# Patient Record
Sex: Male | Born: 1950 | Race: White | Hispanic: No | Marital: Married | Smoking: Former smoker
Health system: Southern US, Community
[De-identification: ages and names within clinical notes are randomized; demographics above are authoritative.]

## PROBLEM LIST (undated history)

## (undated) DIAGNOSIS — M5417 Radiculopathy, lumbosacral region: Secondary | ICD-10-CM

## (undated) DIAGNOSIS — L851 Acquired keratosis [keratoderma] palmaris et plantaris: Secondary | ICD-10-CM

## (undated) DIAGNOSIS — K449 Diaphragmatic hernia without obstruction or gangrene: Secondary | ICD-10-CM

## (undated) DIAGNOSIS — L84 Corns and callosities: Secondary | ICD-10-CM

## (undated) DIAGNOSIS — I951 Orthostatic hypotension: Secondary | ICD-10-CM

## (undated) DIAGNOSIS — K219 Gastro-esophageal reflux disease without esophagitis: Secondary | ICD-10-CM

## (undated) DIAGNOSIS — E119 Type 2 diabetes mellitus without complications: Secondary | ICD-10-CM

## (undated) DIAGNOSIS — R600 Localized edema: Secondary | ICD-10-CM

## (undated) DIAGNOSIS — M545 Low back pain: Secondary | ICD-10-CM

## (undated) DIAGNOSIS — G4733 Obstructive sleep apnea (adult) (pediatric): Secondary | ICD-10-CM

## (undated) DIAGNOSIS — H409 Unspecified glaucoma: Secondary | ICD-10-CM

## (undated) DIAGNOSIS — M1990 Unspecified osteoarthritis, unspecified site: Secondary | ICD-10-CM

## (undated) DIAGNOSIS — E782 Mixed hyperlipidemia: Secondary | ICD-10-CM

## (undated) DIAGNOSIS — I10 Essential (primary) hypertension: Secondary | ICD-10-CM

## (undated) DIAGNOSIS — M47816 Spondylosis without myelopathy or radiculopathy, lumbar region: Secondary | ICD-10-CM

## (undated) DIAGNOSIS — M7020 Olecranon bursitis, unspecified elbow: Secondary | ICD-10-CM

## (undated) DIAGNOSIS — I4819 Other persistent atrial fibrillation: Secondary | ICD-10-CM

## (undated) DIAGNOSIS — I701 Atherosclerosis of renal artery: Secondary | ICD-10-CM

## (undated) DIAGNOSIS — K2270 Barrett's esophagus without dysplasia: Secondary | ICD-10-CM

## (undated) DIAGNOSIS — M5136 Other intervertebral disc degeneration, lumbar region: Secondary | ICD-10-CM

## (undated) DIAGNOSIS — Z79899 Other long term (current) drug therapy: Secondary | ICD-10-CM

## (undated) HISTORY — DX: Spondylosis without myelopathy or radiculopathy, lumbar region: M47.816

## (undated) HISTORY — DX: Corns and callosities: L84

## (undated) HISTORY — DX: Olecranon bursitis, unspecified elbow: M70.20

## (undated) HISTORY — DX: Mixed hyperlipidemia: E78.2

## (undated) HISTORY — DX: Atherosclerosis of renal artery: I70.1

## (undated) HISTORY — DX: Unspecified glaucoma: H40.9

## (undated) HISTORY — DX: Radiculopathy, lumbosacral region: M54.17

## (undated) HISTORY — DX: Low back pain: M54.5

## (undated) HISTORY — DX: Localized edema: R60.0

## (undated) HISTORY — DX: Acquired keratosis (keratoderma) palmaris et plantaris: L85.1

## (undated) HISTORY — DX: Other intervertebral disc degeneration, lumbar region: M51.36

## (undated) HISTORY — DX: Obstructive sleep apnea (adult) (pediatric): G47.33

## (undated) HISTORY — DX: Orthostatic hypotension: I95.1

## (undated) HISTORY — DX: Barrett's esophagus without dysplasia: K22.70

## (undated) HISTORY — DX: Other long term (current) drug therapy: Z79.899

## (undated) HISTORY — DX: Other persistent atrial fibrillation: I48.19

## (undated) HISTORY — DX: Diaphragmatic hernia without obstruction or gangrene: K44.9

---

## 2004-11-17 ENCOUNTER — Ambulatory Visit: Payer: Self-pay | Admitting: Family Medicine

## 2004-11-27 ENCOUNTER — Ambulatory Visit: Payer: Self-pay | Admitting: Family Medicine

## 2004-12-09 ENCOUNTER — Ambulatory Visit: Payer: Self-pay | Admitting: Family Medicine

## 2004-12-31 ENCOUNTER — Ambulatory Visit: Payer: Self-pay | Admitting: Family Medicine

## 2005-01-14 ENCOUNTER — Ambulatory Visit: Payer: Self-pay | Admitting: Family Medicine

## 2005-02-18 ENCOUNTER — Ambulatory Visit: Payer: Self-pay | Admitting: Family Medicine

## 2005-05-28 ENCOUNTER — Ambulatory Visit: Payer: Self-pay | Admitting: Family Medicine

## 2005-07-23 ENCOUNTER — Ambulatory Visit: Payer: Self-pay | Admitting: Family Medicine

## 2005-08-11 ENCOUNTER — Ambulatory Visit: Payer: Self-pay | Admitting: Family Medicine

## 2012-07-22 DIAGNOSIS — M47816 Spondylosis without myelopathy or radiculopathy, lumbar region: Secondary | ICD-10-CM

## 2012-07-22 DIAGNOSIS — M5417 Radiculopathy, lumbosacral region: Secondary | ICD-10-CM | POA: Insufficient documentation

## 2012-07-22 DIAGNOSIS — M545 Low back pain, unspecified: Secondary | ICD-10-CM

## 2012-07-22 DIAGNOSIS — M5136 Other intervertebral disc degeneration, lumbar region: Secondary | ICD-10-CM

## 2012-07-22 DIAGNOSIS — M51369 Other intervertebral disc degeneration, lumbar region without mention of lumbar back pain or lower extremity pain: Secondary | ICD-10-CM

## 2012-07-22 HISTORY — DX: Low back pain, unspecified: M54.50

## 2012-07-22 HISTORY — DX: Radiculopathy, lumbosacral region: M54.17

## 2012-07-22 HISTORY — DX: Other intervertebral disc degeneration, lumbar region: M51.36

## 2012-07-22 HISTORY — DX: Other intervertebral disc degeneration, lumbar region without mention of lumbar back pain or lower extremity pain: M51.369

## 2012-07-22 HISTORY — DX: Spondylosis without myelopathy or radiculopathy, lumbar region: M47.816

## 2012-09-13 ENCOUNTER — Other Ambulatory Visit: Payer: Self-pay | Admitting: Neurosurgery

## 2012-09-13 ENCOUNTER — Encounter (HOSPITAL_COMMUNITY): Payer: Self-pay | Admitting: Pharmacy Technician

## 2012-09-19 ENCOUNTER — Encounter (HOSPITAL_COMMUNITY): Payer: Self-pay

## 2012-09-19 ENCOUNTER — Ambulatory Visit (HOSPITAL_COMMUNITY)
Admission: RE | Admit: 2012-09-19 | Discharge: 2012-09-19 | Disposition: A | Discharge status: Home / Self Care | Payer: PRIVATE HEALTH INSURANCE | Source: Ambulatory Visit | Attending: Neurosurgery | Admitting: Neurosurgery

## 2012-09-19 ENCOUNTER — Encounter (HOSPITAL_COMMUNITY)
Admission: RE | Admit: 2012-09-19 | Discharge: 2012-09-19 | Disposition: A | Discharge status: Home / Self Care | Payer: PRIVATE HEALTH INSURANCE | Source: Ambulatory Visit | Attending: Neurosurgery | Admitting: Neurosurgery

## 2012-09-19 DIAGNOSIS — Z01810 Encounter for preprocedural cardiovascular examination: Secondary | ICD-10-CM | POA: Insufficient documentation

## 2012-09-19 DIAGNOSIS — Z01818 Encounter for other preprocedural examination: Secondary | ICD-10-CM | POA: Insufficient documentation

## 2012-09-19 DIAGNOSIS — I4510 Unspecified right bundle-branch block: Secondary | ICD-10-CM | POA: Insufficient documentation

## 2012-09-19 DIAGNOSIS — Z01812 Encounter for preprocedural laboratory examination: Secondary | ICD-10-CM | POA: Insufficient documentation

## 2012-09-19 DIAGNOSIS — I491 Atrial premature depolarization: Secondary | ICD-10-CM | POA: Insufficient documentation

## 2012-09-19 HISTORY — DX: Essential (primary) hypertension: I10

## 2012-09-19 HISTORY — DX: Type 2 diabetes mellitus without complications: E11.9

## 2012-09-19 HISTORY — DX: Gastro-esophageal reflux disease without esophagitis: K21.9

## 2012-09-19 HISTORY — DX: Unspecified osteoarthritis, unspecified site: M19.90

## 2012-09-19 LAB — CBC
HCT: 43.3 % (ref 39.0–52.0)
MCH: 30.3 pg (ref 26.0–34.0)
MCHC: 33.5 g/dL (ref 30.0–36.0)
MCV: 90.6 fL (ref 78.0–100.0)
Platelets: 228 10*3/uL (ref 150–400)
RDW: 12.5 % (ref 11.5–15.5)
WBC: 8 10*3/uL (ref 4.0–10.5)

## 2012-09-19 LAB — BASIC METABOLIC PANEL
BUN: 15 mg/dL (ref 6–23)
Calcium: 9.6 mg/dL (ref 8.4–10.5)
Chloride: 98 mEq/L (ref 96–112)
Creatinine, Ser: 0.89 mg/dL (ref 0.50–1.35)
GFR calc Af Amer: >90 mL/min (ref >90)

## 2012-09-19 NOTE — Progress Notes (Signed)
Dr Bettina Gavia in [place] called for any past cardiac records

## 2012-09-19 NOTE — Pre-Procedure Instructions (Signed)
KERVIN SWINGLER  09/19/2012   Your procedure is scheduled on:  09/22/12  Report to [facility] at 1030am  Call this number if you have problems the morning of surgery: [phone]   Remember:   Do not eat food or drink liquids after midnight.   Take these medicines the morning of surgery with A SIP OF WATER: norvasc,carvedilol,cardura,hydralazine,prilosec,percocet   Do not wear jewelry, make-up or nail polish.  Do not wear lotions, powders, or perfumes. You may wear deodorant.  Do not shave 48 hours prior to surgery. Men may shave face and neck.  Do not bring valuables to the hospital.  Contacts, dentures or bridgework may not be worn into surgery.  Leave suitcase in the car. After surgery it may be brought to your room.  For patients admitted to the hospital, checkout time is 11:00 AM the day of  discharge.   Patients discharged the day of surgery will not be allowed to drive  home.  Name and phone number of your driver: family  Special Instructions: Shower using CHG 2 nights before surgery and the night before surgery.  If you shower the day of surgery use CHG.  Use special wash - you have one bottle of CHG for all showers.  You should use approximately 1/3 of the bottle for each shower.   Please read over the following fact sheets that you were given: Pain Booklet, Coughing and Deep Breathing, MRSA Information and Surgical Site Infection Prevention

## 2012-09-20 NOTE — Consult Note (Signed)
Anesthesia chart review: Patient is a 62 year old male scheduled for L2-3 laminectomy by Dr. Arnoldo Morale on 09/22/2012. History includes former smoker, obesity, diabetes mellitus type 2, hypertension, GERD, arthritis.  PCP is Dr. Garlon Hatchet.  Cardiologist is Dr. Bettina Gavia ([place]), last visit was on 08/22/12.  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  BP was 164/76 at PAT. Patient denies known history of stress or echo.  Preoperative labs noted.  Glucose 124.  CXR on 09/19/12 showed no acute abnormality.  EKG on 09/19/12 showed SR with PACs, incomplete right BBB. Overall, I think it is stable since 01/28/11.  Patient denies chest pain, SOB.  His activity has been very limited since 06/2012.  Prior to that he could walk up stairs, do yard work, put up [employer] without difficulty.  He got LE edema when started on Lyrica, but this resolved once it was discontinued.  Renal ultrasound on 08/26/11 showed no evidence of significant renal artery stenoses bilaterally, < 60% and no evidence of infrarenal AAA.    I reviewed above with Anesthesiologist Dr. Linna Caprice.  Patient has known HTN and DM, but no known CAD.  He has been asymptomatic and with reasonable activity tolerance prior to back pain in 06/2012.  Patient will be evaluated by his assigned anesthesiologist on the day of surgery.  If no new CV symptoms then would anticipate he could proceed as planned.   Myra Gianotti, PA-C 09/20/12 1640

## 2012-09-21 MED ORDER — CEFAZOLIN SODIUM-DEXTROSE 2-3 GM-% IV SOLR
2.0000 g | INTRAVENOUS | Status: AC
  Administered 2012-09-22: 2 g via INTRAVENOUS
  Filled 2012-09-21: qty 50

## 2012-09-22 ENCOUNTER — Ambulatory Visit (HOSPITAL_COMMUNITY): Payer: PRIVATE HEALTH INSURANCE | Admitting: *Deleted

## 2012-09-22 ENCOUNTER — Ambulatory Visit (HOSPITAL_COMMUNITY): Payer: PRIVATE HEALTH INSURANCE

## 2012-09-22 ENCOUNTER — Encounter (HOSPITAL_COMMUNITY): Payer: Self-pay | Admitting: Vascular Surgery

## 2012-09-22 ENCOUNTER — Encounter (HOSPITAL_COMMUNITY)
Admission: RE | Disposition: A | Discharge status: Home / Self Care | Payer: Self-pay | Source: Ambulatory Visit | Attending: Neurosurgery

## 2012-09-22 ENCOUNTER — Ambulatory Visit (HOSPITAL_COMMUNITY)
Admission: RE | Admit: 2012-09-22 | Discharge: 2012-09-23 | Disposition: A | Discharge status: Home / Self Care | Payer: PRIVATE HEALTH INSURANCE | Source: Ambulatory Visit | Attending: Neurosurgery | Admitting: Neurosurgery

## 2012-09-22 ENCOUNTER — Encounter (HOSPITAL_COMMUNITY): Payer: Self-pay | Admitting: *Deleted

## 2012-09-22 DIAGNOSIS — M5126 Other intervertebral disc displacement, lumbar region: Secondary | ICD-10-CM | POA: Insufficient documentation

## 2012-09-22 DIAGNOSIS — I10 Essential (primary) hypertension: Secondary | ICD-10-CM | POA: Insufficient documentation

## 2012-09-22 DIAGNOSIS — M51379 Other intervertebral disc degeneration, lumbosacral region without mention of lumbar back pain or lower extremity pain: Secondary | ICD-10-CM | POA: Insufficient documentation

## 2012-09-22 DIAGNOSIS — M5137 Other intervertebral disc degeneration, lumbosacral region: Secondary | ICD-10-CM | POA: Insufficient documentation

## 2012-09-22 DIAGNOSIS — M48062 Spinal stenosis, lumbar region with neurogenic claudication: Secondary | ICD-10-CM | POA: Insufficient documentation

## 2012-09-22 DIAGNOSIS — Z23 Encounter for immunization: Secondary | ICD-10-CM | POA: Insufficient documentation

## 2012-09-22 DIAGNOSIS — E119 Type 2 diabetes mellitus without complications: Secondary | ICD-10-CM | POA: Insufficient documentation

## 2012-09-22 HISTORY — PX: LUMBAR LAMINECTOMY/DECOMPRESSION MICRODISCECTOMY: SHX5026

## 2012-09-22 SURGERY — LUMBAR LAMINECTOMY/DECOMPRESSION MICRODISCECTOMY 1 LEVEL
Anesthesia: General | Site: Back | Laterality: Bilateral | Wound class: Clean

## 2012-09-22 MED ORDER — BACITRACIN 50000 UNITS IM SOLR
INTRAMUSCULAR | Status: AC
  Filled 2012-09-22: qty 1

## 2012-09-22 MED ORDER — AMLODIPINE BESYLATE 10 MG PO TABS
10.0000 mg | ORAL_TABLET | Freq: Every day | ORAL | Status: DC
Start: 2012-09-22 — End: 2012-09-23
  Administered 2012-09-22 – 2012-09-23 (×2): 10 mg via ORAL
  Filled 2012-09-22 (×2): qty 1

## 2012-09-22 MED ORDER — METFORMIN HCL 500 MG PO TABS
1000.0000 mg | ORAL_TABLET | Freq: Two times a day (BID) | ORAL | Status: DC
  Administered 2012-09-23: 1000 mg via ORAL
  Filled 2012-09-22 (×3): qty 2

## 2012-09-22 MED ORDER — SPIRONOLACTONE 25 MG PO TABS
25.0000 mg | ORAL_TABLET | Freq: Every day | ORAL | Status: DC
  Administered 2012-09-23: 25 mg via ORAL
  Filled 2012-09-22: qty 1

## 2012-09-22 MED ORDER — ACETAMINOPHEN 325 MG PO TABS: 650.0000 mg | ORAL_TABLET | ORAL | Status: DC | PRN

## 2012-09-22 MED ORDER — ONDANSETRON HCL 4 MG/2ML IJ SOLN
INTRAMUSCULAR | Status: DC | PRN
  Administered 2012-09-22: 4 mg via INTRAVENOUS

## 2012-09-22 MED ORDER — HYDRALAZINE HCL 50 MG PO TABS
50.0000 mg | ORAL_TABLET | Freq: Three times a day (TID) | ORAL | Status: DC
  Administered 2012-09-22 – 2012-09-23 (×2): 50 mg via ORAL
  Filled 2012-09-22 (×4): qty 1

## 2012-09-22 MED ORDER — OXYCODONE-ACETAMINOPHEN 5-325 MG PO TABS: 1.0000 | ORAL_TABLET | ORAL | Status: DC | PRN

## 2012-09-22 MED ORDER — PHENOL 1.4 % MT LIQD: 1.0000 | OROMUCOSAL | Status: DC | PRN

## 2012-09-22 MED ORDER — ONDANSETRON HCL 4 MG/2ML IJ SOLN: 4.0000 mg | INTRAMUSCULAR | Status: DC | PRN

## 2012-09-22 MED ORDER — HYDROCODONE-ACETAMINOPHEN 5-325 MG PO TABS
1.0000 | ORAL_TABLET | ORAL | Status: DC | PRN
  Administered 2012-09-23 (×2): 2 via ORAL
  Filled 2012-09-22 (×2): qty 2

## 2012-09-22 MED ORDER — NEOSTIGMINE METHYLSULFATE 1 MG/ML IJ SOLN
INTRAMUSCULAR | Status: DC | PRN
  Administered 2012-09-22: 5 mg via INTRAVENOUS

## 2012-09-22 MED ORDER — PANTOPRAZOLE SODIUM 40 MG PO TBEC
80.0000 mg | DELAYED_RELEASE_TABLET | Freq: Every day | ORAL | Status: DC
  Administered 2012-09-22 – 2012-09-23 (×2): 80 mg via ORAL
  Filled 2012-09-22 (×2): qty 2

## 2012-09-22 MED ORDER — INSULIN ASPART 100 UNIT/ML ~~LOC~~ SOLN: 0.0000 [IU] | Freq: Every day | SUBCUTANEOUS | Status: DC

## 2012-09-22 MED ORDER — CARVEDILOL 25 MG PO TABS
25.0000 mg | ORAL_TABLET | Freq: Two times a day (BID) | ORAL | Status: DC
  Administered 2012-09-23: 25 mg via ORAL
  Filled 2012-09-22 (×3): qty 1

## 2012-09-22 MED ORDER — CEFAZOLIN SODIUM-DEXTROSE 2-3 GM-% IV SOLR
2.0000 g | Freq: Three times a day (TID) | INTRAVENOUS | Status: AC
  Administered 2012-09-22 – 2012-09-23 (×2): 2 g via INTRAVENOUS
  Filled 2012-09-22 (×2): qty 50

## 2012-09-22 MED ORDER — ZOLPIDEM TARTRATE 5 MG PO TABS: 5.0000 mg | ORAL_TABLET | Freq: Every evening | ORAL | Status: DC | PRN

## 2012-09-22 MED ORDER — HEMOSTATIC AGENTS (NO CHARGE) OPTIME
TOPICAL | Status: DC | PRN
  Administered 2012-09-22: 1 via TOPICAL

## 2012-09-22 MED ORDER — MIDAZOLAM HCL 2 MG/2ML IJ SOLN: 1.0000 mg | INTRAMUSCULAR | Status: DC | PRN

## 2012-09-22 MED ORDER — LACTATED RINGERS IV SOLN: INTRAVENOUS | Status: DC

## 2012-09-22 MED ORDER — BUPIVACAINE-EPINEPHRINE PF 0.5-1:200000 % IJ SOLN
INTRAMUSCULAR | Status: DC | PRN
  Administered 2012-09-22: 20 mL
  Administered 2012-09-22: 10 mL

## 2012-09-22 MED ORDER — PROPOFOL 10 MG/ML IV BOLUS
INTRAVENOUS | Status: DC | PRN
  Administered 2012-09-22: 200 mg via INTRAVENOUS

## 2012-09-22 MED ORDER — MORPHINE SULFATE 2 MG/ML IJ SOLN: 1.0000 mg | INTRAMUSCULAR | Status: DC | PRN

## 2012-09-22 MED ORDER — FENTANYL CITRATE 0.05 MG/ML IJ SOLN
INTRAMUSCULAR | Status: DC | PRN
  Administered 2012-09-22 (×2): 50 ug via INTRAVENOUS
  Administered 2012-09-22: 150 ug via INTRAVENOUS

## 2012-09-22 MED ORDER — COLESEVELAM HCL 3.75 G PO PACK
1.0000 | PACK | Freq: Every day | ORAL | Status: DC
  Administered 2012-09-22: 1 via ORAL
  Filled 2012-09-22 (×2): qty 1

## 2012-09-22 MED ORDER — DIAZEPAM 5 MG PO TABS: 5.0000 mg | ORAL_TABLET | Freq: Four times a day (QID) | ORAL | Status: DC | PRN

## 2012-09-22 MED ORDER — 0.9 % SODIUM CHLORIDE (POUR BTL) OPTIME
TOPICAL | Status: DC | PRN
  Administered 2012-09-22: 1000 mL

## 2012-09-22 MED ORDER — SPIRONOLACTONE-HCTZ 25-25 MG PO TABS: 1.0000 | ORAL_TABLET | Freq: Every day | ORAL | Status: DC

## 2012-09-22 MED ORDER — INSULIN ASPART 100 UNIT/ML ~~LOC~~ SOLN
0.0000 [IU] | Freq: Three times a day (TID) | SUBCUTANEOUS | Status: DC
  Administered 2012-09-23: 4 [IU] via SUBCUTANEOUS

## 2012-09-22 MED ORDER — THROMBIN 5000 UNITS EX SOLR
CUTANEOUS | Status: DC | PRN
  Administered 2012-09-22 (×2): 5000 [IU] via TOPICAL

## 2012-09-22 MED ORDER — PROMETHAZINE HCL 25 MG/ML IJ SOLN: 6.2500 mg | INTRAMUSCULAR | Status: DC | PRN

## 2012-09-22 MED ORDER — ARTIFICIAL TEARS OP OINT
TOPICAL_OINTMENT | OPHTHALMIC | Status: DC | PRN
  Administered 2012-09-22: 1 via OPHTHALMIC

## 2012-09-22 MED ORDER — ACETAMINOPHEN 650 MG RE SUPP: 650.0000 mg | RECTAL | Status: DC | PRN

## 2012-09-22 MED ORDER — OXYCODONE HCL 5 MG PO TABS
5.0000 mg | ORAL_TABLET | Freq: Once | ORAL | Status: AC | PRN
  Administered 2012-09-22: 5 mg via ORAL

## 2012-09-22 MED ORDER — INSULIN GLULISINE 100 UNIT/ML ~~LOC~~ SOLN: 15.0000 [IU] | Freq: Two times a day (BID) | SUBCUTANEOUS | Status: DC

## 2012-09-22 MED ORDER — MUPIROCIN 2 % EX OINT
TOPICAL_OINTMENT | Freq: Two times a day (BID) | CUTANEOUS | Status: DC
  Administered 2012-09-23: via NASAL

## 2012-09-22 MED ORDER — GLYCOPYRROLATE 0.2 MG/ML IJ SOLN
INTRAMUSCULAR | Status: DC | PRN
  Administered 2012-09-22: .8 mg via INTRAVENOUS

## 2012-09-22 MED ORDER — HYDROCHLOROTHIAZIDE 25 MG PO TABS
25.0000 mg | ORAL_TABLET | Freq: Every day | ORAL | Status: DC
  Administered 2012-09-23: 25 mg via ORAL
  Filled 2012-09-22: qty 1

## 2012-09-22 MED ORDER — DOXAZOSIN MESYLATE 2 MG PO TABS
2.0000 mg | ORAL_TABLET | Freq: Every day | ORAL | Status: DC
  Administered 2012-09-22: 2 mg via ORAL
  Filled 2012-09-22 (×2): qty 1

## 2012-09-22 MED ORDER — SODIUM CHLORIDE 0.9 % IR SOLN
Status: DC | PRN
  Administered 2012-09-22: 17:00:00

## 2012-09-22 MED ORDER — FENTANYL CITRATE 0.05 MG/ML IJ SOLN: 50.0000 ug | Freq: Once | INTRAMUSCULAR | Status: DC

## 2012-09-22 MED ORDER — ROCURONIUM BROMIDE 100 MG/10ML IV SOLN
INTRAVENOUS | Status: DC | PRN
  Administered 2012-09-22: 50 mg via INTRAVENOUS

## 2012-09-22 MED ORDER — INSULIN DETEMIR 100 UNIT/ML ~~LOC~~ SOLN
60.0000 [IU] | Freq: Every day | SUBCUTANEOUS | Status: DC
  Administered 2012-09-22 – 2012-09-23 (×2): 60 [IU] via SUBCUTANEOUS
  Filled 2012-09-22: qty 10

## 2012-09-22 MED ORDER — INSULIN ASPART 100 UNIT/ML ~~LOC~~ SOLN: 0.0000 [IU] | Freq: Three times a day (TID) | SUBCUTANEOUS | Status: DC

## 2012-09-22 MED ORDER — HYDROMORPHONE HCL PF 1 MG/ML IJ SOLN
0.2500 mg | INTRAMUSCULAR | Status: DC | PRN
  Administered 2012-09-22 (×2): 0.5 mg via INTRAVENOUS

## 2012-09-22 MED ORDER — LACTATED RINGERS IV SOLN
INTRAVENOUS | Status: DC | PRN
  Administered 2012-09-22 (×2): via INTRAVENOUS

## 2012-09-22 MED ORDER — HYDROMORPHONE HCL PF 1 MG/ML IJ SOLN
INTRAMUSCULAR | Status: AC
  Filled 2012-09-22: qty 1

## 2012-09-22 MED ORDER — SODIUM CHLORIDE 0.9 % IV SOLN
INTRAVENOUS | Status: AC
  Filled 2012-09-22: qty 500

## 2012-09-22 MED ORDER — OXYCODONE-ACETAMINOPHEN 10-325 MG PO TABS: 1.0000 | ORAL_TABLET | ORAL | Status: DC | PRN

## 2012-09-22 MED ORDER — VECURONIUM BROMIDE 10 MG IV SOLR
INTRAVENOUS | Status: DC | PRN
  Administered 2012-09-22: 1 mg via INTRAVENOUS
  Administered 2012-09-22: 2 mg via INTRAVENOUS

## 2012-09-22 MED ORDER — INSULIN ASPART 100 UNIT/ML ~~LOC~~ SOLN: 0.0000 [IU] | SUBCUTANEOUS | Status: DC

## 2012-09-22 MED ORDER — MIDAZOLAM HCL 5 MG/5ML IJ SOLN
INTRAMUSCULAR | Status: DC | PRN
  Administered 2012-09-22: 2 mg via INTRAVENOUS

## 2012-09-22 MED ORDER — LIDOCAINE HCL (CARDIAC) 20 MG/ML IV SOLN
INTRAVENOUS | Status: DC | PRN
  Administered 2012-09-22: 100 mg via INTRAVENOUS

## 2012-09-22 MED ORDER — PNEUMOCOCCAL VAC POLYVALENT 25 MCG/0.5ML IJ INJ
0.5000 mL | INJECTION | INTRAMUSCULAR | Status: AC
  Administered 2012-09-23: 0.5 mL via INTRAMUSCULAR
  Filled 2012-09-22: qty 0.5

## 2012-09-22 MED ORDER — OXYCODONE HCL 5 MG PO TABS
ORAL_TABLET | ORAL | Status: AC
  Filled 2012-09-22: qty 1

## 2012-09-22 MED ORDER — INSULIN ASPART 100 UNIT/ML ~~LOC~~ SOLN
15.0000 [IU] | Freq: Two times a day (BID) | SUBCUTANEOUS | Status: DC
  Administered 2012-09-23: 15 [IU] via SUBCUTANEOUS

## 2012-09-22 MED ORDER — BACITRACIN ZINC 500 UNIT/GM EX OINT
TOPICAL_OINTMENT | CUTANEOUS | Status: DC | PRN
  Administered 2012-09-22: 1 via TOPICAL

## 2012-09-22 MED ORDER — OXYCODONE HCL 5 MG/5ML PO SOLN: 5.0000 mg | Freq: Once | ORAL | Status: AC | PRN

## 2012-09-22 MED ORDER — DOCUSATE SODIUM 100 MG PO CAPS
100.0000 mg | ORAL_CAPSULE | Freq: Two times a day (BID) | ORAL | Status: DC
  Administered 2012-09-22 – 2012-09-23 (×2): 100 mg via ORAL
  Filled 2012-09-22 (×2): qty 1

## 2012-09-22 MED ORDER — MENTHOL 3 MG MT LOZG: 1.0000 | LOZENGE | OROMUCOSAL | Status: DC | PRN

## 2012-09-22 MED ORDER — SUCCINYLCHOLINE CHLORIDE 20 MG/ML IJ SOLN
INTRAMUSCULAR | Status: DC | PRN
  Administered 2012-09-22: 100 mg via INTRAVENOUS

## 2012-09-22 MED ORDER — OXYCODONE HCL 5 MG PO TABS: 5.0000 mg | ORAL_TABLET | ORAL | Status: DC | PRN

## 2012-09-22 SURGICAL SUPPLY — 58 items
BAG DECANTER FOR FLEXI CONT (MISCELLANEOUS) ×2 IMPLANT
BENZOIN TINCTURE PRP APPL 2/3 (GAUZE/BANDAGES/DRESSINGS) ×2 IMPLANT
BLADE SURG ROTATE 9660 (MISCELLANEOUS) ×2 IMPLANT
BRUSH SCRUB EZ PLAIN DRY (MISCELLANEOUS) ×2 IMPLANT
BUR ACORN 6.0 (BURR) ×2 IMPLANT
BUR MATCHSTICK NEURO 3.0 LAGG (BURR) ×2 IMPLANT
CANISTER SUCTION 2500CC (MISCELLANEOUS) ×2 IMPLANT
CLOTH BEACON ORANGE TIMEOUT ST (SAFETY) ×2 IMPLANT
CONT SPEC 4OZ CLIKSEAL STRL BL (MISCELLANEOUS) ×2 IMPLANT
DRAPE LAPAROTOMY 100X72X124 (DRAPES) ×2 IMPLANT
DRAPE MICROSCOPE LEICA (MISCELLANEOUS) ×2 IMPLANT
DRAPE POUCH INSTRU U-SHP 10X18 (DRAPES) ×2 IMPLANT
DRAPE SURG 17X23 STRL (DRAPES) ×8 IMPLANT
ELECT BLADE 4.0 EZ CLEAN MEGAD (MISCELLANEOUS) ×4
ELECT REM PT RETURN 9FT ADLT (ELECTROSURGICAL) ×2
ELECTRODE BLDE 4.0 EZ CLN MEGD (MISCELLANEOUS) ×2 IMPLANT
ELECTRODE REM PT RTRN 9FT ADLT (ELECTROSURGICAL) ×1 IMPLANT
GAUZE SPONGE 4X4 16PLY XRAY LF (GAUZE/BANDAGES/DRESSINGS) IMPLANT
GLOVE BIO SURGEON STRL SZ8 (GLOVE) IMPLANT
GLOVE BIO SURGEON STRL SZ8.5 (GLOVE) ×2 IMPLANT
GLOVE BIOGEL PI IND STRL 7.0 (GLOVE) ×1 IMPLANT
GLOVE BIOGEL PI IND STRL 8.5 (GLOVE) ×1 IMPLANT
GLOVE BIOGEL PI INDICATOR 7.0 (GLOVE) ×1
GLOVE BIOGEL PI INDICATOR 8.5 (GLOVE) ×1
GLOVE ECLIPSE 8.5 STRL (GLOVE) ×2 IMPLANT
GLOVE EXAM NITRILE LRG STRL (GLOVE) IMPLANT
GLOVE EXAM NITRILE MD LF STRL (GLOVE) ×2 IMPLANT
GLOVE EXAM NITRILE XL STR (GLOVE) IMPLANT
GLOVE EXAM NITRILE XS STR PU (GLOVE) IMPLANT
GLOVE SS BIOGEL STRL SZ 8 (GLOVE) ×1 IMPLANT
GLOVE SUPERSENSE BIOGEL SZ 8 (GLOVE) ×1
GLOVE SURG SS PI 7.0 STRL IVOR (GLOVE) ×4 IMPLANT
GOWN BRE IMP SLV AUR LG STRL (GOWN DISPOSABLE) IMPLANT
GOWN BRE IMP SLV AUR XL STRL (GOWN DISPOSABLE) ×4 IMPLANT
GOWN STRL REIN 2XL LVL4 (GOWN DISPOSABLE) ×2 IMPLANT
KIT BASIN OR (CUSTOM PROCEDURE TRAY) ×2 IMPLANT
KIT ROOM TURNOVER OR (KITS) ×2 IMPLANT
NEEDLE HYPO 21X1.5 SAFETY (NEEDLE) IMPLANT
NEEDLE HYPO 22GX1.5 SAFETY (NEEDLE) ×2 IMPLANT
NEEDLE SPNL 20GX3.5 QUINCKE YW (NEEDLE) ×2 IMPLANT
NS IRRIG 1000ML POUR BTL (IV SOLUTION) ×2 IMPLANT
PACK LAMINECTOMY NEURO (CUSTOM PROCEDURE TRAY) ×2 IMPLANT
PAD ARMBOARD 7.5X6 YLW CONV (MISCELLANEOUS) ×8 IMPLANT
PATTIES SURGICAL .5 X1 (DISPOSABLE) IMPLANT
PENCIL BUTTON HOLSTER BLD 10FT (ELECTRODE) ×2 IMPLANT
RUBBERBAND STERILE (MISCELLANEOUS) ×4 IMPLANT
SPONGE GAUZE 4X4 12PLY (GAUZE/BANDAGES/DRESSINGS) ×2 IMPLANT
SPONGE SURGIFOAM ABS GEL SZ50 (HEMOSTASIS) ×2 IMPLANT
STRIP CLOSURE SKIN 1/2X4 (GAUZE/BANDAGES/DRESSINGS) ×2 IMPLANT
SUT VIC AB 1 CT1 18XBRD ANBCTR (SUTURE) ×1 IMPLANT
SUT VIC AB 1 CT1 8-18 (SUTURE) ×1
SUT VIC AB 2-0 CP2 18 (SUTURE) ×2 IMPLANT
SYR 20CC LL (SYRINGE) IMPLANT
SYR 20ML ECCENTRIC (SYRINGE) ×2 IMPLANT
TAPE CLOTH SURG 4X10 WHT LF (GAUZE/BANDAGES/DRESSINGS) ×2 IMPLANT
TOWEL OR 17X24 6PK STRL BLUE (TOWEL DISPOSABLE) ×2 IMPLANT
TOWEL OR 17X26 10 PK STRL BLUE (TOWEL DISPOSABLE) ×2 IMPLANT
WATER STERILE IRR 1000ML POUR (IV SOLUTION) ×2 IMPLANT

## 2012-09-22 NOTE — Progress Notes (Signed)
Patient ID: Louis Neal, male   DOB: 1951-03-11, 62 y.o.   MRN: [id number] Subjective:  The patient is alert and pleasant. He is in no apparent distress. He looks well.  Objective: Vital signs in last 24 hours: Temp:  [98.4 F (36.9 C)] 98.4 F (36.9 C) (02/20 1745) Pulse Rate:  [81-88] 81 (02/20 1800) Resp:  [21-23] 21 (02/20 1800) BP: (113-154)/(45-55) 113/45 mmHg (02/20 1800) SpO2:  [94 %-96 %] 96 % (02/20 1800)  Intake/Output from previous day:   Intake/Output this shift: Total I/O In: 1000 [I.V.:1000] Out: 50 [Blood:50]  Physical exam patient is alert and pleasant. He is moving his lower extremities well.  Lab Results: No results found for this basename: WBC, HGB, HCT, PLT,  in the last 72 hours BMET No results found for this basename: NA, K, CL, CO2, GLUCOSE, BUN, CREATININE, CALCIUM,  in the last 72 hours  Studies/Results: Dg Lumbar Spine 2-3 Views  09/22/2012  *RADIOLOGY REPORT*  Clinical Data: L2-3 laminectomy and discectomy.  LUMBAR SPINE - 2-3 VIEW  Comparison: MRI lumbar spine 07/08/2012.  Findings: Two intraoperative views lumbar spine in the lateral projection are provided.  Of the normal plain film labeled #1, metallic device projects beyond the L1 vertebral body. This may be external to the patient. On film labeled #2, a probe is at the level of the L2-3 facets.  IMPRESSION: L2-3 localization.   Original Report Authenticated By: Orlean Patten, M.D.     Assessment/Plan: Patient is doing well.  LOS: 0 days     Hammad Finkler D 09/22/2012, 6:00 PM

## 2012-09-22 NOTE — Preoperative (Signed)
Beta Blockers   Reason not to administer Beta Blockers:Not Applicable 

## 2012-09-22 NOTE — Anesthesia Postprocedure Evaluation (Signed)
Anesthesia Post Note  Patient: Louis Neal  Procedure(s) Performed: Procedure(s) (LRB): LUMBAR LAMINECTOMY/DECOMPRESSION MICRODISCECTOMY 1 LEVEL (Bilateral)  Anesthesia type: general  Patient location: PACU  Post pain: Pain level controlled  Post assessment: Patient's Cardiovascular Status Stable  Last Vitals:  Filed Vitals:   09/22/12 1900  BP: 129/46  Pulse: 79  Temp: 36.4 C  Resp: 20    Post vital signs: Reviewed and stable  Level of consciousness: sedated  Complications: No apparent anesthesia complications

## 2012-09-22 NOTE — Op Note (Signed)
Brief history: The patient is a 62 year old white male who has complained of back and bilateral leg pain, left greater than right. He has failed medical management and was worked up with a lumbar MRI. This demonstrated a large herniated discs and severe stenosis at L2-3. I discussed the various treatment options with the patient including surgery. The patient has weighed the risks, benefits, and alternatives surgery and decided to proceed with a bilateral L2-3 discectomy/laminotomy/foraminotomy.  Preoperative diagnosis: L2-3 herniated disc, L2-3 spinal stenosis lumbago lumbar radiculopathy degenerative disease  Postoperative diagnosis: The same  Procedure: Bilateral L2-3 Intervertebral discectomy using micro-dissection  Surgeon: Dr. Earle Gell  Asst.: Dr. Kristeen Miss  Anesthesia: Gen. endotracheal  Estimated blood loss: 25 cc  Drains: None  Complications: None  Description of procedure: The patient was brought to the operating room by the anesthesia team. General endotracheal anesthesia was induced. The patient was turned to the prone position on the Wilson frame. The patient's lumbosacral region was then prepared with Betadine scrub and Betadine solution. Sterile drapes were applied.  I then injected the area to be incised with Marcaine with epinephrine solution. I then used a scalpel to make a linear midline incision over the L2-3 intervertebral disc space. I then used electrocautery to perform a bilateral subperiosteal dissection exposing the spinous process and lamina of L2 and L3. We obtained intraoperative radiograph to confirm our location. I then inserted the [facility] retractor for exposure.  We then brought the operative microscope into the field. Under its magnification and illumination we completed the microdissection. I used a high-speed drill to perform a laminotomy at L2 bilaterally. I then used a Kerrison punches to widen the laminotomy and removed the ligamentum flavum at  L2-3 and removed the cephalad aspect of the L3 lamina, perform a foraminotomy about the bilateral L3 nerve roots.. We then used microdissection to free up the thecal sac and the bilateral L3 nerve root from the epidural tissue. I then used a Kerrison punch to perform a foraminotomy at about the bilateral L3 nerve root. We then using the nerve root retractor to gently retract the thecal sac and the L3 nerve root medially. This exposed the central intervertebral disc. We identified the ruptured disc and remove it with the pituitary forceps.  I then palpated along the ventral surface of the thecal sac and along exit route of the bilateral L3 nerve root and noted that the neural structures were well decompressed. This completed the decompression.  We then obtained hemostasis using bipolar electrocautery. We irrigated the wound out with bacitracin solution. We then removed the retractor. We then reapproximated the patient's thoracolumbar fascia with interrupted #1 Vicryl suture. We then reapproximated the patient's subcutaneous tissue with interrupted 3-0 Vicryl suture. We then reapproximated patient's skin with Steri-Strips and benzoin. The was then coated with bacitracin ointment. The drapes were removed. The patient was subsequently returned to the supine position where they were extubated by the anesthesia team. The patient was then transported to the postanesthesia care unit in stable condition. All sponge instrument and needle counts were reportedly correct at the end of this case.

## 2012-09-22 NOTE — H&P (Signed)
Subjective: The patient is a 62 year old white male who has complained of back and bilateral leg pain, left greater than right. He has failed medical management and was worked up with a lumbar MRI. This demonstrated a herniated disc and spinal stenosis at L2-3. I discussed the various treatment options with the patient including surgery. The patient has weighed the risks, benefits, and alternatives surgery and decided proceed with a L2-3 laminectomy and discectomy.   Past Medical History  Diagnosis Date  . Diabetes mellitus without complication   . GERD (gastroesophageal reflux disease)   . Arthritis   . Hypertension     dr Bettina Gavia  in [place]    Past Surgical History  Procedure Laterality Date  . No past surgeries      No Known Allergies  History  Substance Use Topics  . Smoking status: Former Research scientist (life sciences)  . Smokeless tobacco: Not on file  . Alcohol Use: No    No family history on file. Prior to Admission medications   Medication Sig Start Date End Date Taking? Authorizing Provider  amLODipine (NORVASC) 10 MG tablet Take 10 mg by mouth daily.   Yes Historical Provider, MD  aspirin EC 81 MG tablet Take 81 mg by mouth daily.   Yes Historical Provider, MD  carvedilol (COREG) 25 MG tablet Take 25 mg by mouth 2 (two) times daily with a meal.   Yes Historical Provider, MD  Colesevelam HCl 3.75 G PACK Take 1 packet by mouth daily.   Yes Historical Provider, MD  doxazosin (CARDURA) 2 MG tablet Take 2 mg by mouth at bedtime.   Yes Historical Provider, MD  hydrALAZINE (APRESOLINE) 50 MG tablet Take 50 mg by mouth 3 (three) times daily.   Yes Historical Provider, MD  insulin detemir (LEVEMIR) 100 UNIT/ML injection Inject 60 Units into the skin daily.   Yes Historical Provider, MD  insulin glulisine (APIDRA SOLOSTAR) 100 UNIT/ML injection Inject 15 Units into the skin 2 (two) times daily. With breakfast and supper   Yes Historical Provider, MD  metFORMIN (GLUCOPHAGE) 1000 MG tablet Take 1,000 mg  by mouth 2 (two) times daily with a meal.   Yes Historical Provider, MD  omeprazole (PRILOSEC) 40 MG capsule Take 40 mg by mouth daily.   Yes Historical Provider, MD  oxyCODONE-acetaminophen (PERCOCET) 10-325 MG per tablet Take 1 tablet by mouth every 8 (eight) hours as needed for pain. Pain   Yes Historical Provider, MD  spironolactone-hydrochlorothiazide (ALDACTAZIDE) 25-25 MG per tablet Take 1 tablet by mouth daily.   Yes Historical Provider, MD  urea (CARMOL) 40 % CREA Apply 1 application topically daily as needed. To feet for dry skin    Historical Provider, MD     Review of Systems  Positive ROS: As above  All other systems have been reviewed and were otherwise negative with the exception of those mentioned in the HPI and as above.  Objective: Vital signs in last 24 hours:    General Appearance: Alert, cooperative, no distress, appears stated age Head: Normocephalic, without obvious abnormality, atraumatic Eyes: PERRL, conjunctiva/corneas clear, EOM's intact, fundi benign, both eyes      Ears: Normal TM's and external ear canals, both ears Throat: Lips, mucosa, and tongue normal; teeth and gums normal Neck: Supple, symmetrical, trachea midline, no adenopathy; thyroid: No enlargement/tenderness/nodules; no carotid bruit or JVD Back: Symmetric, no curvature, ROM normal, no CVA tenderness Lungs: Clear to auscultation bilaterally, respirations unlabored Heart: Regular rate and rhythm, S1 and S2 normal, no murmur, rub or  gallop Abdomen: Soft, non-tender, bowel sounds active all four quadrants, no masses, no organomegaly Extremities: Extremities normal, atraumatic, no cyanosis or edema Pulses: 2+ and symmetric all extremities Skin: Skin color, texture, turgor normal, no rashes or lesions  NEUROLOGIC:   Mental status: alert and oriented, no aphasia, good attention span, Fund of knowledge/ memory ok Motor Exam - grossly normal Sensory Exam - grossly normal Reflexes:  Coordination -  grossly normal Gait - grossly normal Balance - grossly normal Cranial Nerves: I: smell Not tested  II: visual acuity  OS: Normal    OD: Normal   II: visual fields Full to confrontation  II: pupils Equal, round, reactive to light  III,VII: ptosis None  III,IV,VI: extraocular muscles  Full ROM  V: mastication Normal  V: facial light touch sensation  Normal  V,VII: corneal reflex  Present  VII: facial muscle function - upper  Normal  VII: facial muscle function - lower Normal  VIII: hearing Not tested  IX: soft palate elevation  Normal  IX,X: gag reflex Present  XI: trapezius strength  5/5  XI: sternocleidomastoid strength 5/5  XI: neck flexion strength  5/5  XII: tongue strength  Normal    Data Review Lab Results  Component Value Date   WBC 8.0 09/19/2012   HGB 14.5 09/19/2012   HCT 43.3 09/19/2012   MCV 90.6 09/19/2012   PLT 228 09/19/2012   Lab Results  Component Value Date   NA 139 09/19/2012   K 3.7 09/19/2012   CL 98 09/19/2012   CO2 28 09/19/2012   BUN 15 09/19/2012   CREATININE 0.89 09/19/2012   GLUCOSE 124* 09/19/2012   No results found for this basename: INR, PROTIME    Assessment/Plan: L2-3 spinal stenosis, herniated disc, lumbago, lumbar radiculopathy: I discussed situation with the patient and his wife. I reviewed his MRI and pointed out and pointed out the abnormalities. We have discussed the various treatment options including surgery. I have described the surgical option of an L2-3 laminectomy with discectomy. I described the surgery to him. I've shown surgical models. I discussed the risks, benefits, alternatives, and likelihood of achieving our goals with surgery. I've answered all the patient and his wife's questions. The patient has decided proceed with surgery.   Ophelia Charter 09/22/2012 2:47 PM

## 2012-09-22 NOTE — Anesthesia Preprocedure Evaluation (Addendum)
Anesthesia Evaluation  Patient identified by MRN, date of birth, ID band Patient awake    Reviewed: Allergy & Precautions, H&P , NPO status , Patient's Chart, lab work & pertinent test results  Airway Mallampati: II TM Distance: >3 FB Neck ROM: Full    Dental  (+) Caps and Dental Advisory Given   Pulmonary former smoker,  breath sounds clear to auscultation        Cardiovascular hypertension, Rhythm:Regular Rate:Normal     Neuro/Psych    GI/Hepatic GERD-  ,  Endo/Other  diabetes  Renal/GU      Musculoskeletal   Abdominal (+) + obese,   Peds  Hematology   Anesthesia Other Findings   Reproductive/Obstetrics                          Anesthesia Physical Anesthesia Plan  ASA: III  Anesthesia Plan: General   Post-op Pain Management:    Induction: Intravenous  Airway Management Planned: Oral ETT  Additional Equipment:   Intra-op Plan:   Post-operative Plan: Extubation in OR  Informed Consent: I have reviewed the patients History and Physical, chart, labs and discussed the procedure including the risks, benefits and alternatives for the proposed anesthesia with the patient or authorized representative who has indicated his/her understanding and acceptance.     Plan Discussed with: CRNA and Surgeon  Anesthesia Plan Comments:         Anesthesia Quick Evaluation

## 2012-09-22 NOTE — Transfer of Care (Signed)
Immediate Anesthesia Transfer of Care Note  Patient: Louis Neal  Procedure(s) Performed: Procedure(s) with comments: LUMBAR LAMINECTOMY/DECOMPRESSION MICRODISCECTOMY 1 LEVEL (Bilateral) - Lumbar two-three laminectomy  Patient Location: PACU  Anesthesia Type:General  Level of Consciousness: patient cooperative and responds to stimulation  Airway & Oxygen Therapy: Patient Spontanous Breathing and Patient connected to nasal cannula oxygen  Post-op Assessment: Report given to PACU RN, Post -op Vital signs reviewed and stable and Patient moving all extremities X 4  Post vital signs: Reviewed and stable  Complications: No apparent anesthesia complications

## 2012-09-23 ENCOUNTER — Encounter (HOSPITAL_COMMUNITY): Payer: Self-pay | Admitting: Neurosurgery

## 2012-09-23 LAB — GLUCOSE, CAPILLARY
Glucose-Capillary: 183 mg/dL — ABNORMAL HIGH (ref 70–99)
Glucose-Capillary: 153 mg/dL — ABNORMAL HIGH (ref 70–99)

## 2012-09-23 MED ORDER — OXYCODONE-ACETAMINOPHEN 10-325 MG PO TABS: 1.0000 | ORAL_TABLET | ORAL | Status: DC | PRN

## 2012-09-23 MED ORDER — DIAZEPAM 5 MG PO TABS: 5.0000 mg | ORAL_TABLET | Freq: Four times a day (QID) | ORAL | Status: DC | PRN

## 2012-09-23 MED ORDER — MUPIROCIN 2 % EX OINT
1.0000 "application " | TOPICAL_OINTMENT | Freq: Two times a day (BID) | CUTANEOUS | Status: DC
  Administered 2012-09-23: 1 via NASAL

## 2012-09-23 MED ORDER — CHLORHEXIDINE GLUCONATE CLOTH 2 % EX PADS
6.0000 | MEDICATED_PAD | Freq: Every day | CUTANEOUS | Status: DC
  Administered 2012-09-23: 6 via TOPICAL

## 2012-09-23 NOTE — Discharge Summary (Signed)
Physician Discharge Summary  Patient ID: Louis Neal MRN: [id number] DOB/AGE: 64-Sep-1952 64 y.o.  Admit date: 09/22/2012 Discharge date: 09/23/2012  Admission Diagnoses: L2-3 spinal stenosis,  Disc degeneration,herniated disc, lumbago, lumbar radiculopathy, neurogenic claudication  Discharge Diagnoses: The same Active Problems:   * No active hospital problems. *   Discharged Condition: good  Hospital Course: On 09/22/2012 I performed an bilateral L2-3 laminectomy and discectomy. The surgery went well.  The patient's postoperative course was unremarkable. On postop day #1 the patient felt much better and wanted to be discharged to home. The patient was given oral and written discharge instructions. All his questions were answered.  Consults: None Significant Diagnostic Studies: None Treatments: Bilateral L2-3 discectomy using microdissection. Discharge Exam: Blood pressure 176/84, pulse 96, temperature 99.6 F (37.6 C), resp. rate 18, SpO2 97.00%. The patient is alert and oriented. His lower strumming strength is grossly normal. His dressing is clean and dry. He looks well.  Disposition: Home  Discharge Orders   Future Orders Complete By Expires     Call MD for:  difficulty breathing, headache or visual disturbances  As directed     Call MD for:  extreme fatigue  As directed     Call MD for:  hives  As directed     Call MD for:  persistant dizziness or light-headedness  As directed     Call MD for:  persistant nausea and vomiting  As directed     Call MD for:  redness, tenderness, or signs of infection (pain, swelling, redness, odor or green/yellow discharge around incision site)  As directed     Call MD for:  severe uncontrolled pain  As directed     Call MD for:  temperature >100.4  As directed     Diet - low sodium heart healthy  As directed     Discharge instructions  As directed     Comments:      Call [phone] for a followup appointment.    Increase activity  slowly  As directed     Remove dressing in 48 hours  As directed         Medication List    TAKE these medications       amLODipine 10 MG tablet  Commonly known as:  NORVASC  Take 10 mg by mouth daily.     APIDRA SOLOSTAR 100 UNIT/ML injection  Generic drug:  insulin glulisine  Inject 15 Units into the skin 2 (two) times daily. With breakfast and supper     aspirin EC 81 MG tablet  Take 81 mg by mouth daily.     carvedilol 25 MG tablet  Commonly known as:  COREG  Take 25 mg by mouth 2 (two) times daily with a meal.     Colesevelam HCl 3.75 G Pack  Take 1 packet by mouth daily.     diazepam 5 MG tablet  Commonly known as:  VALIUM  Take 1 tablet (5 mg total) by mouth every 6 (six) hours as needed.     doxazosin 2 MG tablet  Commonly known as:  CARDURA  Take 2 mg by mouth at bedtime.     hydrALAZINE 50 MG tablet  Commonly known as:  APRESOLINE  Take 50 mg by mouth 3 (three) times daily.     insulin detemir 100 UNIT/ML injection  Commonly known as:  LEVEMIR  Inject 60 Units into the skin daily.     metFORMIN 1000 MG tablet  Commonly known as:  GLUCOPHAGE  Take 1,000 mg by mouth 2 (two) times daily with a meal.     omeprazole 40 MG capsule  Commonly known as:  PRILOSEC  Take 40 mg by mouth daily.     oxyCODONE-acetaminophen 10-325 MG per tablet  Commonly known as:  PERCOCET  Take 1 tablet by mouth every 4 (four) hours as needed for pain.     oxyCODONE-acetaminophen 10-325 MG per tablet  Commonly known as:  PERCOCET  Take 1 tablet by mouth every 8 (eight) hours as needed for pain. Pain     spironolactone-hydrochlorothiazide 25-25 MG per tablet  Commonly known as:  ALDACTAZIDE  Take 1 tablet by mouth daily.     urea 40 % Crea  Commonly known as:  CARMOL  Apply 1 application topically daily as needed. To feet for dry skin         Signed: Ophelia Charter 09/23/2012, 12:12 PM

## 2012-09-23 NOTE — Progress Notes (Signed)
Pt given D/C instructions with Rx's. Pt verbalized understanding of teaching, all questions were answered. Pt D/C'd home via wheelchair @ 1350 per MD order. Holli Humbles, RN

## 2013-12-20 IMAGING — CR DG CHEST 2V
2 series · 2 of 2 positions shown · non-contrast
Comparison: None.

CLINICAL DATA: Preop lumbar laminectomy

CHEST - 2 VIEW

[view not recorded (1 of 2)]
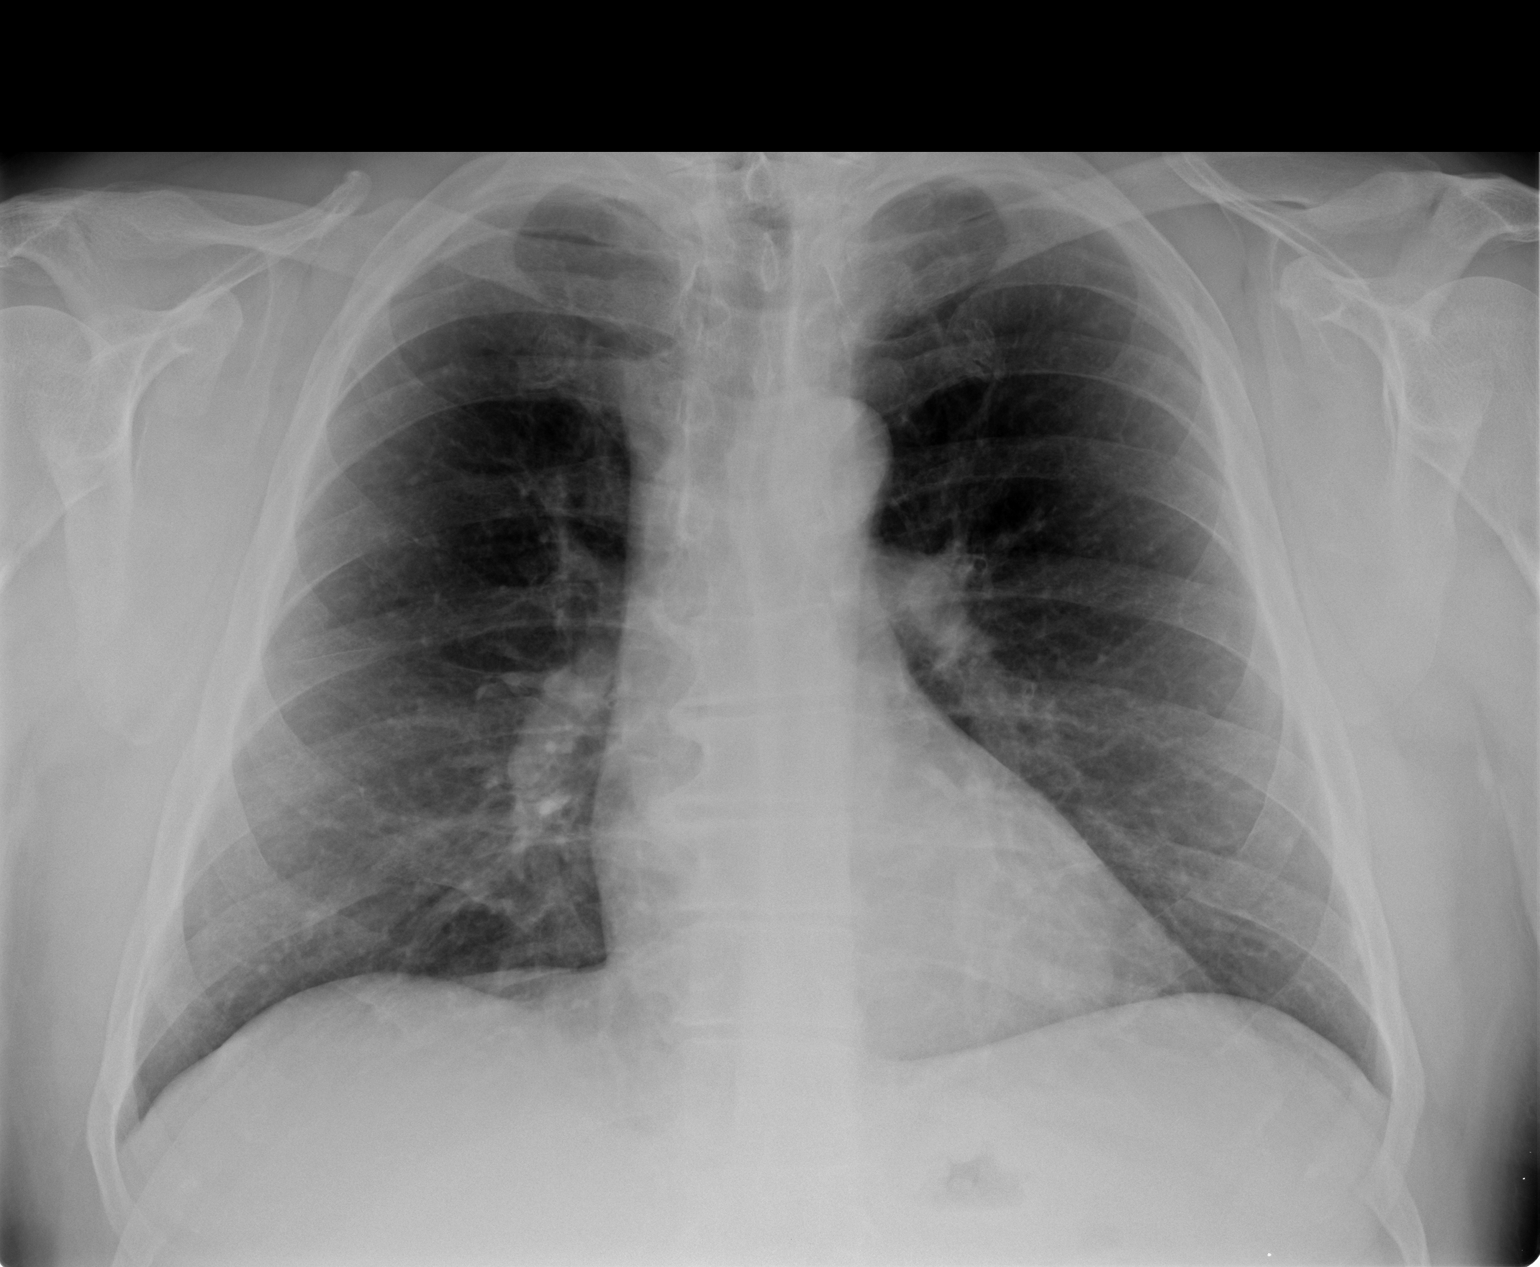

[view not recorded (2 of 2)]
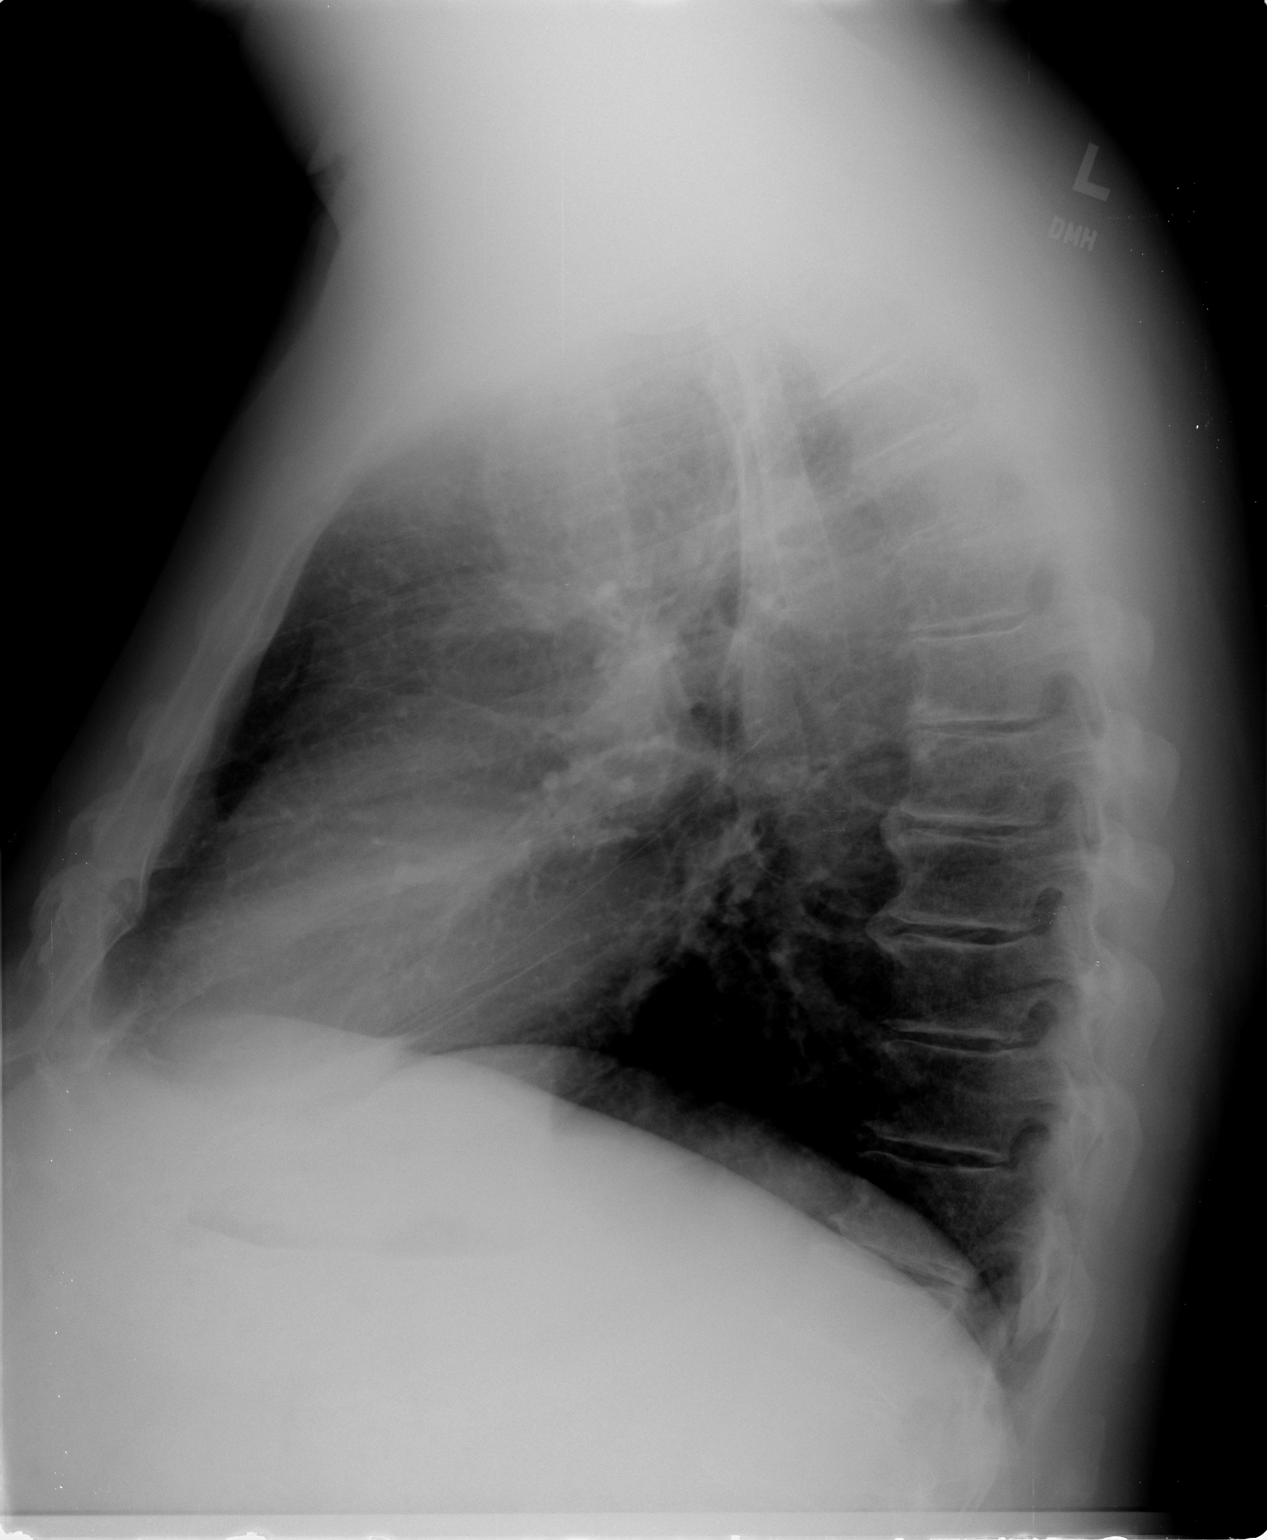

[2 of 2 positions shown; findings below may reference images not displayed]

FINDINGS: Heart size is normal and the vascularity is normal.  Lung
volume is normal.  Lungs are clear without infiltrate or mass
lesion.
IMPRESSION: No acute abnormality.

## 2015-10-17 DIAGNOSIS — L84 Corns and callosities: Secondary | ICD-10-CM

## 2015-10-17 HISTORY — DX: Corns and callosities: L84

## 2015-11-28 DIAGNOSIS — K219 Gastro-esophageal reflux disease without esophagitis: Secondary | ICD-10-CM

## 2015-11-28 HISTORY — DX: Gastro-esophageal reflux disease without esophagitis: K21.9

## 2016-01-14 DIAGNOSIS — I951 Orthostatic hypotension: Secondary | ICD-10-CM

## 2016-01-14 DIAGNOSIS — Z79899 Other long term (current) drug therapy: Secondary | ICD-10-CM

## 2016-01-14 DIAGNOSIS — I10 Essential (primary) hypertension: Secondary | ICD-10-CM

## 2016-01-14 DIAGNOSIS — I110 Hypertensive heart disease with heart failure: Secondary | ICD-10-CM | POA: Insufficient documentation

## 2016-01-14 DIAGNOSIS — I959 Hypotension, unspecified: Secondary | ICD-10-CM

## 2016-01-14 HISTORY — DX: Hypotension, unspecified: I95.9

## 2016-01-14 HISTORY — DX: Essential (primary) hypertension: I10

## 2016-01-14 HISTORY — DX: Hypertensive heart disease with heart failure: I11.0

## 2016-01-14 HISTORY — DX: Orthostatic hypotension: I95.1

## 2016-01-14 HISTORY — DX: Other long term (current) drug therapy: Z79.899

## 2016-02-28 DIAGNOSIS — H409 Unspecified glaucoma: Secondary | ICD-10-CM

## 2016-02-28 DIAGNOSIS — R600 Localized edema: Secondary | ICD-10-CM

## 2016-02-28 DIAGNOSIS — G4733 Obstructive sleep apnea (adult) (pediatric): Secondary | ICD-10-CM | POA: Insufficient documentation

## 2016-02-28 DIAGNOSIS — Z794 Long term (current) use of insulin: Secondary | ICD-10-CM

## 2016-02-28 DIAGNOSIS — K2270 Barrett's esophagus without dysplasia: Secondary | ICD-10-CM | POA: Insufficient documentation

## 2016-02-28 DIAGNOSIS — Z9989 Dependence on other enabling machines and devices: Secondary | ICD-10-CM

## 2016-02-28 DIAGNOSIS — E119 Type 2 diabetes mellitus without complications: Secondary | ICD-10-CM | POA: Insufficient documentation

## 2016-02-28 DIAGNOSIS — E782 Mixed hyperlipidemia: Secondary | ICD-10-CM

## 2016-02-28 DIAGNOSIS — I701 Atherosclerosis of renal artery: Secondary | ICD-10-CM | POA: Insufficient documentation

## 2016-02-28 DIAGNOSIS — E118 Type 2 diabetes mellitus with unspecified complications: Secondary | ICD-10-CM | POA: Insufficient documentation

## 2016-02-28 HISTORY — DX: Type 2 diabetes mellitus without complications: E11.9

## 2016-02-28 HISTORY — DX: Barrett's esophagus without dysplasia: K22.70

## 2016-02-28 HISTORY — DX: Atherosclerosis of renal artery: I70.1

## 2016-02-28 HISTORY — DX: Localized edema: R60.0

## 2016-02-28 HISTORY — DX: Type 2 diabetes mellitus without complications: Z79.4

## 2016-02-28 HISTORY — DX: Dependence on other enabling machines and devices: Z99.89

## 2016-02-28 HISTORY — DX: Mixed hyperlipidemia: E78.2

## 2016-02-28 HISTORY — DX: Obstructive sleep apnea (adult) (pediatric): G47.33

## 2016-02-28 HISTORY — DX: Unspecified glaucoma: H40.9

## 2016-06-23 DIAGNOSIS — L851 Acquired keratosis [keratoderma] palmaris et plantaris: Secondary | ICD-10-CM

## 2016-06-23 DIAGNOSIS — L821 Other seborrheic keratosis: Secondary | ICD-10-CM

## 2016-06-23 HISTORY — DX: Other seborrheic keratosis: L82.1

## 2016-06-23 HISTORY — DX: Acquired keratosis (keratoderma) palmaris et plantaris: L85.1

## 2016-09-04 DIAGNOSIS — K449 Diaphragmatic hernia without obstruction or gangrene: Secondary | ICD-10-CM

## 2016-09-04 HISTORY — DX: Diaphragmatic hernia without obstruction or gangrene: K44.9

## 2017-01-13 DIAGNOSIS — M7020 Olecranon bursitis, unspecified elbow: Secondary | ICD-10-CM

## 2017-01-13 HISTORY — DX: Olecranon bursitis, unspecified elbow: M70.20

## 2017-01-28 ENCOUNTER — Encounter: Payer: Self-pay | Admitting: Cardiology

## 2017-02-16 ENCOUNTER — Encounter: Payer: Self-pay | Admitting: Cardiology

## 2017-02-16 ENCOUNTER — Ambulatory Visit (INDEPENDENT_AMBULATORY_CARE_PROVIDER_SITE_OTHER): Payer: Medicare Other | Admitting: Cardiology

## 2017-02-16 VITALS — BP 100/50 | HR 53 | Ht 70.0 in | Wt 259.8 lb

## 2017-02-16 DIAGNOSIS — I951 Orthostatic hypotension: Secondary | ICD-10-CM | POA: Diagnosis not present

## 2017-02-16 DIAGNOSIS — I441 Atrioventricular block, second degree: Secondary | ICD-10-CM

## 2017-02-16 DIAGNOSIS — I10 Essential (primary) hypertension: Secondary | ICD-10-CM | POA: Diagnosis not present

## 2017-02-16 NOTE — Patient Instructions (Addendum)
Medication Instructions:  Your physician has recommended you make the following change in your medication:  NO blood pressure medications or diuretics today. CHANGE torsemide (Demadex) to once a day START on Thursday CHANGE hydralazine (Apresoline) to 0.5 tabs (25 mg) three times a day CHANGE carvedilol (Coreg) to 0.5 tablet (12.5 mg) twice daily   Labwork: Your physician recommends that you return for lab work in: today. CBC, BNP, BMP   Testing/Procedures: You had an EKG today.  Your physician has recommended that you wear an event monitor. Event monitors are medical devices that record the heart's electrical activity. Doctors most often [place] these monitors to diagnose arrhythmias. Arrhythmias are problems with the speed or rhythm of the heartbeat. The monitor is a small, portable device. You can wear one while you do your normal daily activities. This is usually used to diagnose what is causing palpitations/syncope (passing out).    Follow-Up: Your physician recommends that you schedule a follow-up appointment in: 3 weeks.   BP check on Thursday with Dr. Garlon Hatchet  Any Other Special Instructions Will Be Listed Below (If Applicable).     If you need a refill on your cardiac medications before your next appointment, please call your pharmacy.

## 2017-02-16 NOTE — Progress Notes (Signed)
Cardiology Office Note:    Date:  02/16/2017   ID:  Louis Neal, DOB 06-23-1951, MRN [id number]  PCP:  Algis Greenhouse, MD  Cardiologist:  Shirlee More, MD    Referring MD: Algis Greenhouse, MD    ASSESSMENT:    1. Essential hypertension   2. Orthostatic hypotension   3. Mobitz type 1 second degree atrioventricular block    PLAN:    In order of problems listed above:  1. His hypertension is poorly controlled he has marked hypotension in the office today although he is asymptomatic. For completeness I'll check renal function and his CBC before leaving the office he will hold all diuretics antihypertensives until tomorrow and then we'll reduce his diuretic by 50% carvedilol from 37 after 12 mg twice daily and reduce the dose of his hydralazine by 50%. I asked her to start checking his home blood pressure 1-2 days per week and have a blood pressure checked on Thursday in [place] with Dr. Garlon Hatchet.  2. See above he has baseline hypotension today 3. New finding, asymptomatic. Reduce the dose of these beta blocker and follow-up Holter monitor and office follow-up in 3 weeks.   Next appointment: 3 weeks   Medication Adjustments/Labs and Tests Ordered: Current medicines are reviewed at length with the patient today.  Concerns regarding medicines are outlined above.  Orders Placed This Encounter  Procedures  . Basic metabolic panel  . CBC  . Pro b natriuretic peptide (BNP)  . Cardiac event monitor  . EKG 12-Lead   No orders of the defined types were placed in this encounter.   Chief Complaint  Patient presents with  . Follow-up    1 year flup for HTN    History of Present Illness:    Louis Neal is a 66 y.o. male with a hx of HTN on minoxidil, and T2 DM  last seen 1 year ago. Compliance with diet, lifestyle and medications: Yes. He has not checked his home blood pressure for several months Overall he says he feels well he is unaware that he is at baseline blood pressure  in the 91M systolic and is having no chest pain shortness of breath and weakness and lightheadedness or syncope. He is not medically ill he has no fever chills nausea vomiting or clinical bleeding. Past Medical History:  Diagnosis Date  . Arthritis   . Arthropathy of lumbar facet joint ([place]) 07/22/2012  . 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 Bilateral lower extremity edema 02/28/2016  . Degeneration of intervertebral disc of lumbar region 07/22/2012  . Diabetes mellitus without complication ([place])   . Diaphragmatic hernia 09/04/2016  . GERD (gastroesophageal reflux disease)   . Glaucoma 02/28/2016  . High risk medication use 01/14/2016  . Hyperlipidemia, mixed 02/28/2016  . Hypertension    dr Bettina Gavia  in [place]  . Low back pain 07/22/2012  . Lumbosacral radiculopathy 07/22/2012  . Obstructive sleep apnea 02/28/2016  . Olecranon bursitis 01/13/2017   Overview:  2018: left  . Orthostatic hypotension 01/14/2016  . Pre-ulcerative calluses 10/17/2015  . Renal artery stenosis ([place]) 02/28/2016  . Stucco keratoses 06/23/2016    Past Surgical History:  Procedure Laterality Date  . LUMBAR LAMINECTOMY/DECOMPRESSION MICRODISCECTOMY Bilateral 09/22/2012   Procedure: LUMBAR LAMINECTOMY/DECOMPRESSION MICRODISCECTOMY 1 LEVEL;  Surgeon: Ophelia Charter, MD;  Location: [facility];  Service: Neurosurgery;  Laterality: Bilateral;  Lumbar two-three laminectomy  . NO PAST SURGERIES      Current Medications: Current Meds  Medication Sig  .  amLODipine (NORVASC) 10 MG tablet Take 10 mg by mouth daily.  Marland Kitchen aspirin EC 81 MG tablet Take 81 mg by mouth daily.  . carvedilol (COREG) 25 MG tablet Take 1.5 tablets by mouth 2 (two) times daily.  . Colesevelam HCl 3.75 G PACK Take 1 packet by mouth daily.  . cyclobenzaprine (FLEXERIL) 10 MG tablet Take 1 tablet by mouth 3 (three) times daily as needed for muscle spasms.  . Dulaglutide (TRULICITY) 1.5 MV/6.7MC SOPN INJECT ONCE A WEEK FOR DIABETES  . gabapentin (NEURONTIN)  300 MG capsule Take 300 mg by mouth 3 (three) times daily.  Marland Kitchen glucose blood (FORACARE PREMIUM V10 TEST) test strip CHECK BLOOD SUGAR FOUR TIMES DAILY  . HUMALOG KWIKPEN 100 UNIT/ML KiwkPen Inject 15 Units into the skin 3 (three) times daily before meals.  . hydrALAZINE (APRESOLINE) 50 MG tablet Take 50 mg by mouth 3 (three) times daily.  . Insulin Glargine 300 UNIT/ML SOPN Inject 80 Units into the skin daily.  Marland Kitchen latanoprost (XALATAN) 0.005 % ophthalmic solution USE ONE DROP IN AFFECTED EYE(S) EVERY DAY IN THE EVENING  . metFORMIN (GLUCOPHAGE) 1000 MG tablet Take 1,000 mg by mouth 2 (two) times daily with a meal.  . minoxidil (LONITEN) 2.5 MG tablet Take 2.5 mg by mouth every 12 (twelve) hours.  Marland Kitchen omega-3 acid ethyl esters (LOVAZA) 1 g capsule Take 2 capsules by mouth 2 (two) times daily.  Marland Kitchen omeprazole (PRILOSEC) 40 MG capsule Take 40 mg by mouth daily.  Marland Kitchen torsemide (DEMADEX) 20 MG tablet Take 1 tablet by mouth 2 (two) times daily as needed.  . urea (CARMOL) 40 % CREA Apply 1 application topically daily as needed. To feet for dry skin  . valsartan (DIOVAN) 160 MG tablet Take 160 mg by mouth every 12 (twelve) hours.     Allergies:   Atorvastatin and Ezetimibe   Social History   Social History  . Marital status: Married    Spouse name: N/A  . Number of children: N/A  . Years of education: N/A   Social History Main Topics  . Smoking status: Former Research scientist (life sciences)  . Smokeless tobacco: Never Used     Comment: quit 10 years ago  . Alcohol use No  . Drug use: No  . Sexual activity: Not Asked   Other Topics Concern  . None   Social History Narrative  . None     Family History: The patient's family history includes Cancer in his mother; Liver cancer in his father. ROS:   Please see the history of present illness.    All other systems reviewed and are negative.  EKGs/Labs/Other Studies Reviewed:    The following studies were reviewed today:  EKG:  EKG ordered today.  The ekg ordered  today demonstrates Wade Mobitz 1 second degree AVB  Recent Labs: February CMP Normal LDL 75    Physical Exam:    VS:  BP (!) 100/50 (Patient Position: Supine)   Pulse (!) 53   Ht 5\' 10"  (1.778 m)   Wt 259 lb 12.8 oz (117.8 kg)   SpO2 94%   BMI 37.28 kg/m     Wt Readings from Last 3 Encounters:  02/16/17 259 lb 12.8 oz (117.8 kg)  09/19/12 252 lb 9.6 oz (114.6 kg)    BP 90/50 both arms supine, last BP 100/50 GEN:  Well nourished, well developed in no acute distress HEENT: Normal NECK: No JVD; No carotid bruits LYMPHATICS: No lymphadenopathy CARDIAC: RRR, no murmurs, rubs, gallops RESPIRATORY:  Clear to auscultation without rales, wheezing or rhonchi  ABDOMEN: Soft, non-tender, non-distended MUSCULOSKELETAL:  No edema; No deformity  SKIN: Warm and dry NEUROLOGIC:  Alert and oriented x 3 PSYCHIATRIC:  Normal affect    Signed, Shirlee More, MD  02/16/2017 4:33 PM    [facility]

## 2017-02-17 LAB — BASIC METABOLIC PANEL
BUN / CREAT RATIO: 15 (ref 10–24)
BUN: 29 mg/dL — AB (ref 8–27)
CO2: 20 mmol/L (ref 20–29)
CREATININE: 1.97 mg/dL — AB (ref 0.76–1.27)
Calcium: 9.2 mg/dL (ref 8.6–10.2)
Chloride: 96 mmol/L (ref 96–106)
GFR calc non Af Amer: 34 mL/min/{1.73_m2} — ABNORMAL LOW (ref >59)
GFR, EST AFRICAN AMERICAN: 39 mL/min/{1.73_m2} — AB (ref >59)
Glucose: 122 mg/dL — ABNORMAL HIGH (ref 65–99)
Potassium: 4.5 mmol/L (ref 3.5–5.2)
Sodium: 136 mmol/L (ref 134–144)

## 2017-02-17 LAB — CBC
HEMATOCRIT: 38.4 % (ref 37.5–51.0)
Hemoglobin: 12.9 g/dL — ABNORMAL LOW (ref 13.0–17.7)
MCH: 30.5 pg (ref 26.6–33.0)
MCHC: 33.6 g/dL (ref 31.5–35.7)
MCV: 91 fL (ref 79–97)
Platelets: 234 10*3/uL (ref 150–379)
RBC: 4.23 x10E6/uL (ref 4.14–5.80)
RDW: 14 % (ref 12.3–15.4)
WBC: 10.7 10*3/uL (ref 3.4–10.8)

## 2017-02-23 ENCOUNTER — Telehealth: Payer: Self-pay

## 2017-02-23 DIAGNOSIS — Z79899 Other long term (current) drug therapy: Secondary | ICD-10-CM

## 2017-02-23 NOTE — Telephone Encounter (Signed)
Orders placed for Chem 7 and Pro-BNP per results note and uncollected BNP specimen.

## 2017-02-24 LAB — BASIC METABOLIC PANEL
BUN/Creatinine Ratio: 18 (ref 10–24)
BUN: 23 mg/dL (ref 8–27)
CALCIUM: 9.2 mg/dL (ref 8.6–10.2)
CHLORIDE: 95 mmol/L — AB (ref 96–106)
CO2: 23 mmol/L (ref 20–29)
Creatinine, Ser: 1.26 mg/dL (ref 0.76–1.27)
GFR, EST AFRICAN AMERICAN: 67 mL/min/{1.73_m2} (ref >59)
GFR, EST NON AFRICAN AMERICAN: 58 mL/min/{1.73_m2} — AB (ref >59)
Glucose: 204 mg/dL — ABNORMAL HIGH (ref 65–99)
POTASSIUM: 4.9 mmol/L (ref 3.5–5.2)
Sodium: 138 mmol/L (ref 134–144)

## 2017-02-24 LAB — PRO B NATRIURETIC PEPTIDE: NT-Pro BNP: 123 pg/mL (ref 0–376)

## 2017-02-25 ENCOUNTER — Other Ambulatory Visit: Payer: Self-pay | Admitting: Cardiology

## 2017-02-25 ENCOUNTER — Ambulatory Visit: Payer: Medicare Other

## 2017-02-25 DIAGNOSIS — I951 Orthostatic hypotension: Secondary | ICD-10-CM

## 2017-02-25 DIAGNOSIS — R001 Bradycardia, unspecified: Secondary | ICD-10-CM

## 2017-02-25 DIAGNOSIS — I10 Essential (primary) hypertension: Secondary | ICD-10-CM

## 2017-02-25 DIAGNOSIS — I441 Atrioventricular block, second degree: Secondary | ICD-10-CM

## 2017-02-28 ENCOUNTER — Telehealth: Payer: Self-pay | Admitting: Internal Medicine

## 2017-02-28 NOTE — Telephone Encounter (Signed)
Received notification from Holter that patient went into atrial fibrillation overnight; will forward to ordering provider

## 2017-03-01 ENCOUNTER — Other Ambulatory Visit: Payer: Self-pay

## 2017-03-01 ENCOUNTER — Telehealth: Payer: Self-pay

## 2017-03-01 MED ORDER — OLMESARTAN MEDOXOMIL 20 MG PO TABS: 20.0000 mg | ORAL_TABLET | Freq: Every day | ORAL | 11 refills | Status: DC

## 2017-03-01 NOTE — Telephone Encounter (Signed)
Mamie Laurel!  I left you a voicemail, but wasn't sure if you would get it. How does Dr. Agustin Cree pull up the holter report?  Thanks, Herschel Senegal, RN

## 2017-03-01 NOTE — Telephone Encounter (Signed)
Spoke with patient. Informed that event monitor showed a period of irregular heart beat last night. Advised to come in for an appointment to discuss anticoagulation options. Appointment made for tomorrow 03/02/17 at 1:40 pm with Dr. Agustin Cree. Patient verbalized understanding.

## 2017-03-01 NOTE — Telephone Encounter (Signed)
Please ask RJK to review holder and decide re anticoagulation

## 2017-03-01 NOTE — Telephone Encounter (Signed)
Received phone call from [facility] about patient being on valsartan and medication being recalled. Returned call to [facility] and advised of sending prescription for olmesartan 20 mg daily.

## 2017-03-02 ENCOUNTER — Ambulatory Visit (INDEPENDENT_AMBULATORY_CARE_PROVIDER_SITE_OTHER): Payer: Medicare Other | Admitting: Cardiology

## 2017-03-02 ENCOUNTER — Encounter: Payer: Self-pay | Admitting: Cardiology

## 2017-03-02 VITALS — BP 188/80 | HR 116 | Resp 12 | Ht 70.0 in | Wt 264.1 lb

## 2017-03-02 DIAGNOSIS — I480 Paroxysmal atrial fibrillation: Secondary | ICD-10-CM

## 2017-03-02 DIAGNOSIS — G4733 Obstructive sleep apnea (adult) (pediatric): Secondary | ICD-10-CM

## 2017-03-02 DIAGNOSIS — I10 Essential (primary) hypertension: Secondary | ICD-10-CM | POA: Diagnosis not present

## 2017-03-02 HISTORY — DX: Paroxysmal atrial fibrillation: I48.0

## 2017-03-02 MED ORDER — HYDRALAZINE HCL 25 MG PO TABS: ORAL_TABLET | ORAL | 3 refills | Status: DC

## 2017-03-02 NOTE — Progress Notes (Signed)
Cardiology Office Note:    Date:  03/02/2017   ID:  Louis Neal, DOB 1951/07/08, MRN [id number]  PCP:  Algis Greenhouse, MD  Cardiologist:  Jenne Campus, MD    Referring MD: Algis Greenhouse, MD   Chief Complaint  Patient presents with  . Follow up Holter  To review event recorder  History of Present Illness:    Louis Neal is a 66 y.o. male  with past medical history significant for hypertension for many years. Recently he seen my partner because of second-degree AV block as well as hypotension. His medications were very appropriately adjusted and he is wearing event recorder. Few days ago I was asked to review some of his rhythm strip which clearly show irregularly irregular rhythm showing indicating presence of atrial fibrillation. This is new discovery. He was completely unaware of this rhythm. We spent cradle of time talking about atrial fibrillation I told one of the risk of this rhythm including risk of stroke. We talked about preventing stroke by anti-coagulation. He is willing to proceed with anticoagulation. He's CHADS2Vas score is at least 3. I will ask him to have Chem-7 as well as CBC and PT/INR done before initiation of anticoagulation. I will also ask him to have an echocardiogram to check left ventricular ejection fraction and more importantly left atrial size.  Past Medical History:  Diagnosis Date  . Arthritis   . Arthropathy of lumbar facet joint ([place]) 07/22/2012  . 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 Bilateral lower extremity edema 02/28/2016  . Degeneration of intervertebral disc of lumbar region 07/22/2012  . Diabetes mellitus without complication ([place])   . Diaphragmatic hernia 09/04/2016  . GERD (gastroesophageal reflux disease)   . Glaucoma 02/28/2016  . High risk medication use 01/14/2016  . Hyperlipidemia, mixed 02/28/2016  . Hypertension    dr Bettina Gavia  in [place]  . Low back pain 07/22/2012  . Lumbosacral radiculopathy 07/22/2012  . Obstructive sleep apnea  02/28/2016  . Olecranon bursitis 01/13/2017   Overview:  2018: left  . Orthostatic hypotension 01/14/2016  . Pre-ulcerative calluses 10/17/2015  . Renal artery stenosis ([place]) 02/28/2016  . Stucco keratoses 06/23/2016    Past Surgical History:  Procedure Laterality Date  . LUMBAR LAMINECTOMY/DECOMPRESSION MICRODISCECTOMY Bilateral 09/22/2012   Procedure: LUMBAR LAMINECTOMY/DECOMPRESSION MICRODISCECTOMY 1 LEVEL;  Surgeon: Ophelia Charter, MD;  Location: [place] [facility];  Service: Neurosurgery;  Laterality: Bilateral;  Lumbar two-three laminectomy  . NO PAST SURGERIES      Current Medications: Current Meds  Medication Sig  . amLODipine (NORVASC) 10 MG tablet Take 10 mg by mouth daily.  Marland Kitchen aspirin EC 81 MG tablet Take 81 mg by mouth daily.  . carvedilol (COREG) 25 MG tablet Take 0.5 tablets by mouth 2 (two) times daily.  . Colesevelam HCl 3.75 G PACK Take 1 packet by mouth daily.  . cyclobenzaprine (FLEXERIL) 10 MG tablet Take 1 tablet by mouth 3 (three) times daily as needed for muscle spasms.  . Dulaglutide (TRULICITY) 1.5 MP/5.3IR SOPN INJECT ONCE A WEEK FOR DIABETES  . gabapentin (NEURONTIN) 300 MG capsule Take 300 mg by mouth 3 (three) times daily.  Marland Kitchen glucose blood (FORACARE PREMIUM V10 TEST) test strip CHECK BLOOD SUGAR FOUR TIMES DAILY  . HUMALOG KWIKPEN 100 UNIT/ML KiwkPen Inject 15 Units into the skin 3 (three) times daily before meals.  . hydrALAZINE (APRESOLINE) 50 MG tablet Take 25 mg by mouth 3 (three) times daily.  . Insulin Glargine 300 UNIT/ML SOPN Inject 80 Units  into the skin daily.  Marland Kitchen latanoprost (XALATAN) 0.005 % ophthalmic solution USE ONE DROP IN AFFECTED EYE(S) EVERY DAY IN THE EVENING  . metFORMIN (GLUCOPHAGE) 1000 MG tablet Take 1,000 mg by mouth 2 (two) times daily with a meal.  . minoxidil (LONITEN) 2.5 MG tablet Take 2.5 mg by mouth every 12 (twelve) hours.  Marland Kitchen olmesartan (BENICAR) 20 MG tablet Take 1 tablet (20 mg total) by mouth daily.  Marland Kitchen omega-3 acid ethyl esters  (LOVAZA) 1 g capsule Take 2 capsules by mouth 2 (two) times daily.  Marland Kitchen omeprazole (PRILOSEC) 40 MG capsule Take 40 mg by mouth daily.  Marland Kitchen torsemide (DEMADEX) 20 MG tablet Take 1 tablet by mouth daily.  . urea (CARMOL) 40 % CREA Apply 1 application topically daily as needed. To feet for dry skin     Allergies:   Atorvastatin and Ezetimibe   Social History   Social History  . Marital status: Married    Spouse name: N/A  . Number of children: N/A  . Years of education: N/A   Social History Main Topics  . Smoking status: Former Research scientist (life sciences)  . Smokeless tobacco: Never Used     Comment: quit 10 years ago  . Alcohol use No  . Drug use: No  . Sexual activity: Not Asked   Other Topics Concern  . None   Social History Narrative  . None     Family History: The patient's family history includes Cancer in his mother; Liver cancer in his father. ROS:   Please see the history of present illness.    All 14 point review of systems negative except as described per history of present illness  EKGs/Labs/Other Studies Reviewed:      Recent Labs: 02/16/2017: Hemoglobin 12.9; Platelets 234 02/23/2017: BUN 23; Creatinine, Ser 1.26; NT-Pro BNP 123; Potassium 4.9; Sodium 138  Recent Lipid Panel No results found for: CHOL, TRIG, HDL, CHOLHDL, VLDL, LDLCALC, LDLDIRECT  Physical Exam:    VS:  BP (!) 188/80   Pulse (!) 116   Resp 12   Ht 5\' 10"  (1.778 m)   Wt 264 lb 1.9 oz (119.8 kg)   BMI 37.90 kg/m     Wt Readings from Last 3 Encounters:  03/02/17 264 lb 1.9 oz (119.8 kg)  02/16/17 259 lb 12.8 oz (117.8 kg)  09/19/12 252 lb 9.6 oz (114.6 kg)     GEN:  Well nourished, well developed in no acute distress HEENT: Normal NECK: No JVD; No carotid bruits LYMPHATICS: No lymphadenopathy CARDIAC: RRR, no murmurs, no rubs, no gallops RESPIRATORY:  Clear to auscultation without rales, wheezing or rhonchi  ABDOMEN: Soft, non-tender, non-distended MUSCULOSKELETAL:  No edema; No deformity  SKIN:  Warm and dry LOWER EXTREMITIES: no swelling NEUROLOGIC:  Alert and oriented x 3 PSYCHIATRIC:  Normal affect   ASSESSMENT:    1. Obstructive sleep apnea   2. Paroxysmal atrial fibrillation (HCC)   3. Essential hypertension    PLAN:    In order of problems listed above:  1. Paroxysmal atrial fibrillation: CHADS2Vas score equals 2, recent discovery eventrecorder. Plan is outlined above. In terms of etiology of this phenomenon undoubtedly long-term hypertension placed some significant role here. I suspect sleep apnea is also very important here. I spoke to him again about having sleep study and being treated for it he said he would like to think it over. He does have some intermittent swelling of lower extremities which could be related to sleep apnea and pulmonary hypertension, I will  ask him to have echocardiogram to check pulmonary artery pressure. 2. Essential hypertension. Recently dosages of medication were reduced because of hypotension. Now he tells me his blood pressure is normal at home however when asked what normally seatbelt in 771-165 systolic. I will increase 18 dose of hydralazine hoping that his blood pressure will be better controlled. Obstructive sleep apnea I offered him sleep study he refuses for now. This issue needs to be revisited in the future.  Medication Adjustments/Labs and Tests Ordered: Current medicines are reviewed at length with the patient today.  Concerns regarding medicines are outlined above.  No orders of the defined types were placed in this encounter.  Medication changes: No orders of the defined types were placed in this encounter.   Signed, Park Liter, MD, [facility] 03/02/2017 2:28 PM    [place]

## 2017-03-02 NOTE — Patient Instructions (Addendum)
Medication Instructions:  Increase the hydralazine to 1.5 tablets 3 times daily. Please continue to monitor you BP.   Labwork: Your physician recommends that you have labs today: CBC, Pt/INR so that we can start you on anticoagulation.   Testing/Procedures: Your physician has requested that you have an echocardiogram. Echocardiography is a painless test that uses sound waves to create images of your heart. It provides your doctor with information about the size and shape of your heart and how well your heart's chambers and valves are working. This procedure takes approximately one hour. There are no restrictions for this procedure.   Follow-Up: Your physician recommends that you schedule a follow-up appointment in: 1 month with Dr. Bettina Gavia   Any Other Special Instructions Will Be Listed Below (If Applicable).     If you need a refill on your cardiac medications before your next appointment, please call your pharmacy.

## 2017-03-03 ENCOUNTER — Telehealth: Payer: Self-pay | Admitting: Cardiology

## 2017-03-03 LAB — CBC
Hematocrit: 37.8 % (ref 37.5–51.0)
Hemoglobin: 13.1 g/dL (ref 13.0–17.7)
MCH: 30.3 pg (ref 26.6–33.0)
MCHC: 34.7 g/dL (ref 31.5–35.7)
MCV: 88 fL (ref 79–97)
PLATELETS: 245 10*3/uL (ref 150–379)
RBC: 4.32 x10E6/uL (ref 4.14–5.80)
RDW: 13.5 % (ref 12.3–15.4)
WBC: 8.6 10*3/uL (ref 3.4–10.8)

## 2017-03-03 LAB — PROTIME-INR
INR: 1 (ref 0.8–1.2)
PROTHROMBIN TIME: 10.6 s (ref 9.1–12.0)

## 2017-03-03 NOTE — Telephone Encounter (Signed)
Called patient and advised that the imaging number that is in under CV Proc in chart states 30 days. Patient states that when [employer] called him, they told him 30 days. Patient states that when monitor was put on he was told it was for 2 weeks. Left message with Holter/Monitor department at the [facility] location to call me back to confirm.

## 2017-03-03 NOTE — Telephone Encounter (Signed)
New message  Pt call requesting to speak with RN about the correct date to turn monitor back in.please call back to discuss

## 2017-03-11 ENCOUNTER — Telehealth: Payer: Self-pay

## 2017-03-11 ENCOUNTER — Other Ambulatory Visit: Payer: Self-pay

## 2017-03-11 ENCOUNTER — Ambulatory Visit (HOSPITAL_BASED_OUTPATIENT_CLINIC_OR_DEPARTMENT_OTHER)
Admission: RE | Admit: 2017-03-11 | Discharge: 2017-03-11 | Disposition: A | Discharge status: Home / Self Care | Payer: Medicare Other | Source: Ambulatory Visit | Attending: Cardiology | Admitting: Cardiology

## 2017-03-11 DIAGNOSIS — I10 Essential (primary) hypertension: Secondary | ICD-10-CM | POA: Insufficient documentation

## 2017-03-11 DIAGNOSIS — I480 Paroxysmal atrial fibrillation: Secondary | ICD-10-CM | POA: Diagnosis present

## 2017-03-11 DIAGNOSIS — G4733 Obstructive sleep apnea (adult) (pediatric): Secondary | ICD-10-CM | POA: Diagnosis not present

## 2017-03-11 DIAGNOSIS — I340 Nonrheumatic mitral (valve) insufficiency: Secondary | ICD-10-CM | POA: Diagnosis not present

## 2017-03-11 LAB — ECHOCARDIOGRAM COMPLETE
CHL CUP DOP CALC LVOT VTI: 26.5 cm
E decel time: 222 msec
EERAT: 10.55
FS: 27 % — AB (ref 28–44)
IVS/LV PW RATIO, ED: 1.05
LA ID, A-P, ES: 45 mm
LA diam end sys: 45 mm
LA diam index: 1.91 cm/m2
LA vol A4C: 73.6 ml
LA vol index: 29.5 mL/m2
LAVOL: 69.3 mL
LDCA: 5.31 cm2
LV PW d: 11.8 mm — AB (ref 0.6–1.1)
LV TDI E'LATERAL: 9.57
LV TDI E'MEDIAL: 7.51
LV e' LATERAL: 9.57 cm/s
LVEEAVG: 10.55
LVEEMED: 10.55
LVOT diameter: 26 mm
LVOT peak grad rest: 7 mmHg
LVOTPV: 135 cm/s
LVOTSV: 141 mL
Lateral S' vel: 15.2 cm/s
MV Dec: 222
MV pk A vel: 108 m/s
MV pk E vel: 101 m/s
MVPG: 4 mmHg
RV TAPSE: 26.4 mm

## 2017-03-11 MED ORDER — CARVEDILOL 25 MG PO TABS
12.5000 mg | ORAL_TABLET | Freq: Two times a day (BID) | ORAL | 11 refills | Status: DC
Start: 2017-03-11 — End: 2017-06-08

## 2017-03-11 NOTE — Telephone Encounter (Signed)
Refill faxed from pharmacy.

## 2017-03-11 NOTE — Progress Notes (Signed)
  Echocardiogram 2D Echocardiogram has been performed.  Louis Neal 03/11/2017, 3:48 PM

## 2017-03-11 NOTE — Addendum Note (Signed)
Addended by: Warner Mccreedy E on: 03/11/2017 03:49 PM   Modules accepted: Orders

## 2017-03-12 LAB — BASIC METABOLIC PANEL
BUN / CREAT RATIO: 14 (ref 10–24)
BUN: 21 mg/dL (ref 8–27)
CHLORIDE: 99 mmol/L (ref 96–106)
CO2: 23 mmol/L (ref 20–29)
Calcium: 9.8 mg/dL (ref 8.6–10.2)
Creatinine, Ser: 1.47 mg/dL — ABNORMAL HIGH (ref 0.76–1.27)
GFR calc Af Amer: 56 mL/min/{1.73_m2} — ABNORMAL LOW (ref >59)
GFR calc non Af Amer: 48 mL/min/{1.73_m2} — ABNORMAL LOW (ref >59)
Glucose: 117 mg/dL — ABNORMAL HIGH (ref 65–99)
Potassium: 4.6 mmol/L (ref 3.5–5.2)
SODIUM: 141 mmol/L (ref 134–144)

## 2017-03-12 MED ORDER — APIXABAN 5 MG PO TABS
5.0000 mg | ORAL_TABLET | Freq: Two times a day (BID) | ORAL | 3 refills | Status: DC
Start: 2017-03-12 — End: 2018-03-08

## 2017-03-12 NOTE — Telephone Encounter (Signed)
Pt has been educated regarding the start of Eliquis 5 mg BID. Pt denies any further questions at this time and agrees to call with any concerns.

## 2017-03-12 NOTE — Telephone Encounter (Signed)
-----   Message from Park Liter, MD sent at 03/12/2017  8:42 AM EDT ----- Echo looks good, LA mildly enlarged, creat mildly elevated dc ASA, start eliquis 5 mg po BID, no Xarelto

## 2017-03-16 ENCOUNTER — Ambulatory Visit: Payer: Medicare Other | Admitting: Cardiology

## 2017-04-02 ENCOUNTER — Ambulatory Visit (INDEPENDENT_AMBULATORY_CARE_PROVIDER_SITE_OTHER): Payer: Medicare Other | Admitting: Cardiology

## 2017-04-02 ENCOUNTER — Encounter: Payer: Self-pay | Admitting: Cardiology

## 2017-04-02 VITALS — BP 130/68 | HR 68 | Resp 12 | Ht 70.0 in | Wt 264.0 lb

## 2017-04-02 DIAGNOSIS — I480 Paroxysmal atrial fibrillation: Secondary | ICD-10-CM

## 2017-04-02 DIAGNOSIS — I495 Sick sinus syndrome: Secondary | ICD-10-CM | POA: Diagnosis not present

## 2017-04-02 DIAGNOSIS — E782 Mixed hyperlipidemia: Secondary | ICD-10-CM

## 2017-04-02 DIAGNOSIS — R0683 Snoring: Secondary | ICD-10-CM | POA: Diagnosis not present

## 2017-04-02 DIAGNOSIS — I10 Essential (primary) hypertension: Secondary | ICD-10-CM | POA: Diagnosis not present

## 2017-04-02 HISTORY — DX: Sick sinus syndrome: I49.5

## 2017-04-02 NOTE — Progress Notes (Signed)
Cardiology Office Note:    Date:  04/02/2017   ID:  Louis Neal, DOB Oct 05, 1950, MRN [id number]  PCP:  Algis Greenhouse, MD  Cardiologist:  Jenne Campus, MD    Referring MD: Algis Greenhouse, MD   Chief Complaint  Patient presents with  . 1 month follow up  he is doing well  History of Present Illness:    Louis Neal is a 66 y.o. male  With paroxysmal atrial fibrillation. Anticoagulation has being initiated. He is doing well denies having any chest pain tightnezing pressure burning chest. We had a long discussion about what to do with the situations and options are putting him on antiarrhythmic medications, continue present management or find out if he does have significant sleep apne and treated. He finally agreed to have sleep study to check for sleep apnea  Past Medical History:  Diagnosis Date  . Arthritis   . Arthropathy of lumbar facet joint ([place]) 07/22/2012  . 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 Bilateral lower extremity edema 02/28/2016  . Degeneration of intervertebral disc of lumbar region 07/22/2012  . Diabetes mellitus without complication ([place])   . Diaphragmatic hernia 09/04/2016  . GERD (gastroesophageal reflux disease)   . Glaucoma 02/28/2016  . High risk medication use 01/14/2016  . Hyperlipidemia, mixed 02/28/2016  . Hypertension    dr Bettina Gavia  in [place]  . Low back pain 07/22/2012  . Lumbosacral radiculopathy 07/22/2012  . Obstructive sleep apnea 02/28/2016  . Olecranon bursitis 01/13/2017   Overview:  2018: left  . Orthostatic hypotension 01/14/2016  . Pre-ulcerative calluses 10/17/2015  . Renal artery stenosis ([place]) 02/28/2016  . Stucco keratoses 06/23/2016    Past Surgical History:  Procedure Laterality Date  . LUMBAR LAMINECTOMY/DECOMPRESSION MICRODISCECTOMY Bilateral 09/22/2012   Procedure: LUMBAR LAMINECTOMY/DECOMPRESSION MICRODISCECTOMY 1 LEVEL;  Surgeon: Ophelia Charter, MD;  Location: [facility];  Service: Neurosurgery;  Laterality: Bilateral;   Lumbar two-three laminectomy  . NO PAST SURGERIES      Current Medications: Current Meds  Medication Sig  . amLODipine (NORVASC) 10 MG tablet Take 10 mg by mouth daily.  Marland Kitchen apixaban (ELIQUIS) 5 MG TABS tablet Take 1 tablet (5 mg total) by mouth 2 (two) times daily.  . carvedilol (COREG) 25 MG tablet Take 0.5 tablets (12.5 mg total) by mouth 2 (two) times daily.  . Colesevelam HCl 3.75 G PACK Take 1 packet by mouth daily.  . cyclobenzaprine (FLEXERIL) 10 MG tablet Take 1 tablet by mouth 3 (three) times daily as needed for muscle spasms.  . Dulaglutide (TRULICITY) 1.5 YH/0.6CB SOPN INJECT ONCE A WEEK FOR DIABETES  . gabapentin (NEURONTIN) 300 MG capsule Take 300 mg by mouth 3 (three) times daily.  Marland Kitchen glucose blood (FORACARE PREMIUM V10 TEST) test strip CHECK BLOOD SUGAR FOUR TIMES DAILY  . HUMALOG KWIKPEN 100 UNIT/ML KiwkPen Inject 15 Units into the skin 3 (three) times daily before meals.  . hydrALAZINE (APRESOLINE) 25 MG tablet Take 1.5 tablets by mouth 3 times daily.  . Insulin Glargine 300 UNIT/ML SOPN Inject 80 Units into the skin daily.  Marland Kitchen latanoprost (XALATAN) 0.005 % ophthalmic solution USE ONE DROP IN AFFECTED EYE(S) EVERY DAY IN THE EVENING  . metFORMIN (GLUCOPHAGE) 1000 MG tablet Take 1,000 mg by mouth 2 (two) times daily with a meal.  . minoxidil (LONITEN) 2.5 MG tablet Take 2.5 mg by mouth every 12 (twelve) hours.  Marland Kitchen olmesartan (BENICAR) 20 MG tablet Take 1 tablet (20 mg total) by mouth daily.  Marland Kitchen  omega-3 acid ethyl esters (LOVAZA) 1 g capsule Take 2 capsules by mouth 2 (two) times daily.  Marland Kitchen omeprazole (PRILOSEC) 40 MG capsule Take 40 mg by mouth daily.  Marland Kitchen torsemide (DEMADEX) 20 MG tablet Take 1 tablet by mouth daily.  . urea (CARMOL) 40 % CREA Apply 1 application topically daily as needed. To feet for dry skin     Allergies:   Atorvastatin and Ezetimibe   Social History   Social History  . Marital status: Married    Spouse name: N/A  . Number of children: N/A  . Years of  education: N/A   Social History Main Topics  . Smoking status: Former Research scientist (life sciences)  . Smokeless tobacco: Never Used     Comment: quit 10 years ago  . Alcohol use No  . Drug use: No  . Sexual activity: Not Asked   Other Topics Concern  . None   Social History Narrative  . None     Family History: The patient's family history includes Cancer in his mother; Liver cancer in his father. ROS:   Please see the history of present illness.    All 14 point review of systems negative except as described per history of present illness  EKGs/Labs/Other Studies Reviewed:      Recent Labs: 02/23/2017: NT-Pro BNP 123 03/02/2017: Hemoglobin 13.1; Platelets 245 03/11/2017: BUN 21; Creatinine, Ser 1.47; Potassium 4.6; Sodium 141  Recent Lipid Panel No results found for: CHOL, TRIG, HDL, CHOLHDL, VLDL, LDLCALC, LDLDIRECT  Physical Exam:    VS:  BP 130/68   Pulse 68   Resp 12   Ht 5\' 10"  (1.778 m)   Wt 264 lb (119.7 kg)   BMI 37.88 kg/m     Wt Readings from Last 3 Encounters:  04/02/17 264 lb (119.7 kg)  03/02/17 264 lb 1.9 oz (119.8 kg)  02/16/17 259 lb 12.8 oz (117.8 kg)     GEN:  Well nourished, well developed in no acute distress HEENT: Normal NECK: No JVD; No carotid bruits LYMPHATICS: No lymphadenopathy CARDIAC: RRR, no murmurs, no rubs, no gallops RESPIRATORY:  Clear to auscultation without rales, wheezing or rhonchi  ABDOMEN: Soft, non-tender, non-distended MUSCULOSKELETAL:  No edema; No deformity  SKIN: Warm and dry LOWER EXTREMITIES: no swelling NEUROLOGIC:  Alert and oriented x 3 PSYCHIATRIC:  Normal affect   ASSESSMENT:    1. Essential hypertension   2. Paroxysmal atrial fibrillation (HCC)   3. Hyperlipidemia, mixed   4. Tachycardia-bradycardia syndrome ([place])    PLAN:    In order of problems listed above:  1. Essential hypertension: Blood pressure seems to be well-controlled will continue present management. 2. Paroxysmal atrial fibrillation:Agreed to have a  sleep study. 3. Dyslipidemia follow-upsician. 4. Tachycardia bradycardia syndrome:Likely does not be criteria for pacemaker yet but will watch this carefully.   Medication Adjustments/Labs and Tests Ordered: Current medicines are reviewed at length with the patient today.  Concerns regarding medicines are outlined above.  No orders of the defined types were placed in this encounter.  Medication changes: No orders of the defined types were placed in this encounter.   Signed, Park Liter, MD, [facility] 04/02/2017 11:35 AM    [place]

## 2017-04-02 NOTE — Patient Instructions (Signed)
Medication Instructions:  Your physician recommends that you continue on your current medications as directed. Please refer to the Current Medication list given to you today.  Labwork: None   Testing/Procedures: Your physician has recommended that you have a sleep study. This test records several body functions during sleep, including: brain activity, eye movement, oxygen and carbon dioxide blood levels, heart rate and rhythm, breathing rate and rhythm, the flow of air through your mouth and nose, snoring, body muscle movements, and chest and belly movement.  [facility]  [place] [address], [place], [place] [address] [phone]  Follow-Up: Your physician recommends that you schedule a follow-up appointment in: 1-2  Months (will need to have sleep study prior to follow up)  Any Other Special Instructions Will Be Listed Below (If Applicable).  Please note that any paperwork needing to be filled out by the provider will need to be addressed at the front desk prior to seeing the provider. Please note that any paperwork FMLA, Disability or other documents regarding health condition is subject to a $25.00 charge that must be received prior to completion of paperwork in the form of a money order or check.     If you need a refill on your cardiac medications before your next appointment, please call your pharmacy.

## 2017-04-13 ENCOUNTER — Telehealth: Payer: Self-pay

## 2017-04-13 MED ORDER — CYCLOBENZAPRINE HCL 10 MG PO TABS: 10.0000 mg | ORAL_TABLET | Freq: Three times a day (TID) | ORAL | 3 refills | Status: DC | PRN

## 2017-04-13 NOTE — Telephone Encounter (Signed)
Refill request for cyclobenzaprine from pharmacy. Refills provided

## 2017-04-14 ENCOUNTER — Telehealth: Payer: Self-pay | Admitting: Cardiology

## 2017-04-14 NOTE — Telephone Encounter (Signed)
Referral has been resent.

## 2017-04-14 NOTE — Telephone Encounter (Signed)
Only received 2 pages of the referral [phone])-needs what test needs to be ordered and pt demographics

## 2017-04-14 NOTE — Telephone Encounter (Signed)
Louis Neal patient. 

## 2017-06-02 ENCOUNTER — Ambulatory Visit (INDEPENDENT_AMBULATORY_CARE_PROVIDER_SITE_OTHER): Payer: Medicare Other | Admitting: Cardiology

## 2017-06-02 ENCOUNTER — Encounter: Payer: Self-pay | Admitting: Cardiology

## 2017-06-02 VITALS — BP 140/68 | HR 81 | Resp 17 | Ht 71.0 in | Wt 264.0 lb

## 2017-06-02 DIAGNOSIS — G4733 Obstructive sleep apnea (adult) (pediatric): Secondary | ICD-10-CM | POA: Diagnosis not present

## 2017-06-02 DIAGNOSIS — I495 Sick sinus syndrome: Secondary | ICD-10-CM

## 2017-06-02 DIAGNOSIS — E118 Type 2 diabetes mellitus with unspecified complications: Secondary | ICD-10-CM

## 2017-06-02 DIAGNOSIS — I480 Paroxysmal atrial fibrillation: Secondary | ICD-10-CM | POA: Diagnosis not present

## 2017-06-02 DIAGNOSIS — I10 Essential (primary) hypertension: Secondary | ICD-10-CM

## 2017-06-02 DIAGNOSIS — Z794 Long term (current) use of insulin: Secondary | ICD-10-CM

## 2017-06-02 NOTE — Patient Instructions (Addendum)
Medication Instructions:  Your physician recommends that you continue on your current medications as directed. Please refer to the Current Medication list given to you today.  1. Avoid all over-the-counter antihistamines except Claritin/Loratadine and Zyrtec/Cetrizine. 2. Avoid all combination including cold sinus allergies flu decongestant and sleep medications 3. You can use Robitussin DM Mucinex and Mucinex DM for cough. 4. can use Tylenol aspirin ibuprofen and naproxen but no combinations such as sleep or sinus.  Labwork: None    Testing/Procedures: None   Follow-Up: Your physician recommends that you schedule a follow-up appointment in: 3 months  Any Other Special Instructions Will Be Listed Below (If Applicable).  Please note that any paperwork needing to be filled out by the provider will need to be addressed at the front desk prior to seeing the provider. Please note that any paperwork FMLA, Disability or other documents regarding health condition is subject to a $25.00 charge that must be received prior to completion of paperwork in the form of a money order or check.    If you need a refill on your cardiac medications before your next appointment, please call your pharmacy.

## 2017-06-02 NOTE — Progress Notes (Signed)
Cardiology Office Note:    Date:  06/02/2017   ID:  Louis Neal, DOB 07-11-1951, MRN [id number]  PCP:  Algis Greenhouse, MD  Cardiologist:  Jenne Campus, MD    Referring MD: Algis Greenhouse, MD   No chief complaint on file. Doing well  History of Present Illness:    Louis Neal is a 66 y.o. male with paroxysmal atrial fibrillation.  Recently had sleep study which showed significant sleep apnea that is being managed by neurology.  Denies have any chest pain tightness squeezing pressure burning chest no palpitations.  Overall doing better.  Past Medical History:  Diagnosis Date  . Arthritis   . Arthropathy of lumbar facet joint 07/22/2012  . 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 Bilateral lower extremity edema 02/28/2016  . Degeneration of intervertebral disc of lumbar region 07/22/2012  . Diabetes mellitus without complication ([place])   . Diaphragmatic hernia 09/04/2016  . GERD (gastroesophageal reflux disease)   . Glaucoma 02/28/2016  . High risk medication use 01/14/2016  . Hyperlipidemia, mixed 02/28/2016  . Hypertension    dr Bettina Gavia  in [place]  . Low back pain 07/22/2012  . Lumbosacral radiculopathy 07/22/2012  . Obstructive sleep apnea 02/28/2016  . Olecranon bursitis 01/13/2017   Overview:  2018: left  . Orthostatic hypotension 01/14/2016  . Pre-ulcerative calluses 10/17/2015  . Renal artery stenosis ([place]) 02/28/2016  . Stucco keratoses 06/23/2016    Past Surgical History:  Procedure Laterality Date  . LUMBAR LAMINECTOMY/DECOMPRESSION MICRODISCECTOMY Bilateral 09/22/2012   Procedure: LUMBAR LAMINECTOMY/DECOMPRESSION MICRODISCECTOMY 1 LEVEL;  Surgeon: Ophelia Charter, MD;  Location: [facility];  Service: Neurosurgery;  Laterality: Bilateral;  Lumbar two-three laminectomy  . NO PAST SURGERIES      Current Medications: Current Meds  Medication Sig  . amLODipine (NORVASC) 10 MG tablet Take 10 mg by mouth daily.  Marland Kitchen apixaban (ELIQUIS) 5 MG TABS tablet Take 1 tablet (5  mg total) by mouth 2 (two) times daily.  . carvedilol (COREG) 25 MG tablet Take 0.5 tablets (12.5 mg total) by mouth 2 (two) times daily.  . Colesevelam HCl 3.75 G PACK Take 1 packet by mouth daily.  . cyclobenzaprine (FLEXERIL) 10 MG tablet Take 1 tablet (10 mg total) by mouth 3 (three) times daily as needed for muscle spasms.  . Dulaglutide (TRULICITY) 1.5 XO/3.2NV SOPN INJECT ONCE A WEEK FOR DIABETES  . esomeprazole (NEXIUM) 40 MG capsule Take 40 mg by mouth daily.  Marland Kitchen gabapentin (NEURONTIN) 600 MG tablet Take 600 mg by mouth 2 (two) times daily.  Marland Kitchen glucose blood (FORACARE PREMIUM V10 TEST) test strip CHECK BLOOD SUGAR FOUR TIMES DAILY  . HUMALOG KWIKPEN 100 UNIT/ML KiwkPen Inject 15 Units into the skin 3 (three) times daily before meals.  . hydrALAZINE (APRESOLINE) 25 MG tablet Take 1.5 tablets by mouth 3 times daily.  . Insulin Glargine 300 UNIT/ML SOPN Inject 80 Units into the skin daily.  Marland Kitchen latanoprost (XALATAN) 0.005 % ophthalmic solution USE ONE DROP IN AFFECTED EYE(S) EVERY DAY IN THE EVENING  . metFORMIN (GLUCOPHAGE) 1000 MG tablet Take 1,000 mg by mouth 2 (two) times daily with a meal.  . minoxidil (LONITEN) 2.5 MG tablet Take 2.5 mg by mouth every 12 (twelve) hours.  Marland Kitchen olmesartan (BENICAR) 20 MG tablet Take 1 tablet (20 mg total) by mouth daily.  Marland Kitchen omega-3 acid ethyl esters (LOVAZA) 1 g capsule Take 2 capsules by mouth 2 (two) times daily.  Marland Kitchen omeprazole (PRILOSEC) 40 MG capsule Take 40 mg  by mouth daily.  Marland Kitchen torsemide (DEMADEX) 20 MG tablet Take 1 tablet by mouth daily.  . urea (CARMOL) 40 % CREA Apply 1 application topically daily as needed. To feet for dry skin     Allergies:   Atorvastatin and Ezetimibe   Social History   Social History  . Marital status: Married    Spouse name: N/A  . Number of children: N/A  . Years of education: N/A   Social History Main Topics  . Smoking status: Former Research scientist (life sciences)  . Smokeless tobacco: Never Used     Comment: quit 10 years ago  .  Alcohol use No  . Drug use: No  . Sexual activity: Not Asked   Other Topics Concern  . None   Social History Narrative  . None     Family History: The patient's family history includes Cancer in his mother; Liver cancer in his father. ROS:   Please see the history of present illness.    All 14 point review of systems negative except as described per history of present illness  EKGs/Labs/Other Studies Reviewed:      Recent Labs: 02/23/2017: NT-Pro BNP 123 03/02/2017: Hemoglobin 13.1; Platelets 245 03/11/2017: BUN 21; Creatinine, Ser 1.47; Potassium 4.6; Sodium 141  Recent Lipid Panel No results found for: CHOL, TRIG, HDL, CHOLHDL, VLDL, LDLCALC, LDLDIRECT  Physical Exam:    VS:  BP 140/68 (BP Location: Right Arm, Patient Position: Sitting, Cuff Size: Normal)   Pulse 81   Resp 17   Ht 5\' 11"  (1.803 m)   Wt 264 lb (119.7 kg)   SpO2 94%   BMI 36.82 kg/m     Wt Readings from Last 3 Encounters:  06/02/17 264 lb (119.7 kg)  04/02/17 264 lb (119.7 kg)  03/02/17 264 lb 1.9 oz (119.8 kg)     GEN:  Well nourished, well developed in no acute distress HEENT: Normal NECK: No JVD; No carotid bruits LYMPHATICS: No lymphadenopathy CARDIAC: RRR, no murmurs, no rubs, no gallops RESPIRATORY:  Clear to auscultation without rales, wheezing or rhonchi  ABDOMEN: Soft, non-tender, non-distended MUSCULOSKELETAL:  No edema; No deformity  SKIN: Warm and dry LOWER EXTREMITIES: no swelling NEUROLOGIC:  Alert and oriented x 3 PSYCHIATRIC:  Normal affect   ASSESSMENT:    1. Essential hypertension   2. Paroxysmal atrial fibrillation (HCC)   3. Tachycardia-bradycardia syndrome ([place])   4. Obstructive sleep apnea   5. Type 2 diabetes mellitus with complication, with long-term current use of insulin (HCC)    PLAN:    In order of problems listed above:  1. Essential hypertension: Blood pressure seems to be well controlled we will continue present management. 2. Paroxysmal atrial  fibrillation: Anticoagulated which I will continue. 3. Tachycardia-bradycardia syndrome but no recent dizziness or passing out. 4. Obstructive sleep apnea recently diagnosed and appropriate therapy has been initiated 5. Diabetes: Followed by primary care physician getting better.  Overall he is improving we will continue present management see him back in 3 months   Medication Adjustments/Labs and Tests Ordered: Current medicines are reviewed at length with the patient today.  Concerns regarding medicines are outlined above.  No orders of the defined types were placed in this encounter.  Medication changes: No orders of the defined types were placed in this encounter.   Signed, Park Liter, MD, [facility] 06/02/2017 11:48 AM    [place]

## 2017-06-08 ENCOUNTER — Other Ambulatory Visit: Payer: Self-pay

## 2017-06-08 MED ORDER — CARVEDILOL 25 MG PO TABS: 12.5000 mg | ORAL_TABLET | Freq: Two times a day (BID) | ORAL | 3 refills | Status: DC

## 2017-06-29 DIAGNOSIS — Z8631 Personal history of diabetic foot ulcer: Secondary | ICD-10-CM

## 2017-06-29 HISTORY — DX: Personal history of diabetic foot ulcer: Z86.31

## 2017-08-31 ENCOUNTER — Encounter: Payer: Self-pay | Admitting: Cardiology

## 2017-08-31 ENCOUNTER — Ambulatory Visit: Payer: Medicare Other | Admitting: Cardiology

## 2017-08-31 VITALS — BP 150/66 | HR 78 | Ht 71.0 in | Wt 266.0 lb

## 2017-08-31 DIAGNOSIS — I480 Paroxysmal atrial fibrillation: Secondary | ICD-10-CM | POA: Diagnosis not present

## 2017-08-31 DIAGNOSIS — E782 Mixed hyperlipidemia: Secondary | ICD-10-CM | POA: Diagnosis not present

## 2017-08-31 DIAGNOSIS — Z7901 Long term (current) use of anticoagulants: Secondary | ICD-10-CM

## 2017-08-31 DIAGNOSIS — I10 Essential (primary) hypertension: Secondary | ICD-10-CM

## 2017-08-31 HISTORY — DX: Long term (current) use of anticoagulants: Z79.01

## 2017-08-31 NOTE — Progress Notes (Signed)
Cardiology Office Note:    Date:  08/31/2017   ID:  Louis Neal, DOB 1950/09/02, MRN [id number]  PCP:  Algis Greenhouse, MD  Cardiologist:  Shirlee More, MD    Referring MD: Algis Greenhouse, MD    ASSESSMENT:    1. Paroxysmal atrial fibrillation (HCC)   2. Essential hypertension   3. Hyperlipidemia, mixed   4. Chronic anticoagulation    PLAN:    In order of problems listed above:  1. Stable asymptomatic no clinical recurrence.  I have asked him to continue his beta-blocker despite first-degree heart block as well as his anticoagulant.  Of asked him to screen heart rate and blood pressure daily with previous orthostatic hypotension risk of bradycardia as well as looking for evidence of recurrent atrial fibrillation with rapid or irregular heart rhythm with alarm on his device.  He is asymptomatic and at this time I would not place him on antiarrhythmic drug. 2. Stable blood pressure at target repeat 140/70 with a large cuff.  Continue current antihypertensive multidrug regimen including minoxidil and hydralazine as well as a loop diuretic 3. Stable continue current lipid-lowering therapy 4. Stable continue his anticoagulant   Next appointment: 6 months   Medication Adjustments/Labs and Tests Ordered: Current medicines are reviewed at length with the patient today.  Concerns regarding medicines are outlined above.  No orders of the defined types were placed in this encounter.  No orders of the defined types were placed in this encounter.   Chief Complaint  Patient presents with  . Follow-up  . Atrial Fibrillation    History of Present Illness:    Louis Neal is a 67 y.o. male with a hx of PAF, obstructive sleep apnea  HTN on minoxidil, and T2 DM   last seen by Dr Raliegh Ip 3 months ago and found to have sleep apnea and now usiny CPAP.Marland Kitchen Compliance with diet, lifestyle and medications: yes Past Medical History:  Diagnosis Date  . Arthritis   . Arthropathy of lumbar facet  joint 07/22/2012  . 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 Bilateral lower extremity edema 02/28/2016  . Degeneration of intervertebral disc of lumbar region 07/22/2012  . Diabetes mellitus without complication ([place])   . Diaphragmatic hernia 09/04/2016  . GERD (gastroesophageal reflux disease)   . Glaucoma 02/28/2016  . High risk medication use 01/14/2016  . Hyperlipidemia, mixed 02/28/2016  . Hypertension    dr Bettina Gavia  in [place]  . Low back pain 07/22/2012  . Lumbosacral radiculopathy 07/22/2012  . Obstructive sleep apnea 02/28/2016  . Olecranon bursitis 01/13/2017   Overview:  2018: left  . Orthostatic hypotension 01/14/2016  . Pre-ulcerative calluses 10/17/2015  . Renal artery stenosis ([place]) 02/28/2016  . Stucco keratoses 06/23/2016    Past Surgical History:  Procedure Laterality Date  . LUMBAR LAMINECTOMY/DECOMPRESSION MICRODISCECTOMY Bilateral 09/22/2012   Procedure: LUMBAR LAMINECTOMY/DECOMPRESSION MICRODISCECTOMY 1 LEVEL;  Surgeon: Ophelia Charter, MD;  Location: [facility];  Service: Neurosurgery;  Laterality: Bilateral;  Lumbar two-three laminectomy  . NO PAST SURGERIES      Current Medications: Current Meds  Medication Sig  . amLODipine (NORVASC) 10 MG tablet Take 10 mg by mouth daily.  Marland Kitchen ammonium lactate (LAC-HYDRIN) 12 % lotion Apply topically 2 (two) times daily. to affected area  . apixaban (ELIQUIS) 5 MG TABS tablet Take 1 tablet (5 mg total) by mouth 2 (two) times daily.  . carvedilol (COREG) 25 MG tablet Take 0.5 tablets (12.5 mg total) 2 (two) times daily by  mouth.  . Colesevelam HCl 3.75 G PACK Take 1 packet by mouth daily.  . cyclobenzaprine (FLEXERIL) 10 MG tablet Take 1 tablet (10 mg total) by mouth 3 (three) times daily as needed for muscle spasms.  . Dulaglutide (TRULICITY) 1.5 DG/6.4QI SOPN INJECT ONCE A WEEK FOR DIABETES  . esomeprazole (NEXIUM) 40 MG capsule Take 40 mg by mouth daily.  Marland Kitchen gabapentin (NEURONTIN) 600 MG tablet Take 600 mg by mouth 2 (two) times  daily.  Marland Kitchen glucose blood (FORACARE PREMIUM V10 TEST) test strip CHECK BLOOD SUGAR FOUR TIMES DAILY  . HUMALOG KWIKPEN 100 UNIT/ML KiwkPen Inject 15 Units into the skin 3 (three) times daily before meals.  . hydrALAZINE (APRESOLINE) 25 MG tablet Take 1.5 tablets by mouth 3 times daily.  . Insulin Glargine 300 UNIT/ML SOPN Inject 80 Units into the skin daily.  Marland Kitchen latanoprost (XALATAN) 0.005 % ophthalmic solution USE ONE DROP IN AFFECTED EYE(S) EVERY DAY IN THE EVENING  . metFORMIN (GLUCOPHAGE) 1000 MG tablet Take 1,000 mg by mouth 2 (two) times daily with a meal.  . minoxidil (LONITEN) 2.5 MG tablet Take 2.5 mg by mouth every 12 (twelve) hours.  Marland Kitchen olmesartan (BENICAR) 20 MG tablet Take 1 tablet (20 mg total) by mouth daily.  Marland Kitchen omega-3 acid ethyl esters (LOVAZA) 1 g capsule Take 2 capsules by mouth 2 (two) times daily.  Marland Kitchen torsemide (DEMADEX) 20 MG tablet Take 1 tablet by mouth daily.  . urea (CARMOL) 40 % CREA Apply 1 application topically daily as needed. To feet for dry skin     Allergies:   Atorvastatin and Ezetimibe   Social History   Socioeconomic History  . Marital status: Married    Spouse name: None  . Number of children: None  . Years of education: None  . Highest education level: None  Social Needs  . Financial resource strain: None  . Food insecurity - worry: None  . Food insecurity - inability: None  . Transportation needs - medical: None  . Transportation needs - non-medical: None  Occupational History  . None  Tobacco Use  . Smoking status: Former Research scientist (life sciences)  . Smokeless tobacco: Never Used  . Tobacco comment: quit 10 years ago  Substance and Sexual Activity  . Alcohol use: No  . Drug use: No  . Sexual activity: None  Other Topics Concern  . None  Social History Narrative  . None     Family History: The patient's family history includes Cancer in his mother; Liver cancer in his father. ROS:   Please see the history of present illness.    All other systems  reviewed and are negative.  EKGs/Labs/Other Studies Reviewed:    The following studies were reviewed today:  EKG:  EKG ordered today.  The ekg ordered today demonstrates [place] first degree AVB   Recent Labs: 02/23/2017: NT-Pro BNP 123 03/02/2017: Hemoglobin 13.1; Platelets 245 03/11/2017: BUN 21; Creatinine, Ser 1.47; Potassium 4.6; Sodium 141  Recent Lipid Panel No results found for: CHOL, TRIG, HDL, CHOLHDL, VLDL, LDLCALC, LDLDIRECT  Physical Exam:    VS:  BP (!) 150/66 (BP Location: Right Arm, Patient Position: Sitting, Cuff Size: Normal)   Pulse 78   Ht 5\' 11"  (1.803 m)   Wt 266 lb (120.7 kg)   SpO2 92%   BMI 37.10 kg/m     Wt Readings from Last 3 Encounters:  08/31/17 266 lb (120.7 kg)  06/02/17 264 lb (119.7 kg)  04/02/17 264 lb (119.7 kg)  GEN:  Well nourished, well developed in no acute distress HEENT: Normal NECK: No JVD; No carotid bruits LYMPHATICS: No lymphadenopathy CARDIAC: RRR, no murmurs, rubs, gallops RESPIRATORY:  Clear to auscultation without rales, wheezing or rhonchi  ABDOMEN: Soft, non-tender, non-distended MUSCULOSKELETAL:  No edema; No deformity  SKIN: Warm and dry NEUROLOGIC:  Alert and oriented x 3 PSYCHIATRIC:  Normal affect    Signed, Shirlee More, MD  08/31/2017 3:18 PM    [facility]

## 2017-08-31 NOTE — Patient Instructions (Addendum)
Medication Instructions:  Your physician recommends that you continue on your current medications as directed. Please refer to the Current Medication list given to you today.  Check and record your HR and BP daily  Labwork: None  Testing/Procedures: You had an EKG today.  Follow-Up: Your physician wants you to follow-up in: 6 months. You will receive a reminder letter in the mail two months in advance. If you don't receive a letter, please call our office to schedule the follow-up appointment.  Any Other Special Instructions Will Be Listed Below (If Applicable).     If you need a refill on your cardiac medications before your next appointment, please call your pharmacy.     Calorie Counting for Weight Loss Calories are units of energy. Your body needs a certain amount of calories from food to keep you going throughout the day. When you eat more calories than your body needs, your body stores the extra calories as fat. When you eat fewer calories than your body needs, your body burns fat to get the energy it needs. Calorie counting means keeping track of how many calories you eat and drink each day. Calorie counting can be helpful if you need to lose weight. If you make sure to eat fewer calories than your body needs, you should lose weight. Ask your health care provider what a healthy weight is for you. For calorie counting to work, you will need to eat the right number of calories in a day in order to lose a healthy amount of weight per week. A dietitian can help you determine how many calories you need in a day and will give you suggestions on how to reach your calorie goal.  A healthy amount of weight to lose per week is usually 1-2 lb (0.5-0.9 kg). This usually means that your daily calorie intake should be reduced by 500-750 calories.  Eating 1,200 - 1,500 calories per day can help most women lose weight.  Eating 1,500 - 1,800 calories per day can help most men lose  weight.  What is my plan? My goal is to have __________ calories per day. If I have this many calories per day, I should lose around __________ pounds per week. What do I need to know about calorie counting? In order to meet your daily calorie goal, you will need to:  Find out how many calories are in each food you would like to eat. Try to do this before you eat.  Decide how much of the food you plan to eat.  Write down what you ate and how many calories it had. Doing this is called keeping a food log.  To successfully lose weight, it is important to balance calorie counting with a healthy lifestyle that includes regular activity. Aim for 150 minutes of moderate exercise (such as walking) or 75 minutes of vigorous exercise (such as running) each week. Where do I find calorie information?  The number of calories in a food can be found on a Nutrition Facts label. If a food does not have a Nutrition Facts label, try to look up the calories online or ask your dietitian for help. Remember that calories are listed per serving. If you choose to have more than one serving of a food, you will have to multiply the calories per serving by the amount of servings you plan to eat. For example, the label on a package of bread might say that a serving size is 1 slice and that there are  90 calories in a serving. If you eat 1 slice, you will have eaten 90 calories. If you eat 2 slices, you will have eaten 180 calories. How do I keep a food log? Immediately after each meal, record the following information in your food log:  What you ate. Don't forget to include toppings, sauces, and other extras on the food.  How much you ate. This can be measured in cups, ounces, or number of items.  How many calories each food and drink had.  The total number of calories in the meal.  Keep your food log near you, such as in a small notebook in your pocket, or use a mobile app or website. Some programs will calculate  calories for you and show you how many calories you have left for the day to meet your goal. What are some calorie counting tips?  Use your calories on foods and drinks that will fill you up and not leave you hungry: ? Some examples of foods that fill you up are nuts and nut butters, vegetables, lean proteins, and high-fiber foods like whole grains. High-fiber foods are foods with more than 5 g fiber per serving. ? Drinks such as sodas, specialty coffee drinks, alcohol, and juices have a lot of calories, yet do not fill you up.  Eat nutritious foods and avoid empty calories. Empty calories are calories you get from foods or beverages that do not have many vitamins or protein, such as candy, sweets, and soda. It is better to have a nutritious high-calorie food (such as an avocado) than a food with few nutrients (such as a bag of chips).  Know how many calories are in the foods you eat most often. This will help you calculate calorie counts faster.  Pay attention to calories in drinks. Low-calorie drinks include water and unsweetened drinks.  Pay attention to nutrition labels for "low fat" or "fat free" foods. These foods sometimes have the same amount of calories or more calories than the full fat versions. They also often have added sugar, starch, or salt, to make up for flavor that was removed with the fat.  Find a way of tracking calories that works for you. Get creative. Try different apps or programs if writing down calories does not work for you. What are some portion control tips?  Know how many calories are in a serving. This will help you know how many servings of a certain food you can have.  Use a measuring cup to measure serving sizes. You could also try weighing out portions on a kitchen scale. With time, you will be able to estimate serving sizes for some foods.  Take some time to put servings of different foods on your favorite plates, bowls, and cups so you know what a serving  looks like.  Try not to eat straight from a bag or box. Doing this can lead to overeating. Put the amount you would like to eat in a cup or on a plate to make sure you are eating the right portion.  Use smaller plates, glasses, and bowls to prevent overeating.  Try not to multitask (for example, watch TV or use your computer) while eating. If it is time to eat, sit down at a table and enjoy your food. This will help you to know when you are full. It will also help you to be aware of what you are eating and how much you are eating. What are tips for following this plan? Reading  food labels  Check the calorie count compared to the serving size. The serving size may be smaller than what you are used to eating.  Check the source of the calories. Make sure the food you are eating is high in vitamins and protein and low in saturated and trans fats. Shopping  Read nutrition labels while you shop. This will help you make healthy decisions before you decide to purchase your food.  Make a grocery list and stick to it. Cooking  Try to cook your favorite foods in a healthier way. For example, try baking instead of frying.  Use low-fat dairy products. Meal planning  Use more fruits and vegetables. Half of your plate should be fruits and vegetables.  Include lean proteins like poultry and fish. How do I count calories when eating out?  Ask for smaller portion sizes.  Consider sharing an entree and sides instead of getting your own entree.  If you get your own entree, eat only half. Ask for a box at the beginning of your meal and put the rest of your entree in it so you are not tempted to eat it.  If calories are listed on the menu, choose the lower calorie options.  Choose dishes that include vegetables, fruits, whole grains, low-fat dairy products, and lean protein.  Choose items that are boiled, broiled, grilled, or steamed. Stay away from items that are buttered, battered, fried, or  served with cream sauce. Items labeled "crispy" are usually fried, unless stated otherwise.  Choose water, low-fat milk, unsweetened iced tea, or other drinks without added sugar. If you want an alcoholic beverage, choose a lower calorie option such as a glass of wine or light beer.  Ask for dressings, sauces, and syrups on the side. These are usually high in calories, so you should limit the amount you eat.  If you want a salad, choose a garden salad and ask for grilled meats. Avoid extra toppings like bacon, cheese, or fried items. Ask for the dressing on the side, or ask for olive oil and vinegar or lemon to use as dressing.  Estimate how many servings of a food you are given. For example, a serving of cooked rice is  cup or about the size of half a baseball. Knowing serving sizes will help you be aware of how much food you are eating at restaurants. The list below tells you how big or small some common portion sizes are based on everyday objects: ? 1 oz-4 stacked dice. ? 3 oz-1 deck of cards. ? 1 tsp-1 die. ? 1 Tbsp- a ping-pong ball. ? 2 Tbsp-1 ping-pong ball. ?  cup- baseball. ? 1 cup-1 baseball. Summary  Calorie counting means keeping track of how many calories you eat and drink each day. If you eat fewer calories than your body needs, you should lose weight.  A healthy amount of weight to lose per week is usually 1-2 lb (0.5-0.9 kg). This usually means reducing your daily calorie intake by 500-750 calories.  The number of calories in a food can be found on a Nutrition Facts label. If a food does not have a Nutrition Facts label, try to look up the calories online or ask your dietitian for help.  Use your calories on foods and drinks that will fill you up, and not on foods and drinks that will leave you hungry.  Use smaller plates, glasses, and bowls to prevent overeating. This information is not intended to replace advice given to you by  your health care provider. Make sure you  discuss any questions you have with your health care provider. Document Released: 07/20/2005 Document Revised: 06/19/2016 Document Reviewed: 06/19/2016 Elsevier Interactive Patient Education  [employer].

## 2017-09-06 ENCOUNTER — Other Ambulatory Visit: Payer: Self-pay | Admitting: Cardiology

## 2017-09-07 ENCOUNTER — Other Ambulatory Visit: Payer: Self-pay

## 2017-09-07 DIAGNOSIS — I10 Essential (primary) hypertension: Secondary | ICD-10-CM

## 2017-09-07 MED ORDER — HYDRALAZINE HCL 25 MG PO TABS: ORAL_TABLET | ORAL | 3 refills | Status: DC

## 2017-10-06 ENCOUNTER — Other Ambulatory Visit: Payer: Self-pay | Admitting: Cardiology

## 2017-10-07 ENCOUNTER — Other Ambulatory Visit: Payer: Self-pay

## 2017-10-07 MED ORDER — TORSEMIDE 20 MG PO TABS: 20.0000 mg | ORAL_TABLET | Freq: Every day | ORAL | 3 refills | Status: DC

## 2017-10-26 DIAGNOSIS — R7989 Other specified abnormal findings of blood chemistry: Secondary | ICD-10-CM | POA: Insufficient documentation

## 2017-10-26 HISTORY — DX: Other specified abnormal findings of blood chemistry: R79.89

## 2017-10-26 NOTE — Progress Notes (Signed)
Cardiology Office Note:    Date:  10/27/2017   ID:  CEDRICK PARTAIN, DOB Nov 20, 1950, MRN [id number]  PCP:  Algis Greenhouse, MD  Cardiologist:  Shirlee More, MD    Referring MD: Algis Greenhouse, MD    ASSESSMENT:    1. Hypertensive chronic kidney disease, unspecified CKD stage   2. Elevated brain natriuretic peptide (BNP) level   3. Paroxysmal atrial fibrillation (HCC)   4. Obstructive sleep apnea   5. Essential hypertension   6. Shortness of breath    PLAN:    In order of problems listed above:  1. His BP is controlled but he has had worsening kidney function is markedly volume overloaded.  I will have him increase his hydralazine discontinue his calcium channel blocker and gabapentin.  If possible and follow-up will discontinue his minoxidil.  I expect he will have a marked diuresis and weight loss.  I am unsure whether he has heart failure as he is not short of breath his BNP level is minimally elevated with renal dysfunction and echocardiogram will be ordered along with a renal vascular duplex for severe renal artery stenosis with recent decline in GFR.  He will follow-up with me in my office in 2 weeks. 2. Modest elevation further evaluation for heart failure with echocardiogram 3. Stable no clinical recurrence 4. Stable he is quite tolerant of CPAP and will continue   Next appointment: 2 weeks, I asked him to have a BNP level and pro BNP in 1 week at his PCP office   Medication Adjustments/Labs and Tests Ordered: Current medicines are reviewed at length with the patient today.  Concerns regarding medicines are outlined above.  Orders Placed This Encounter  Procedures  . Basic metabolic panel  . Pro b natriuretic peptide (BNP)  . ECHOCARDIOGRAM COMPLETE   Meds ordered this encounter  Medications  . torsemide (DEMADEX) 20 MG tablet    Sig: Take 1 tablet (20 mg total) by mouth 2 (two) times daily.    Dispense:  180 tablet    Refill:  3    Dose was changed to once daily  vs twice daily  . hydrALAZINE (APRESOLINE) 50 MG tablet    Sig: Take 1 tablet (50 mg total) by mouth 3 (three) times daily.    Dispense:  270 tablet    Refill:  3    Chief Complaint  Patient presents with  . Follow-up    per Dr Garlon Hatchet to discuss abnormal lab work    History of Present Illness:    THEON SOBOTKA is a 67 y.o. male with a hx of  PAF, obstructive sleep apnea  HTNon minoxidil, and T2 DM last seen 3 months ago. He was seen by Dr Garlon Hatchet 10/14/17 with increased edema.  CXR IMPRESSION: COPD without acute abnormality.  Compliance with diet, lifestyle and medications: Yes He is seen in the office with increased edema but not shortness of breath.  He has multiple mechanisms including obstructive sleep apnea CKD which was recently worsened and multiple drugs contributing to sodium retention including calcium channel blocker minoxidil and gabapentin.  His weights are actually down 5 pounds despite marked edema has no orthopnea PND.  He has a background history of renal artery stenosis. Past Medical History:  Diagnosis Date  . Arthritis   . Arthropathy of lumbar facet joint 07/22/2012  . 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 Bilateral lower extremity edema 02/28/2016  . Degeneration of intervertebral disc of lumbar region 07/22/2012  . Diabetes  mellitus without complication ([place])   . Diaphragmatic hernia 09/04/2016  . GERD (gastroesophageal reflux disease)   . Glaucoma 02/28/2016  . High risk medication use 01/14/2016  . Hyperlipidemia, mixed 02/28/2016  . Hypertension    dr Bettina Gavia  in [place]  . Low back pain 07/22/2012  . Lumbosacral radiculopathy 07/22/2012  . Obstructive sleep apnea 02/28/2016  . Olecranon bursitis 01/13/2017   Overview:  2018: left  . Orthostatic hypotension 01/14/2016  . Pre-ulcerative calluses 10/17/2015  . Renal artery stenosis ([place]) 02/28/2016  . Stucco keratoses 06/23/2016    Past Surgical History:  Procedure Laterality Date  . LUMBAR  LAMINECTOMY/DECOMPRESSION MICRODISCECTOMY Bilateral 09/22/2012   Procedure: LUMBAR LAMINECTOMY/DECOMPRESSION MICRODISCECTOMY 1 LEVEL;  Surgeon: Ophelia Charter, MD;  Location: [facility];  Service: Neurosurgery;  Laterality: Bilateral;  Lumbar two-three laminectomy  . NO PAST SURGERIES      Current Medications: Current Meds  Medication Sig  . ammonium lactate (LAC-HYDRIN) 12 % lotion Apply topically 2 (two) times daily. to affected area  . apixaban (ELIQUIS) 5 MG TABS tablet Take 1 tablet (5 mg total) by mouth 2 (two) times daily.  . carvedilol (COREG) 25 MG tablet Take 0.5 tablets (12.5 mg total) 2 (two) times daily by mouth.  . Colesevelam HCl 3.75 G PACK Take 1 packet by mouth daily.  . cyclobenzaprine (FLEXERIL) 10 MG tablet Take 1 tablet (10 mg total) by mouth 3 (three) times daily as needed for muscle spasms.  . Dulaglutide (TRULICITY) 1.5 DG/3.8VF SOPN INJECT ONCE A WEEK FOR DIABETES  . esomeprazole (NEXIUM) 40 MG capsule Take 40 mg by mouth daily.  Marland Kitchen glucose blood (FORACARE PREMIUM V10 TEST) test strip CHECK BLOOD SUGAR FOUR TIMES DAILY  . HUMALOG KWIKPEN 100 UNIT/ML KiwkPen Inject 15 Units into the skin 3 (three) times daily before meals.  . Insulin Glargine 300 UNIT/ML SOPN Inject 70 Units into the skin daily.  Marland Kitchen latanoprost (XALATAN) 0.005 % ophthalmic solution USE ONE DROP IN AFFECTED EYE(S) EVERY DAY IN THE EVENING  . minoxidil (LONITEN) 2.5 MG tablet Take 2.5 mg by mouth every 12 (twelve) hours.  Marland Kitchen omega-3 acid ethyl esters (LOVAZA) 1 g capsule Take 2 capsules by mouth 2 (two) times daily.  Marland Kitchen PROAIR HFA 108 (90 Base) MCG/ACT inhaler Inhale 2 puffs into the lungs as needed for wheezing.  . torsemide (DEMADEX) 20 MG tablet Take 1 tablet (20 mg total) by mouth 2 (two) times daily.  . urea (CARMOL) 40 % CREA Apply 1 application topically daily as needed. To feet for dry skin  . [DISCONTINUED] amLODipine (NORVASC) 10 MG tablet Take 10 mg by mouth daily.  . [DISCONTINUED]  gabapentin (NEURONTIN) 600 MG tablet Take 600 mg by mouth 2 (two) times daily.  . [DISCONTINUED] hydrALAZINE (APRESOLINE) 25 MG tablet Take 1.5 tablets by mouth 3 times daily.  . [DISCONTINUED] torsemide (DEMADEX) 20 MG tablet Take 1 tablet (20 mg total) by mouth daily.     Allergies:   Atorvastatin and Ezetimibe   Social History   Socioeconomic History  . Marital status: Married    Spouse name: Not on file  . Number of children: Not on file  . Years of education: Not on file  . Highest education level: Not on file  Occupational History  . Not on file  Social Needs  . Financial resource strain: Not on file  . Food insecurity:    Worry: Not on file    Inability: Not on file  . Transportation needs:  Medical: Not on file    Non-medical: Not on file  Tobacco Use  . Smoking status: Former Research scientist (life sciences)  . Smokeless tobacco: Never Used  . Tobacco comment: quit 10 years ago  Substance and Sexual Activity  . Alcohol use: No  . Drug use: No  . Sexual activity: Not on file  Lifestyle  . Physical activity:    Days per week: Not on file    Minutes per session: Not on file  . Stress: Not on file  Relationships  . Social connections:    Talks on phone: Not on file    Gets together: Not on file    Attends religious service: Not on file    Active member of club or organization: Not on file    Attends meetings of clubs or organizations: Not on file    Relationship status: Not on file  Other Topics Concern  . Not on file  Social History Narrative  . Not on file     Family History: The patient's family history includes Cancer in his mother; Liver cancer in his father. ROS:   Please see the history of present illness.    All other systems reviewed and are negative.  EKGs/Labs/Other Studies Reviewed:    The following studies were reviewed today:   Recent Labs:   10/21/17 BNP 123 Cr 1.93 K 4.8 02/23/2017: NT-Pro BNP 123 03/02/2017: Hemoglobin 13.1; Platelets 245 03/11/2017: BUN  21; Creatinine, Ser 1.47; Potassium 4.6; Sodium 141  Recent Lipid Panel   09/21/17 Chol 154 HDL 36 LDL 87 No results found for: CHOL, TRIG, HDL, CHOLHDL, VLDL, LDLCALC, LDLDIRECT  Physical Exam:    VS:  BP (!) 148/70 (BP Location: Right Arm, Patient Position: Sitting, Cuff Size: Large)   Pulse 70   Ht 5\' 11"  (1.803 m)   Wt 267 lb 6.4 oz (121.3 kg)   SpO2 98%   BMI 37.29 kg/m     Wt Readings from Last 3 Encounters:  10/27/17 267 lb 6.4 oz (121.3 kg)  08/31/17 266 lb (120.7 kg)  06/02/17 264 lb (119.7 kg)     GEN: Anasarca  Well nourished, well developed in no acute distress HEENT: Normal NECK: No JVD; No carotid bruits LYMPHATICS: No lymphadenopathy CARDIAC: RRR, no murmurs, rubs, gallops RESPIRATORY:  Clear to auscultation without rales, wheezing or rhonchi  ABDOMEN: Soft, non-tender, non-distended MUSCULOSKELETAL:  4+ tense edema legs to the hip edema; No deformity  SKIN: Warm and dry NEUROLOGIC:  Alert and oriented x 3 PSYCHIATRIC:  Normal affect    Signed, Shirlee More, MD  10/27/2017 11:06 AM    [place]

## 2017-10-27 ENCOUNTER — Ambulatory Visit: Payer: Medicare Other | Admitting: Cardiology

## 2017-10-27 ENCOUNTER — Encounter: Payer: Self-pay | Admitting: Cardiology

## 2017-10-27 VITALS — BP 148/70 | HR 70 | Ht 71.0 in | Wt 267.4 lb

## 2017-10-27 DIAGNOSIS — I129 Hypertensive chronic kidney disease with stage 1 through stage 4 chronic kidney disease, or unspecified chronic kidney disease: Secondary | ICD-10-CM

## 2017-10-27 DIAGNOSIS — I10 Essential (primary) hypertension: Secondary | ICD-10-CM

## 2017-10-27 DIAGNOSIS — R7989 Other specified abnormal findings of blood chemistry: Secondary | ICD-10-CM | POA: Diagnosis not present

## 2017-10-27 DIAGNOSIS — I480 Paroxysmal atrial fibrillation: Secondary | ICD-10-CM

## 2017-10-27 DIAGNOSIS — R0602 Shortness of breath: Secondary | ICD-10-CM | POA: Diagnosis not present

## 2017-10-27 DIAGNOSIS — G4733 Obstructive sleep apnea (adult) (pediatric): Secondary | ICD-10-CM | POA: Diagnosis not present

## 2017-10-27 MED ORDER — TORSEMIDE 20 MG PO TABS: 20.0000 mg | ORAL_TABLET | Freq: Two times a day (BID) | ORAL | 3 refills | Status: DC

## 2017-10-27 MED ORDER — HYDRALAZINE HCL 50 MG PO TABS: 50.0000 mg | ORAL_TABLET | Freq: Three times a day (TID) | ORAL | 3 refills | Status: DC

## 2017-10-27 NOTE — Patient Instructions (Addendum)
Medication Instructions:  Your physician has recommended you make the following change in your medication:  STOP gabapentin STOP amlodipine INCREASE torsemide (Demadex) to 20 mg twice daily INCREASE hydraalazine to 50 mg three times daily  Labwork: Your physician recommends that you return for lab work in: 1 week at Dr. Arna Medici office. BMP, BNP  Testing/Procedures: Your physician has requested that you have an echocardiogram. Echocardiography is a painless test that uses sound waves to create images of your heart. It provides your doctor with information about the size and shape of your heart and how well your heart's chambers and valves are working. This procedure takes approximately one hour. There are no restrictions for this procedure.  Your physician has requested that you have a renal artery duplex. During this test, an ultrasound is used to evaluate blood flow to the kidneys. Allow one hour for this exam. Do not eat after midnight the day before and avoid carbonated beverages. Take your medications as you usually do.  Follow-Up: Your physician recommends that you schedule a follow-up appointment in: 2 weeks.  Any Other Special Instructions Will Be Listed Below (If Applicable).     If you need a refill on your cardiac medications before your next appointment, please call your pharmacy.

## 2017-11-09 ENCOUNTER — Telehealth: Payer: Self-pay

## 2017-11-09 NOTE — Telephone Encounter (Signed)
-----   Message from Richardo Priest, MD sent at 11/09/2017  3:57 PM EDT ----- Regarding: FW: labs BMP and BNP are good ----- Message ----- From: Jossie Ng, RN Sent: 11/09/2017   3:29 PM To: Richardo Priest, MD Subject: labs                                           Repeat BMP, BNP in Care Everywhere under [facility] for review.

## 2017-11-09 NOTE — Telephone Encounter (Signed)
Patient advised of stable results per Dr. Bettina Gavia. Patient verbalized understanding, no further questions.

## 2017-11-23 ENCOUNTER — Other Ambulatory Visit (HOSPITAL_BASED_OUTPATIENT_CLINIC_OR_DEPARTMENT_OTHER): Payer: Medicare Other

## 2017-11-23 ENCOUNTER — Telehealth: Payer: Self-pay | Admitting: Cardiology

## 2017-11-23 NOTE — Telephone Encounter (Signed)
Please call about other orders for imaging

## 2017-11-23 NOTE — Telephone Encounter (Signed)
Patient states that he seen Dr. Olivia Mackie for the first time last week. Patient states that Dr. Olivia Mackie told him that he has stage 3 chronic kidney disease and is borderline being able to reverse anything. Patient states that Dr. Olivia Mackie mentioned something about adding further tests. Patient is scheduled for an echocardiogram and renal artery duplex. Dr. Judithann Sauger note is in [place]. Please advise.

## 2017-11-23 NOTE — Telephone Encounter (Signed)
Advised patient that per Dr. Judithann Sauger note, the renal artery duplex was already ordered and he is just waiting on results. Patient verbalized understanding, no further questions.

## 2017-11-23 NOTE — Telephone Encounter (Signed)
These are the tests he recommends allready scheduled

## 2017-11-25 ENCOUNTER — Ambulatory Visit: Payer: Medicare Other | Admitting: Cardiology

## 2017-11-26 ENCOUNTER — Ambulatory Visit (HOSPITAL_BASED_OUTPATIENT_CLINIC_OR_DEPARTMENT_OTHER)
Admission: RE | Admit: 2017-11-26 | Discharge: 2017-11-26 | Disposition: A | Discharge status: Home / Self Care | Payer: Medicare Other | Source: Ambulatory Visit | Attending: Cardiology | Admitting: Cardiology

## 2017-11-26 DIAGNOSIS — I129 Hypertensive chronic kidney disease with stage 1 through stage 4 chronic kidney disease, or unspecified chronic kidney disease: Secondary | ICD-10-CM

## 2017-11-26 DIAGNOSIS — E1122 Type 2 diabetes mellitus with diabetic chronic kidney disease: Secondary | ICD-10-CM | POA: Diagnosis not present

## 2017-11-26 DIAGNOSIS — I4891 Unspecified atrial fibrillation: Secondary | ICD-10-CM | POA: Insufficient documentation

## 2017-11-26 DIAGNOSIS — R0602 Shortness of breath: Secondary | ICD-10-CM | POA: Diagnosis not present

## 2017-11-26 DIAGNOSIS — I701 Atherosclerosis of renal artery: Secondary | ICD-10-CM | POA: Insufficient documentation

## 2017-11-26 DIAGNOSIS — I1310 Hypertensive heart and chronic kidney disease without heart failure, with stage 1 through stage 4 chronic kidney disease, or unspecified chronic kidney disease: Secondary | ICD-10-CM | POA: Insufficient documentation

## 2017-11-26 DIAGNOSIS — N189 Chronic kidney disease, unspecified: Secondary | ICD-10-CM | POA: Insufficient documentation

## 2017-11-26 DIAGNOSIS — I340 Nonrheumatic mitral (valve) insufficiency: Secondary | ICD-10-CM | POA: Diagnosis not present

## 2017-11-26 NOTE — Progress Notes (Signed)
Echocardiogram 2D Echocardiogram has been performed.  Joelene Millin 11/26/2017, 9:37 AM

## 2017-11-26 NOTE — Progress Notes (Signed)
  Renal artery duplex performed. Louis Neal 11/26/2017, 9:32 AM

## 2017-11-29 NOTE — Progress Notes (Signed)
Cardiology Office Note:    Date:  11/30/2017   ID:  Louis Neal, DOB Apr 22, 1951, MRN [id number]  PCP:  Algis Greenhouse, MD  Cardiologist:  Shirlee More, MD    Referring MD: Algis Greenhouse, MD    ASSESSMENT:    1. Hypertensive heart disease with heart failure ([place])   2. Renal artery stenosis ([place])   3. CKD (chronic kidney disease) stage 3, GFR 30-59 ml/min (HCC)    PLAN:    In order of problems listed above:  1. Improved home blood pressures at target 1 3888 systolic he has no evidence of volume overload continue his current antihypertensives and clearly in the setting of minoxidil he developed fluid overload and heart failure which is nicely compensated.  Echocardiogram shows preserved ejection fraction mild LVH.  He will continue his current loop diuretic and antihypertensives 2. Worsened his velocities are consistent with bilateral greater than 60% stenosis however his aortic velocities were high the test is equivocal I called and reviewed with his nephrologist as opposed to doing CT angiogram for renal angiogram will follow renal function and reassess with a follow-up duplex in 3 months as he has no evidence of fluid overload his creatinine is been stable over the last 6 months and hypertension is at target. 3. Stable managed by nephrology   Next appointment: 4 weeks   Medication Adjustments/Labs and Tests Ordered: Current medicines are reviewed at length with the patient today.  Concerns regarding medicines are outlined above.  No orders of the defined types were placed in this encounter.  No orders of the defined types were placed in this encounter.   Chief Complaint  Patient presents with  . Follow-up    History of Present Illness:    Louis Neal is a 67 y.o. male with a hx of PAF, obstructive sleep apneaHTNon minoxidil, stage 3 CKD and T2 DM last seen 10/27/17.  ASSESSMENT:    10/27/17   1. Hypertensive chronic kidney disease, unspecified CKD stage    2. Elevated brain natriuretic peptide (BNP) level   3. Paroxysmal atrial fibrillation (HCC)   4. Obstructive sleep apnea   5. Essential hypertension   6. Shortness of breath    PLAN:    In order of problems listed above:  1. His BP is controlled but he has had worsening kidney function is markedly volume overloaded.  I will have him increase his hydralazine discontinue his calcium channel blocker and gabapentin.  If possible and follow-up will discontinue his minoxidil.  I expect he will have a marked diuresis and weight loss.  I am unsure whether he has heart failure as he is not short of breath his BNP level is minimally elevated with renal dysfunction and echocardiogram will be ordered along with a renal vascular duplex for severe renal artery stenosis with recent decline in GFR.  He will follow-up with me in my office in 2 weeks. 2. Modest elevation further evaluation for heart failure with echocardiogram 3. Stable no clinical recurrence 4. Stable he is quite tolerant of CPAP and will continue  Compliance with diet, lifestyle and medications: Yes He is improved weight is down in excess of 30 pounds no shortness of breath or edema and blood pressure is 1 140-150/70-80.  I reviewed the results of his recent renal duplex and discussed the case with nephrology. Past Medical History:  Diagnosis Date  . Arthritis   . Arthropathy of lumbar facet joint 07/22/2012  . 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  Bilateral lower extremity edema 02/28/2016  . Degeneration of intervertebral disc of lumbar region 07/22/2012  . Diabetes mellitus without complication ([place])   . Diaphragmatic hernia 09/04/2016  . GERD (gastroesophageal reflux disease)   . Glaucoma 02/28/2016  . High risk medication use 01/14/2016  . Hyperlipidemia, mixed 02/28/2016  . Hypertension    dr Bettina Gavia  in [place]  . Low back pain 07/22/2012  . Lumbosacral radiculopathy 07/22/2012  . Obstructive sleep apnea 02/28/2016  . Olecranon  bursitis 01/13/2017   Overview:  2018: left  . Orthostatic hypotension 01/14/2016  . Pre-ulcerative calluses 10/17/2015  . Renal artery stenosis ([place]) 02/28/2016  . Stucco keratoses 06/23/2016    Past Surgical History:  Procedure Laterality Date  . LUMBAR LAMINECTOMY/DECOMPRESSION MICRODISCECTOMY Bilateral 09/22/2012   Procedure: LUMBAR LAMINECTOMY/DECOMPRESSION MICRODISCECTOMY 1 LEVEL;  Surgeon: Ophelia Charter, MD;  Location: [facility];  Service: Neurosurgery;  Laterality: Bilateral;  Lumbar two-three laminectomy  . NO PAST SURGERIES      Current Medications: Current Meds  Medication Sig  . ammonium lactate (LAC-HYDRIN) 12 % lotion Apply topically 2 (two) times daily. to affected area  . apixaban (ELIQUIS) 5 MG TABS tablet Take 1 tablet (5 mg total) by mouth 2 (two) times daily.  . carvedilol (COREG) 25 MG tablet Take 0.5 tablets (12.5 mg total) 2 (two) times daily by mouth.  . Cholecalciferol (VITAMIN D) 2000 units tablet Take 1 tablet by mouth daily.  . Colesevelam HCl 3.75 G PACK Take 1 packet by mouth daily.  . cyclobenzaprine (FLEXERIL) 10 MG tablet Take 1 tablet (10 mg total) by mouth 3 (three) times daily as needed for muscle spasms.  . Dulaglutide (TRULICITY) 1.5 WV/3.7TG SOPN INJECT ONCE A WEEK FOR DIABETES  . esomeprazole (NEXIUM) 40 MG capsule Take 40 mg by mouth daily.  Marland Kitchen glucose blood (FORACARE PREMIUM V10 TEST) test strip CHECK BLOOD SUGAR FOUR TIMES DAILY  . HUMALOG KWIKPEN 100 UNIT/ML KiwkPen Inject 10 Units into the skin as directed.   . hydrALAZINE (APRESOLINE) 100 MG tablet TAKE ONE TABLET BY MOUTH 3 TIMES DAILY  . Insulin Glargine 300 UNIT/ML SOPN Inject 40 Units into the skin daily.   Marland Kitchen latanoprost (XALATAN) 0.005 % ophthalmic solution USE ONE DROP IN AFFECTED EYE(S) EVERY DAY IN THE EVENING  . omega-3 acid ethyl esters (LOVAZA) 1 g capsule Take 2 capsules by mouth 2 (two) times daily.  Marland Kitchen PROAIR HFA 108 (90 Base) MCG/ACT inhaler Inhale 2 puffs into the lungs as  needed for wheezing.  . torsemide (DEMADEX) 20 MG tablet Take 1 tablet (20 mg total) by mouth 2 (two) times daily.     Allergies:   Atorvastatin and Ezetimibe   Social History   Socioeconomic History  . Marital status: Married    Spouse name: Not on file  . Number of children: Not on file  . Years of education: Not on file  . Highest education level: Not on file  Occupational History  . Not on file  Social Needs  . Financial resource strain: Not on file  . Food insecurity:    Worry: Not on file    Inability: Not on file  . Transportation needs:    Medical: Not on file    Non-medical: Not on file  Tobacco Use  . Smoking status: Former Research scientist (life sciences)  . Smokeless tobacco: Never Used  . Tobacco comment: quit 10 years ago  Substance and Sexual Activity  . Alcohol use: No  . Drug use: No  . Sexual activity:  Not on file  Lifestyle  . Physical activity:    Days per week: Not on file    Minutes per session: Not on file  . Stress: Not on file  Relationships  . Social connections:    Talks on phone: Not on file    Gets together: Not on file    Attends religious service: Not on file    Active member of club or organization: Not on file    Attends meetings of clubs or organizations: Not on file    Relationship status: Not on file  Other Topics Concern  . Not on file  Social History Narrative  . Not on file     Family History: The patient's family history includes Cancer in his mother; Liver cancer in his father. ROS:   Please see the history of present illness.    All other systems reviewed and are negative.  EKGs/Labs/Other Studies Reviewed:    The following studies were reviewed today:  Echo: Study Conclusions - Left ventricle: The cavity size was normal. There was mild   concentric hypertrophy. Systolic function was normal. Wall motion   was normal; there were no regional wall motion abnormalities. The   study is not technically sufficient to allow evaluation of LV    diastolic function. - Mitral valve: There was moderate regurgitation. - Left atrium: The atrium was moderately dilated.  Renal arterial duplex: Right: Normal size right kidney.    Stenosis (most likely >60%) involving origin and proximal    portion of the Rt renal artery noted. Left: Normal size of left kidney.    Stenosis (most lokely >60%) involving origin and proximal    portion of the lt. renal artery noted.  Recent Labs: 02/23/2017: NT-Pro BNP 123 03/02/2017: Hemoglobin 13.1; Platelets 245 03/11/2017: BUN 21; Creatinine, Ser 1.47; Potassium 4.6; Sodium 141  Recent Lipid Panel No results found for: CHOL, TRIG, HDL, CHOLHDL, VLDL, LDLCALC, LDLDIRECT  Physical Exam:    VS:  BP 138/72 (BP Location: Right Arm, Patient Position: Sitting, Cuff Size: Large)   Pulse (!) 59   Ht 5\' 11"  (1.803 m)   Wt 233 lb 1.9 oz (105.7 kg)   SpO2 96%   BMI 32.51 kg/m     Wt Readings from Last 3 Encounters:  11/30/17 233 lb 1.9 oz (105.7 kg)  10/27/17 267 lb 6.4 oz (121.3 kg)  08/31/17 266 lb (120.7 kg)     GEN:  Well nourished, well developed in no acute distress HEENT: Normal NECK: No JVD; No carotid bruits LYMPHATICS: No lymphadenopathy CARDIAC: RRR, no murmurs, rubs, gallops RESPIRATORY:  Clear to auscultation without rales, wheezing or rhonchi  ABDOMEN: Soft, non-tender, non-distended MUSCULOSKELETAL:  No edema; No deformity  SKIN: Warm and dry NEUROLOGIC:  Alert and oriented x 3 PSYCHIATRIC:  Normal affect    Signed, Shirlee More, MD  11/30/2017 11:01 AM    [place]

## 2017-11-30 ENCOUNTER — Ambulatory Visit: Payer: Medicare Other | Admitting: Cardiology

## 2017-11-30 ENCOUNTER — Encounter: Payer: Self-pay | Admitting: Cardiology

## 2017-11-30 VITALS — BP 138/72 | HR 59 | Ht 71.0 in | Wt 233.1 lb

## 2017-11-30 DIAGNOSIS — I110 Hypertensive heart disease with heart failure: Secondary | ICD-10-CM

## 2017-11-30 DIAGNOSIS — N183 Chronic kidney disease, stage 3 unspecified: Secondary | ICD-10-CM

## 2017-11-30 DIAGNOSIS — I701 Atherosclerosis of renal artery: Secondary | ICD-10-CM | POA: Diagnosis not present

## 2017-11-30 HISTORY — DX: Chronic kidney disease, stage 3 unspecified: N18.30

## 2017-11-30 NOTE — Patient Instructions (Signed)
Medication Instructions:  Your physician recommends that you continue on your current medications as directed. Please refer to the Current Medication list given to you today.   Labwork: NONE  Testing/Procedures: NONE  Follow-Up: Your physician recommends that you schedule a follow-up appointment in: 1 month   Any Other Special Instructions Will Be Listed Below (If Applicable).     If you need a refill on your cardiac medications before your next appointment, please call your pharmacy.

## 2017-12-01 ENCOUNTER — Telehealth: Payer: Self-pay

## 2017-12-01 DIAGNOSIS — N183 Chronic kidney disease, stage 3 unspecified: Secondary | ICD-10-CM

## 2017-12-01 DIAGNOSIS — I701 Atherosclerosis of renal artery: Secondary | ICD-10-CM

## 2017-12-01 NOTE — Telephone Encounter (Signed)
Order placed for renal artery duplex. Recall placed in system for 3 months to schedule appointment.

## 2017-12-01 NOTE — Telephone Encounter (Signed)
-----   Message from Richardo Priest, MD sent at 11/30/2017 11:09 AM EDT ----- I called and discussed with nephrology, will hold on angiogram and recheck duplex 3 months office as he is doing well

## 2017-12-15 DIAGNOSIS — M898X9 Other specified disorders of bone, unspecified site: Secondary | ICD-10-CM | POA: Insufficient documentation

## 2017-12-15 DIAGNOSIS — R809 Proteinuria, unspecified: Secondary | ICD-10-CM

## 2017-12-15 DIAGNOSIS — E889 Metabolic disorder, unspecified: Secondary | ICD-10-CM

## 2017-12-15 DIAGNOSIS — E559 Vitamin D deficiency, unspecified: Secondary | ICD-10-CM

## 2017-12-15 DIAGNOSIS — R801 Persistent proteinuria, unspecified: Secondary | ICD-10-CM

## 2017-12-15 HISTORY — DX: Proteinuria, unspecified: R80.9

## 2017-12-15 HISTORY — DX: Persistent proteinuria, unspecified: R80.1

## 2017-12-15 HISTORY — DX: Vitamin D deficiency, unspecified: E55.9

## 2017-12-15 HISTORY — DX: Metabolic disorder, unspecified: E88.9

## 2017-12-15 HISTORY — DX: Other specified disorders of bone, unspecified site: M89.8X9

## 2018-01-03 NOTE — Progress Notes (Signed)
Cardiology Office Note:    Date:  01/04/2018   ID:  Louis Neal, DOB 1951/05/07, MRN [id number]  PCP:  Algis Greenhouse, MD  Cardiologist:  Shirlee More, MD    Referring MD: Algis Greenhouse, MD    ASSESSMENT:    1. Hypertensive heart disease with heart failure ([place])   2. Renal artery stenosis ([place])   3. Chronic anticoagulation   4. Paroxysmal atrial fibrillation (HCC)    PLAN:    In order of problems listed above:  1. Stable weight is at baseline at home blood pressure less than 169 systolic continue current medical treatment. 2. Recheck renal vascular duplex in July, at this time I would not represent intervention as his heart failure is compensated renal function stable and blood pressure is controlled 3. Stable continue his anticoagulant 4. Stable remains in sinus rhythm   Next appointment: 6 months   Medication Adjustments/Labs and Tests Ordered: Current medicines are reviewed at length with the patient today.  Concerns regarding medicines are outlined above.  No orders of the defined types were placed in this encounter.  No orders of the defined types were placed in this encounter.   Chief Complaint  Patient presents with  . Follow-up    renal artery stenosis  . Congestive Heart Failure  . Hypertension  . Chronic Kidney Disease    History of Present Illness:    Louis Neal is a 67 y.o. male with a hx of  last seen 11/30/17.  ASSESSMENT:    11/30/17   1. Hypertensive heart disease with heart failure ([place])   2. Renal artery stenosis ([place])   3. CKD (chronic kidney disease) stage 3, GFR 30-59 ml/min (HCC)    PLAN:    In order of problems listed above:  1.    Improved home blood pressures at target 678-938 systolic he has no evidence of volume overload continue his current antihypertensives and clearly in the setting of minoxidil he developed fluid overload and heart failure which is nicely compensated.  Echocardiogram shows preserved ejection  fraction mild LVH.  He will continue his current loop diuretic and antihypertensives 5. Worsened his velocities are consistent with bilateral greater than 60% stenosis however his aortic velocities were high the test is equivocal I called and reviewed with his nephrologist as opposed to doing CT angiogram for renal angiogram will follow renal function and reassess with a follow-up duplex in 3 months as he has no evidence of fluid overload his creatinine is been stable over the last 6 months and hypertension is at target. 6. Stable managed by nephrology  Compliance with diet, lifestyle and medications: Yes, Weight sytable 130 lbs, BP 101 systolic  Had no edema shortness of breath chest pain palpitation or syncope recent labs with his nephrologist reviewed during the visit Past Medical History:  Diagnosis Date  . Arthritis   . Arthropathy of lumbar facet joint 07/22/2012  . 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 Bilateral lower extremity edema 02/28/2016  . Degeneration of intervertebral disc of lumbar region 07/22/2012  . Diabetes mellitus without complication ([place])   . Diaphragmatic hernia 09/04/2016  . GERD (gastroesophageal reflux disease)   . Glaucoma 02/28/2016  . High risk medication use 01/14/2016  . Hyperlipidemia, mixed 02/28/2016  . Hypertension    dr Bettina Gavia  in [place]  . Low back pain 07/22/2012  . Lumbosacral radiculopathy 07/22/2012  . Obstructive sleep apnea 02/28/2016  . Olecranon bursitis 01/13/2017   Overview:  2018: left  .  Orthostatic hypotension 01/14/2016  . Pre-ulcerative calluses 10/17/2015  . Renal artery stenosis ([place]) 02/28/2016  . Stucco keratoses 06/23/2016    Past Surgical History:  Procedure Laterality Date  . LUMBAR LAMINECTOMY/DECOMPRESSION MICRODISCECTOMY Bilateral 09/22/2012   Procedure: LUMBAR LAMINECTOMY/DECOMPRESSION MICRODISCECTOMY 1 LEVEL;  Surgeon: Ophelia Charter, MD;  Location: [facility];  Service: Neurosurgery;  Laterality: Bilateral;  Lumbar two-three  laminectomy  . NO PAST SURGERIES      Current Medications: Current Meds  Medication Sig  . ammonium lactate (LAC-HYDRIN) 12 % lotion Apply topically 2 (two) times daily. to affected area  . apixaban (ELIQUIS) 5 MG TABS tablet Take 1 tablet (5 mg total) by mouth 2 (two) times daily.  . carvedilol (COREG) 25 MG tablet Take 0.5 tablets (12.5 mg total) 2 (two) times daily by mouth.  . Cholecalciferol (VITAMIN D) 2000 units tablet Take 1 tablet by mouth daily.  . Colesevelam HCl 3.75 G PACK Take 1 packet by mouth daily.  . Dulaglutide (TRULICITY) 1.5 JK/0.9FG SOPN INJECT ONCE A WEEK FOR DIABETES  . esomeprazole (NEXIUM) 40 MG capsule Take 40 mg by mouth daily.  Marland Kitchen glucose blood (FORACARE PREMIUM V10 TEST) test strip CHECK BLOOD SUGAR FOUR TIMES DAILY  . HUMALOG KWIKPEN 100 UNIT/ML KiwkPen Inject 10 Units into the skin as directed.   . hydrALAZINE (APRESOLINE) 100 MG tablet TAKE ONE TABLET BY MOUTH 3 TIMES DAILY  . Insulin Glargine 300 UNIT/ML SOPN Inject 40 Units into the skin daily.   Marland Kitchen latanoprost (XALATAN) 0.005 % ophthalmic solution USE ONE DROP IN AFFECTED EYE(S) EVERY DAY IN THE EVENING  . omega-3 acid ethyl esters (LOVAZA) 1 g capsule Take 2 capsules by mouth 2 (two) times daily.  Marland Kitchen PROAIR HFA 108 (90 Base) MCG/ACT inhaler Inhale 2 puffs into the lungs as needed for wheezing.  . torsemide (DEMADEX) 20 MG tablet Take 1 tablet (20 mg total) by mouth 2 (two) times daily.     Allergies:   Atorvastatin and Ezetimibe   Social History   Socioeconomic History  . Marital status: Married    Spouse name: Not on file  . Number of children: Not on file  . Years of education: Not on file  . Highest education level: Not on file  Occupational History  . Not on file  Social Needs  . Financial resource strain: Not on file  . Food insecurity:    Worry: Not on file    Inability: Not on file  . Transportation needs:    Medical: Not on file    Non-medical: Not on file  Tobacco Use  . Smoking  status: Former Research scientist (life sciences)  . Smokeless tobacco: Never Used  . Tobacco comment: quit 10 years ago  Substance and Sexual Activity  . Alcohol use: No  . Drug use: No  . Sexual activity: Not on file  Lifestyle  . Physical activity:    Days per week: Not on file    Minutes per session: Not on file  . Stress: Not on file  Relationships  . Social connections:    Talks on phone: Not on file    Gets together: Not on file    Attends religious service: Not on file    Active member of club or organization: Not on file    Attends meetings of clubs or organizations: Not on file    Relationship status: Not on file  Other Topics Concern  . Not on file  Social History Narrative  . Not on file  Family History: The patient's family history includes Cancer in his mother; Liver cancer in his father. ROS:   Please see the history of present illness.    All other systems reviewed and are negative.  EKGs/Labs/Other Studies Reviewed:    The following studies were reviewed today:   Recent Labs: 12/09/17 stable Cr 1.93 K 4.0 02/23/2017: NT-Pro BNP 123 03/02/2017: Hemoglobin 13.1; Platelets 245 03/11/2017: BUN 21; Creatinine, Ser 1.47; Potassium 4.6; Sodium 141  Recent Lipid Panel No results found for: CHOL, TRIG, HDL, CHOLHDL, VLDL, LDLCALC, LDLDIRECT  Physical Exam:    VS:  BP (!) 150/76 (BP Location: Right Arm, Patient Position: Sitting, Cuff Size: Large)   Pulse (!) 59   Ht 5\' 11"  (1.803 m)   Wt 235 lb (106.6 kg)   SpO2 99%   BMI 32.78 kg/m     Wt Readings from Last 3 Encounters:  01/04/18 235 lb (106.6 kg)  11/30/17 233 lb 1.9 oz (105.7 kg)  10/27/17 267 lb 6.4 oz (121.3 kg)     GEN:  Well nourished, well developed in no acute distress HEENT: Normal NECK: No JVD; No carotid bruits LYMPHATICS: No lymphadenopathy CARDIAC: RRR, no murmurs, rubs, gallops RESPIRATORY:  Clear to auscultation without rales, wheezing or rhonchi  ABDOMEN: Soft, non-tender, non-distended MUSCULOSKELETAL:   No edema; No deformity  SKIN: Warm and dry NEUROLOGIC:  Alert and oriented x 3 PSYCHIATRIC:  Normal affect    Signed, Shirlee More, MD  01/04/2018 8:54 AM    [place]

## 2018-01-04 ENCOUNTER — Ambulatory Visit: Payer: Medicare Other | Admitting: Cardiology

## 2018-01-04 ENCOUNTER — Encounter: Payer: Self-pay | Admitting: Cardiology

## 2018-01-04 VITALS — BP 150/76 | HR 59 | Ht 71.0 in | Wt 235.0 lb

## 2018-01-04 DIAGNOSIS — Z7901 Long term (current) use of anticoagulants: Secondary | ICD-10-CM | POA: Diagnosis not present

## 2018-01-04 DIAGNOSIS — I701 Atherosclerosis of renal artery: Secondary | ICD-10-CM

## 2018-01-04 DIAGNOSIS — I110 Hypertensive heart disease with heart failure: Secondary | ICD-10-CM | POA: Diagnosis not present

## 2018-01-04 DIAGNOSIS — I480 Paroxysmal atrial fibrillation: Secondary | ICD-10-CM | POA: Diagnosis not present

## 2018-01-04 NOTE — Patient Instructions (Addendum)
Medication Instructions:  Your physician recommends that you continue on your current medications as directed. Please refer to the Current Medication list given to you today.   Labwork: NONE  Testing/Procedures: Your physician has requested that you have a renal artery duplex. During this test, an ultrasound is used to evaluate blood flow to the kidneys. Allow one hour for this exam. Do not eat after midnight the day before and avoid carbonated beverages. Take your medications as you usually do.    Follow-Up: Your physician wants you to follow-up in: 6 months. You will receive a reminder letter in the mail two months in advance. If you don't receive a letter, please call our office to schedule the follow-up appointment.   Any Other Special Instructions Will Be Listed Below (If Applicable).     If you need a refill on your cardiac medications before your next appointment, please call your pharmacy.  Heart Failure  Weigh yourself every morning when you first wake up and record on a calender or note pad, bring this to your office visits. Using a pill tender can help with taking your medications consistently.  Limit your fluid intake to 2 liters daily  Limit your sodium intake to less than 2-3 grams daily. Ask if you need dietary teaching.  If you gain more than 3 pounds (from your dry weight ), double your dose of diuretic for the day.  If you gain more than 5 pounds (from your dry weight), double your dose of lasix and call your heart failure doctor.  Please do not smoke tobacco since it is very bad for your heart.  Please do not drink alcohol since it can worsen your heart failure.Also avoid OTC nonsteroidal drugs, such as advil, aleve and motrin.  Try to exercise for at least 30 minutes every day because this will help your heart be more efficient. You may be eligible for supervised cardiac rehab, ask your physician.

## 2018-02-02 ENCOUNTER — Ambulatory Visit (INDEPENDENT_AMBULATORY_CARE_PROVIDER_SITE_OTHER): Payer: Medicare Other

## 2018-02-02 DIAGNOSIS — I701 Atherosclerosis of renal artery: Secondary | ICD-10-CM

## 2018-02-02 NOTE — Progress Notes (Signed)
Renal artery duplex performed.   [place]

## 2018-03-08 ENCOUNTER — Other Ambulatory Visit: Payer: Self-pay | Admitting: Cardiology

## 2018-03-08 DIAGNOSIS — I129 Hypertensive chronic kidney disease with stage 1 through stage 4 chronic kidney disease, or unspecified chronic kidney disease: Secondary | ICD-10-CM

## 2018-03-08 DIAGNOSIS — N183 Chronic kidney disease, stage 3 unspecified: Secondary | ICD-10-CM | POA: Insufficient documentation

## 2018-03-08 DIAGNOSIS — E875 Hyperkalemia: Secondary | ICD-10-CM

## 2018-03-08 HISTORY — DX: Hypertensive chronic kidney disease with stage 1 through stage 4 chronic kidney disease, or unspecified chronic kidney disease: I12.9

## 2018-03-08 HISTORY — DX: Hyperkalemia: E87.5

## 2018-03-29 DIAGNOSIS — L97511 Non-pressure chronic ulcer of other part of right foot limited to breakdown of skin: Secondary | ICD-10-CM

## 2018-03-29 HISTORY — DX: Non-pressure chronic ulcer of other part of right foot limited to breakdown of skin: L97.511

## 2018-07-05 ENCOUNTER — Other Ambulatory Visit: Payer: Self-pay | Admitting: Cardiology

## 2018-07-08 DIAGNOSIS — Z0000 Encounter for general adult medical examination without abnormal findings: Secondary | ICD-10-CM | POA: Insufficient documentation

## 2018-07-08 HISTORY — DX: Encounter for general adult medical examination without abnormal findings: Z00.00

## 2018-08-04 ENCOUNTER — Other Ambulatory Visit: Payer: Self-pay | Admitting: Cardiology

## 2018-09-01 ENCOUNTER — Other Ambulatory Visit: Payer: Self-pay | Admitting: Cardiology

## 2018-09-05 ENCOUNTER — Other Ambulatory Visit: Payer: Self-pay | Admitting: Cardiology

## 2018-09-08 NOTE — Progress Notes (Signed)
Cardiology Office Note:    Date:  09/09/2018   ID:  Louis Neal, DOB [birth year], MRN [id number]  PCP:  Algis Greenhouse, MD  Cardiologist:  Shirlee More, MD    Referring MD: Algis Greenhouse, MD    ASSESSMENT:    1. Paroxysmal atrial fibrillation (HCC)   2. Hypertensive chronic kidney disease, unspecified CKD stage   3. Hypertensive heart disease with heart failure ([place])    PLAN:    In order of problems listed above:  1. Stable maintaining sinus rhythm continue current treatment including anticoagulant 2. Stable continue current antihypertensives stable CKD managed by his nephrologist 3. Heart failure is compensated continue his current diuretic 4. He has bradycardia today with a resting heart rate of 40 to 45 bpm and Mobitz 1 second-degree AV block he is asymptomatic heart rates at home are consistently greater than 50-60 and as opposed to changing medications of difficult to control hypertension we will do a 3 day ambulatory heart rhythm monitor and make a decision to reduce or discontinue beta-blocker   Next appointment:    Medication Adjustments/Labs and Tests Ordered: Current medicines are reviewed at length with the patient today.  Concerns regarding medicines are outlined above.  Orders Placed This Encounter  Procedures  . LONG TERM MONITOR (3-14 DAYS)  . EKG 12-Lead   Meds ordered this encounter  Medications  . DISCONTD: torsemide (DEMADEX) 20 MG tablet    Sig: Take 1 tablet (20 mg total) by mouth 2 (two) times daily.    Dispense:  180 tablet    Refill:  3    Dose was changed to once daily vs twice daily  . torsemide (DEMADEX) 20 MG tablet    Sig: Take 1 tablet (20 mg total) by mouth 2 (two) times daily.    Dispense:  180 tablet    Refill:  3    Chief Complaint  Patient presents with  . Follow-up  . Congestive Heart Failure  . Hypertension    History of Present Illness:    Louis Neal is a 68 y.o. male with a hx of hypertension, heart failure, RAS  and stage 3 CKD last seen 01/04/18. Compliance with diet, lifestyle and medications: Yes  He is gained weight he attributes it to his insulin no edema orthopnea chest pain shortness of breath palpitation or syncope CAD is Stable and I reviewed his recent nephrology note Past Medical History:  Diagnosis Date  . Arthritis   . Arthropathy of lumbar facet joint 07/22/2012  . 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 Bilateral lower extremity edema 02/28/2016  . Degeneration of intervertebral disc of lumbar region 07/22/2012  . Diabetes mellitus without complication ([place])   . Diaphragmatic hernia 09/04/2016  . GERD (gastroesophageal reflux disease)   . Glaucoma 02/28/2016  . High risk medication use 01/14/2016  . Hyperlipidemia, mixed 02/28/2016  . Hypertension    dr Bettina Gavia  in [place]  . Low back pain 07/22/2012  . Lumbosacral radiculopathy 07/22/2012  . Obstructive sleep apnea 02/28/2016  . Olecranon bursitis 01/13/2017   Overview:  2018: left  . Orthostatic hypotension 01/14/2016  . Pre-ulcerative calluses 10/17/2015  . Renal artery stenosis ([place]) 02/28/2016  . Stucco keratoses 06/23/2016    Past Surgical History:  Procedure Laterality Date  . LUMBAR LAMINECTOMY/DECOMPRESSION MICRODISCECTOMY Bilateral 09/22/2012   Procedure: LUMBAR LAMINECTOMY/DECOMPRESSION MICRODISCECTOMY 1 LEVEL;  Surgeon: Ophelia Charter, MD;  Location: [facility];  Service: Neurosurgery;  Laterality: Bilateral;  Lumbar two-three laminectomy  Current Medications: Current Meds  Medication Sig  . ammonium lactate (LAC-HYDRIN) 12 % lotion Apply topically 2 (two) times daily. to affected area  . carvedilol (COREG) 25 MG tablet TAKE 1/2 TABLET BY MOUTH TWICE DAILY  . Cholecalciferol (VITAMIN D) 2000 units tablet Take 1 tablet by mouth daily.  . Colesevelam HCl 3.75 G PACK Take 1 packet by mouth daily.  . Dulaglutide (TRULICITY) 1.5 ZH/2.9JM SOPN INJECT ONCE A WEEK FOR DIABETES  . ELIQUIS 5 MG TABS tablet TAKE ONE TABLET BY  MOUTH TWICE DAILY  . esomeprazole (NEXIUM) 40 MG capsule Take 40 mg by mouth daily.  Marland Kitchen glucose blood (FORACARE PREMIUM V10 TEST) test strip CHECK BLOOD SUGAR FOUR TIMES DAILY  . HUMALOG KWIKPEN 100 UNIT/ML KiwkPen Inject 15 Units into the skin as directed. 15 units in AM 10 units at lunch and 10 units at supper  . hydrALAZINE (APRESOLINE) 100 MG tablet TAKE ONE TABLET BY MOUTH 3 TIMES DAILY  . Insulin Glargine 300 UNIT/ML SOPN Inject 70 Units into the skin daily.   Marland Kitchen latanoprost (XALATAN) 0.005 % ophthalmic solution USE ONE DROP IN AFFECTED EYE(S) EVERY DAY IN THE EVENING  . omega-3 acid ethyl esters (LOVAZA) 1 g capsule Take 2 capsules by mouth 2 (two) times daily.  Marland Kitchen PROAIR HFA 108 (90 Base) MCG/ACT inhaler Inhale 2 puffs into the lungs as needed for wheezing.  . torsemide (DEMADEX) 20 MG tablet Take 1 tablet (20 mg total) by mouth 2 (two) times daily.  . [DISCONTINUED] torsemide (DEMADEX) 20 MG tablet Take 1 tablet (20 mg total) by mouth 2 (two) times daily.  . [DISCONTINUED] torsemide (DEMADEX) 20 MG tablet Take 1 tablet (20 mg total) by mouth 2 (two) times daily.     Allergies:   Atorvastatin and Ezetimibe   Social History   Socioeconomic History  . Marital status: Married    Spouse name: Not on file  . Number of children: Not on file  . Years of education: Not on file  . Highest education level: Not on file  Occupational History  . Not on file  Social Needs  . Financial resource strain: Not on file  . Food insecurity:    Worry: Not on file    Inability: Not on file  . Transportation needs:    Medical: Not on file    Non-medical: Not on file  Tobacco Use  . Smoking status: Former Smoker    Packs/day: 2.00    Years: 5.00    Pack years: 10.00    Types: Cigarettes, Cigars  . Smokeless tobacco: Never Used  . Tobacco comment: quit 10 years ago  Substance and Sexual Activity  . Alcohol use: No  . Drug use: No  . Sexual activity: Not on file  Lifestyle  . Physical  activity:    Days per week: Not on file    Minutes per session: Not on file  . Stress: Not on file  Relationships  . Social connections:    Talks on phone: Not on file    Gets together: Not on file    Attends religious service: Not on file    Active member of club or organization: Not on file    Attends meetings of clubs or organizations: Not on file    Relationship status: Not on file  Other Topics Concern  . Not on file  Social History Narrative  . Not on file     Family History: The patient's family history includes Cancer in his  mother; Liver cancer in his father. ROS:   Please see the history of present illness.    All other systems reviewed and are negative.  EKGs/Labs/Other Studies Reviewed:    The following studies were reviewed today:  EKG:  EKG ordered today.  The ekg ordered today demonstrates sinus bradycardia Mobitz 1 second-degree AV block  Recent Labs:   07/11/2018 creatinine 1.82 potassium 4.9 cholesterol 147 LDL 86 HDL 38 No results found for requested labs within last 8760 hours.  Recent Lipid Panel No results found for: CHOL, TRIG, HDL, CHOLHDL, VLDL, LDLCALC, LDLDIRECT  Physical Exam:    VS:  BP (!) 150/70 (BP Location: Right Arm, Patient Position: Sitting, Cuff Size: Large)   Pulse 64   Ht 5\' 10"  (1.778 m)   Wt 245 lb 8 oz (111.4 kg)   SpO2 95%   BMI 35.23 kg/m     Wt Readings from Last 3 Encounters:  09/09/18 245 lb 8 oz (111.4 kg)  01/04/18 235 lb (106.6 kg)  11/30/17 233 lb 1.9 oz (105.7 kg)     GEN:  Well nourished, well developed in no acute distress HEENT: Normal NECK: No JVD; No carotid bruits LYMPHATICS: No lymphadenopathy CARDIAC: RRR, no murmurs, rubs, gallops RESPIRATORY:  Clear to auscultation without rales, wheezing or rhonchi  ABDOMEN: Soft, non-tender, non-distended MUSCULOSKELETAL:  No edema; No deformity  SKIN: Warm and dry NEUROLOGIC:  Alert and oriented x 3 PSYCHIATRIC:  Normal affect    Signed, Shirlee More, MD    09/09/2018 12:00 PM    [place]

## 2018-09-09 ENCOUNTER — Encounter: Payer: Self-pay | Admitting: Cardiology

## 2018-09-09 ENCOUNTER — Ambulatory Visit (INDEPENDENT_AMBULATORY_CARE_PROVIDER_SITE_OTHER): Payer: Medicare Other | Admitting: Cardiology

## 2018-09-09 VITALS — BP 150/70 | HR 64 | Ht 70.0 in | Wt 245.5 lb

## 2018-09-09 DIAGNOSIS — I480 Paroxysmal atrial fibrillation: Secondary | ICD-10-CM

## 2018-09-09 DIAGNOSIS — I110 Hypertensive heart disease with heart failure: Secondary | ICD-10-CM

## 2018-09-09 DIAGNOSIS — I129 Hypertensive chronic kidney disease with stage 1 through stage 4 chronic kidney disease, or unspecified chronic kidney disease: Secondary | ICD-10-CM

## 2018-09-09 MED ORDER — TORSEMIDE 20 MG PO TABS: 20.0000 mg | ORAL_TABLET | Freq: Two times a day (BID) | ORAL | 3 refills | Status: DC

## 2018-09-09 NOTE — Patient Instructions (Addendum)
Medication Instructions:  Your physician recommends that you continue on your current medications as directed. Please refer to the Current Medication list given to you today.  If you need a refill on your cardiac medications before your next appointment, please call your pharmacy.   Lab work: NONE If you have labs (blood work) drawn today and your tests are completely normal, you will receive your results only by: Marland Kitchen MyChart Message (if you have MyChart) OR . A paper copy in the mail If you have any lab test that is abnormal or we need to change your treatment, we will call you to review the results.  Testing/Procedures: You had an EKG today  Your physician has recommended that you wear a ZIO patch.  A ZIO patch is a medical device that records the heart's electrical activity. Doctors most often use these monitors to diagnose arrhythmias. Arrhythmias are problems with the speed or rhythm of the heartbeat. The monitor is a small, portable device. You can wear one while you do your normal daily activities. This is usually used to diagnose what is causing palpitations/syncope (passing out).  3 day ZIO Patch     Follow-Up: At [facility], you and your health needs are our priority.  As part of our continuing mission to provide you with exceptional heart care, we have created designated Provider Care Teams.  These Care Teams include your primary Cardiologist (physician) and Advanced Practice Providers (APPs -  Physician Assistants and Nurse Practitioners) who all work together to provide you with the care you need, when you need it. You will need a follow up appointment in 1 years.  Please call our office 2 months in advance to schedule this appointment.

## 2018-09-14 ENCOUNTER — Ambulatory Visit (INDEPENDENT_AMBULATORY_CARE_PROVIDER_SITE_OTHER): Payer: Medicare Other

## 2018-09-14 DIAGNOSIS — I480 Paroxysmal atrial fibrillation: Secondary | ICD-10-CM

## 2018-09-22 ENCOUNTER — Telehealth: Payer: Self-pay

## 2018-09-22 MED ORDER — AMIODARONE HCL 200 MG PO TABS: 200.0000 mg | ORAL_TABLET | Freq: Every day | ORAL | 1 refills | Status: DC

## 2018-09-22 NOTE — Telephone Encounter (Signed)
Patient notified of monitor showing atrial fibrillation 2 % of the time.  Patient advised to start amiodarone 200mg  one tablet daily and see Dr Bettina Gavia in 2 to 4 weeks.   Rx for amiodarone 200mg  one tablet daily sent to pharmacy.  Patient has been scheduled to see Dr Bettina Gavia in follow up on 10-14-2018.  Patient aware of plan and verbalized understanding.

## 2018-09-22 NOTE — Telephone Encounter (Signed)
-----   Message from Richardo Priest, MD sent at 09/22/2018  2:43 PM EST ----- Normal or stable result  He is having episodes of atrial fibrillation approximately 2% of the time.  I am concerned this will become persistent I would like him to start amiodarone 200 mg daily see me in the office 2 to 4 weeks later.

## 2018-10-05 ENCOUNTER — Telehealth: Payer: Self-pay

## 2018-10-05 ENCOUNTER — Other Ambulatory Visit: Payer: Self-pay | Admitting: Cardiology

## 2018-10-05 NOTE — Telephone Encounter (Signed)
Medication refill for carvedilol sent per pharmacy request.

## 2018-10-13 NOTE — Progress Notes (Signed)
Cardiology Office Note:    Date:  10/14/2018   ID:  Louis Neal, DOB 1951/04/09, MRN [id number]  PCP:  Algis Greenhouse, MD  Cardiologist:  Shirlee More, MD    Referring MD: Algis Greenhouse, MD    ASSESSMENT:    1. Paroxysmal atrial fibrillation (HCC)   2. Hypertensive heart disease with heart failure ([place])   3. On amiodarone therapy   4. CKD (chronic kidney disease) stage 3, GFR 30-59 ml/min (HCC)   5. Mobitz type 1 second degree atrioventricular block   6. Chronic anticoagulation    PLAN:    In order of problems listed above:  1. His ambulatory heart rhythm monitor showed atrial fibrillation to preserve sinus rhythm he was continued on anticoagulant put on low-dose amiodarone these tolerated without side effects I had a nice interplay with the patient and his wife about options of treatment and risk and benefits of amiodarone and then made a decision to remain on low-dose with close supervision.  With resting bradycardia heart rates less than 50 at home blood pressure target will reduce his beta-blocker given by 50% encouraged him to purchase the iPhone adapter check EKGs and if bradycardia persist want to stop a beta-blocker. 2. Blood pressure target continue current treatment except as above including loop diuretic 3. Continue low-dose amiodarone and may need to stop beta-blocker if he is bradycardic 4. Stable followed by nephrology [place] 5. Stable encourage him to purchase an iPhone adapter to screen rhythm at home 6. Continue his current anticoagulant no dose adjustment with renal failure   Next appointment: 3 months   Medication Adjustments/Labs and Tests Ordered: Current medicines are reviewed at length with the patient today.  Concerns regarding medicines are outlined above.  Orders Placed This Encounter  Procedures  . Comp Met (CMET)  . TSH+T4F+T3Free  . EKG 12-Lead   Meds ordered this encounter  Medications  . carvedilol (COREG) 25 MG tablet    Sig:  Take 0.5 tablets (12.5 mg total) by mouth daily.    Dispense:  90 tablet    Refill:  1    Chief Complaint  Patient presents with  . Hypertension  . Congestive Heart Failure    History of Present Illness:    Louis Neal is a 68 y.o. male with a hx of hypertension, heart failure, RAS and stage 3 CKD    last seen 09/09/18.  He had bradycardia in the subsequent 3-day ZIO monitor showed rare PVCs rare APCs and an episode of paroxysmal atrial fibrillation with a relatively slow response and episodes of Mobitz 1 second-degree AV block Compliance with diet, lifestyle and medications: Yes  He is pleased with the quality of his life feels well no palpitations syncope dizziness chest pain shortness of breath edema palpitation.  His wife is concerned regarding amiodarone and we had a very nice discussion presented the option of not suppressing them, I will be in atrial fibrillation may elect to continue amiodarone with close supervision for toxicity and tolerance Past Medical History:  Diagnosis Date  . Arthritis   . Arthropathy of lumbar facet joint 07/22/2012  . 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 Bilateral lower extremity edema 02/28/2016  . Degeneration of intervertebral disc of lumbar region 07/22/2012  . Diabetes mellitus without complication ([place])   . Diaphragmatic hernia 09/04/2016  . GERD (gastroesophageal reflux disease)   . Glaucoma 02/28/2016  . High risk medication use 01/14/2016  . Hyperlipidemia, mixed 02/28/2016  . Hypertension  dr Bettina Gavia  in [place]  . Low back pain 07/22/2012  . Lumbosacral radiculopathy 07/22/2012  . Obstructive sleep apnea 02/28/2016  . Olecranon bursitis 01/13/2017   Overview:  2018: left  . Orthostatic hypotension 01/14/2016  . Pre-ulcerative calluses 10/17/2015  . Renal artery stenosis ([place]) 02/28/2016  . Stucco keratoses 06/23/2016    Past Surgical History:  Procedure Laterality Date  . LUMBAR LAMINECTOMY/DECOMPRESSION MICRODISCECTOMY Bilateral  09/22/2012   Procedure: LUMBAR LAMINECTOMY/DECOMPRESSION MICRODISCECTOMY 1 LEVEL;  Surgeon: Ophelia Charter, MD;  Location: [facility];  Service: Neurosurgery;  Laterality: Bilateral;  Lumbar two-three laminectomy    Current Medications: Current Meds  Medication Sig  . ammonium lactate (LAC-HYDRIN) 12 % lotion Apply topically 2 (two) times daily. to affected area  . carvedilol (COREG) 25 MG tablet Take 0.5 tablets (12.5 mg total) by mouth daily.  . Cholecalciferol (VITAMIN D) 2000 units tablet Take 1 tablet by mouth daily.  . colesevelam (WELCHOL) 625 MG tablet Take 3 tablets (1,875 mg total) by mouth 2 times daily with meals. cholesterol  . Dulaglutide (TRULICITY) 1.5 ZH/2.9JM SOPN INJECT ONCE A WEEK FOR DIABETES  . ELIQUIS 5 MG TABS tablet TAKE ONE TABLET BY MOUTH TWICE DAILY  . esomeprazole (NEXIUM) 40 MG capsule Take 40 mg by mouth daily.  Marland Kitchen glucose blood (FORACARE PREMIUM V10 TEST) test strip CHECK BLOOD SUGAR FOUR TIMES DAILY  . HUMALOG KWIKPEN 100 UNIT/ML KiwkPen Inject 20 Units into the skin as directed. 20 units twice daily  . hydrALAZINE (APRESOLINE) 100 MG tablet TAKE ONE TABLET BY MOUTH 3 TIMES DAILY  . Insulin Glargine 300 UNIT/ML SOPN Inject 70 Units into the skin daily.   Marland Kitchen latanoprost (XALATAN) 0.005 % ophthalmic solution USE ONE DROP IN AFFECTED EYE(S) EVERY DAY IN THE EVENING  . omega-3 acid ethyl esters (LOVAZA) 1 g capsule Take 2 capsules by mouth 2 (two) times daily.  Marland Kitchen PROAIR HFA 108 (90 Base) MCG/ACT inhaler Inhale 2 puffs into the lungs as needed for wheezing.  . torsemide (DEMADEX) 20 MG tablet Take 1 tablet (20 mg total) by mouth 2 (two) times daily.  . [DISCONTINUED] carvedilol (COREG) 25 MG tablet TAKE 1/2 TABLET BY MOUTH TWICE DAILY     Allergies:   Atorvastatin and Ezetimibe   Social History   Socioeconomic History  . Marital status: Married    Spouse name: Not on file  . Number of children: Not on file  . Years of education: Not on file  . Highest  education level: Not on file  Occupational History  . Not on file  Social Needs  . Financial resource strain: Not on file  . Food insecurity:    Worry: Not on file    Inability: Not on file  . Transportation needs:    Medical: Not on file    Non-medical: Not on file  Tobacco Use  . Smoking status: Former Smoker    Packs/day: 2.00    Years: 5.00    Pack years: 10.00    Types: Cigarettes, Cigars  . Smokeless tobacco: Never Used  . Tobacco comment: quit 10 years ago  Substance and Sexual Activity  . Alcohol use: No  . Drug use: No  . Sexual activity: Not on file  Lifestyle  . Physical activity:    Days per week: Not on file    Minutes per session: Not on file  . Stress: Not on file  Relationships  . Social connections:    Talks on phone: Not on file  Gets together: Not on file    Attends religious service: Not on file    Active member of club or organization: Not on file    Attends meetings of clubs or organizations: Not on file    Relationship status: Not on file  Other Topics Concern  . Not on file  Social History Narrative  . Not on file     Family History: The patient's family history includes Cancer in his mother; Liver cancer in his father. ROS:   Please see the history of present illness.    All other systems reviewed and are negative.  EKGs/Labs/Other Studies Reviewed:    The following studies were reviewed today:  EKG:  EKG ordered today and personally reviewed.  The ekg ordered today demonstrates sinus bradycardia Mobitz 1 second-degree AV block no long pauses  Recent Labs: No results found for requested labs within last 8760 hours.  Recent Lipid Panel No results found for: CHOL, TRIG, HDL, CHOLHDL, VLDL, LDLCALC, LDLDIRECT  Physical Exam:    VS:  BP 120/60 (BP Location: Right Arm, Patient Position: Sitting, Cuff Size: Large)   Pulse (!) 57   Ht _0  (1.778 m)   Wt 251 lb 6.4 oz (114 kg)   SpO2 96%   BMI 36.07 kg/m     Wt Readings from  Last 3 Encounters:  10/14/18 251 lb 6.4 oz (114 kg)  09/09/18 245 lb 8 oz (111.4 kg)  01/04/18 235 lb (106.6 kg)     GEN:  Well nourished, well developed in no acute distress HEENT: Normal NECK: No JVD; No carotid bruits LYMPHATICS: No lymphadenopathy CARDIAC: RRR, no murmurs, rubs, gallops RESPIRATORY:  Clear to auscultation without rales, wheezing or rhonchi  ABDOMEN: Soft, non-tender, non-distended MUSCULOSKELETAL:  No edema; No deformity  SKIN: Warm and dry NEUROLOGIC:  Alert and oriented x 3 PSYCHIATRIC:  Normal affect    Signed, Shirlee More, MD  10/14/2018 12:05 PM    [place]

## 2018-10-14 ENCOUNTER — Ambulatory Visit (INDEPENDENT_AMBULATORY_CARE_PROVIDER_SITE_OTHER): Payer: Medicare Other | Admitting: Cardiology

## 2018-10-14 ENCOUNTER — Other Ambulatory Visit: Payer: Self-pay

## 2018-10-14 ENCOUNTER — Encounter: Payer: Self-pay | Admitting: Cardiology

## 2018-10-14 VITALS — BP 120/60 | HR 57 | Ht 70.0 in | Wt 251.4 lb

## 2018-10-14 DIAGNOSIS — I441 Atrioventricular block, second degree: Secondary | ICD-10-CM

## 2018-10-14 DIAGNOSIS — Z79899 Other long term (current) drug therapy: Secondary | ICD-10-CM | POA: Insufficient documentation

## 2018-10-14 DIAGNOSIS — I480 Paroxysmal atrial fibrillation: Secondary | ICD-10-CM

## 2018-10-14 DIAGNOSIS — I110 Hypertensive heart disease with heart failure: Secondary | ICD-10-CM

## 2018-10-14 DIAGNOSIS — Z7901 Long term (current) use of anticoagulants: Secondary | ICD-10-CM

## 2018-10-14 DIAGNOSIS — N183 Chronic kidney disease, stage 3 unspecified: Secondary | ICD-10-CM

## 2018-10-14 HISTORY — DX: Atrioventricular block, second degree: I44.1

## 2018-10-14 HISTORY — DX: Other long term (current) drug therapy: Z79.899

## 2018-10-14 MED ORDER — CARVEDILOL 25 MG PO TABS: 12.5000 mg | ORAL_TABLET | Freq: Every day | ORAL | 1 refills | Status: DC

## 2018-10-14 NOTE — Patient Instructions (Addendum)
Medication Instructions:  Your physician has recommended you make the following change in your medication:   DECREASE carvedilol (coreg) 25 mg: Take 0.5 tablet (12.5 mg) daily  If you need a refill on your cardiac medications before your next appointment, please call your pharmacy.   Lab work: Your physician recommends that you return for lab work today: CMP, TSH, T3, T4.   If you have labs (blood work) drawn today and your tests are completely normal, you will receive your results only by: Marland Kitchen MyChart Message (if you have MyChart) OR . A paper copy in the mail If you have any lab test that is abnormal or we need to change your treatment, we will call you to review the results.  Testing/Procedures: You had an EKG today.   Follow-Up: At [facility], you and your health needs are our priority.  As part of our continuing mission to provide you with exceptional heart care, we have created designated Provider Care Teams.  These Care Teams include your primary Cardiologist (physician) and Advanced Practice Providers (APPs -  Physician Assistants and Nurse Practitioners) who all work together to provide you with the care you need, when you need it. You will need a follow up appointment in 3 months.        KardiaMobile [email or web address]        FDA-cleared, clinical grade mobile EKG monitor: Jodelle Red is the most clinically-validated mobile EKG used by the world's leading cardiac care medical professionals With Basic service, know instantly if your heart rhythm is normal or if atrial fibrillation is detected, and email the last single EKG recording to yourself or your doctor Premium service, available for purchase through the Kardia app for $9.99 per month or $99 per year, includes unlimited history and storage of your EKG recordings, a monthly EKG summary report to share with your doctor, along with the ability to track your blood pressure, activity and weight  Includes one KardiaMobile phone clip FREE SHIPPING: Standard delivery 1-3 business days. Orders placed by 11:00am PST will ship that afternoon. Otherwise, will ship next business day. All orders ship via [employer] from [place], [place]

## 2018-10-15 LAB — COMPREHENSIVE METABOLIC PANEL
A/G RATIO: 2 (ref 1.2–2.2)
ALT: 21 IU/L (ref 0–44)
AST: 25 IU/L (ref 0–40)
Albumin: 4.7 g/dL (ref 3.8–4.8)
Alkaline Phosphatase: 65 IU/L (ref 39–117)
BUN / CREAT RATIO: 18 (ref 10–24)
BUN: 41 mg/dL — ABNORMAL HIGH (ref 8–27)
Bilirubin Total: 0.3 mg/dL (ref 0.0–1.2)
CO2: 23 mmol/L (ref 20–29)
Calcium: 8.9 mg/dL (ref 8.6–10.2)
Chloride: 101 mmol/L (ref 96–106)
Creatinine, Ser: 2.29 mg/dL — ABNORMAL HIGH (ref 0.76–1.27)
GFR calc Af Amer: 32 mL/min/{1.73_m2} — ABNORMAL LOW (ref >59)
GFR calc non Af Amer: 28 mL/min/{1.73_m2} — ABNORMAL LOW (ref >59)
GLOBULIN, TOTAL: 2.3 g/dL (ref 1.5–4.5)
GLUCOSE: 145 mg/dL — AB (ref 65–99)
POTASSIUM: 4.5 mmol/L (ref 3.5–5.2)
Sodium: 140 mmol/L (ref 134–144)
Total Protein: 7 g/dL (ref 6.0–8.5)

## 2018-10-15 LAB — TSH+T4F+T3FREE
Free T4: 1.41 ng/dL (ref 0.82–1.77)
T3, Free: 2.8 pg/mL (ref 2.0–4.4)
TSH: 3.16 u[IU]/mL (ref 0.450–4.500)

## 2018-10-17 ENCOUNTER — Telehealth: Payer: Self-pay

## 2018-10-17 DIAGNOSIS — N183 Chronic kidney disease, stage 3 unspecified: Secondary | ICD-10-CM

## 2018-10-17 DIAGNOSIS — I129 Hypertensive chronic kidney disease with stage 1 through stage 4 chronic kidney disease, or unspecified chronic kidney disease: Secondary | ICD-10-CM

## 2018-10-17 DIAGNOSIS — I110 Hypertensive heart disease with heart failure: Secondary | ICD-10-CM

## 2018-10-17 MED ORDER — TORSEMIDE 20 MG PO TABS: 20.0000 mg | ORAL_TABLET | Freq: Every day | ORAL | 3 refills | Status: DC

## 2018-10-17 NOTE — Telephone Encounter (Signed)
Patient notified that kidney functions are worse,and he should reduce torsemide 20mg   to once daily and recheck BMP in one week.  Patient agreed to plan and verbalized understanding.

## 2018-11-07 ENCOUNTER — Other Ambulatory Visit: Payer: Self-pay | Admitting: Cardiology

## 2018-11-07 NOTE — Telephone Encounter (Signed)
Rx sent to pharmacy   

## 2018-11-07 NOTE — Telephone Encounter (Signed)
°*  STAT* If patient is at the pharmacy, call can be transferred to refill team.   1. Which medications need to be refilled? (please list name of each medication and dose if known) Amiodarone HCL200MG  2. Which pharmacy/location (including street and city if local pharmacy) is medication to be sent to? [facility]  3. Do they need a 30 day or 90 day supply? 90 day

## 2018-11-14 DIAGNOSIS — I1310 Hypertensive heart and chronic kidney disease without heart failure, with stage 1 through stage 4 chronic kidney disease, or unspecified chronic kidney disease: Secondary | ICD-10-CM

## 2018-11-14 HISTORY — DX: Hypertensive heart and chronic kidney disease without heart failure, with stage 1 through stage 4 chronic kidney disease, or unspecified chronic kidney disease: I13.10

## 2019-01-13 ENCOUNTER — Telehealth: Payer: Self-pay | Admitting: Cardiology

## 2019-01-13 NOTE — Telephone Encounter (Signed)
Virtual Visit Pre-Appointment Phone Call  "(Name), I am calling you today to discuss your upcoming appointment. We are currently trying to limit exposure to the virus that causes COVID-19 by seeing patients at home rather than in the office."  1. "What is the BEST phone number to call the day of the visit?" - include this in appointment notes  2. Do you have or have access to (through a family member/friend) a smartphone with video capability that we can use for your visit?" a. If yes - list this number in appt notes as cell (if different from BEST phone #) and list the appointment type as a VIDEO visit in appointment notes b. If no - list the appointment type as a PHONE visit in appointment notes  3. Confirm consent - "In the setting of the current Covid19 crisis, you are scheduled for a (phone or video) visit with your provider on (date) at (time).  Just as we do with many in-office visits, in order for you to participate in this visit, we must obtain consent.  If you'd like, I can send this to your mychart (if signed up) or email for you to review.  Otherwise, I can obtain your verbal consent now.  All virtual visits are billed to your insurance company just like a normal visit would be.  By agreeing to a virtual visit, we'd like you to understand that the technology does not allow for your provider to perform an examination, and thus may limit your provider's ability to fully assess your condition. If your provider identifies any concerns that need to be evaluated in person, we will make arrangements to do so.  Finally, though the technology is pretty good, we cannot assure that it will always work on either your or our end, and in the setting of a video visit, we may have to convert it to a phone-only visit.  In either situation, we cannot ensure that we have a secure connection.  Are you willing to proceed?" STAFF: Did the patient verbally acknowledge consent to telehealth visit? Document  YES/NO here: Yes per pt.  4. Advise patient to be prepared - "Two hours prior to your appointment, go ahead and check your blood pressure, pulse, oxygen saturation, and your weight (if you have the equipment to check those) and write them all down. When your visit starts, your provider will ask you for this information. If you have an Apple Watch or Kardia device, please plan to have heart rate information ready on the day of your appointment. Please have a pen and paper handy nearby the day of the visit as well."  5. Give patient instructions for MyChart download to smartphone OR Doximity/Doxy.me as below if video visit (depending on what platform provider is using)  6. Inform patient they will receive a phone call 15 minutes prior to their appointment time (may be from unknown caller ID) so they should be prepared to answer    TELEPHONE CALL NOTE  Louis Neal has been deemed a candidate for a follow-up tele-health visit to limit community exposure during the Covid-19 pandemic. I spoke with the patient via phone to ensure availability of phone/video source, confirm preferred email & phone number, and discuss instructions and expectations.  I reminded Louis Neal to be prepared with any vital sign and/or heart rhythm information that could potentially be obtained via home monitoring, at the time of his visit. I reminded Louis Neal to expect a phone call  prior to his visit.  Louis Neal 01/13/2019 11:19 AM   INSTRUCTIONS FOR DOWNLOADING THE MYCHART APP TO SMARTPHONE  - The patient must first make sure to have activated MyChart and know their login information - If Apple, go to [employer] and type in MyChart in the search bar and download the app. If Android, ask patient to go to [employer] and type in [place] in the search bar and download the app. The app is free but as with any other app downloads, their phone may require them to verify saved payment information or  Apple/Android password.  - The patient will need to then log into the app with their MyChart username and password, and select [facility] as their healthcare provider to link the account. When it is time for your visit, go to the MyChart app, find appointments, and click Begin Video Visit. Be sure to Select Allow for your device to access the Microphone and Camera for your visit. You will then be connected, and your provider will be with you shortly.  **If they have any issues connecting, or need assistance please contact MyChart service desk [phone])**  **If using a computer, in order to ensure the best quality for their visit they will need to use either of the following Internet Browsers: [employer], or Google Chrome**  IF USING DOXIMITY or DOXY.ME - The patient will receive a link just prior to their visit by text.     FULL LENGTH CONSENT FOR TELE-HEALTH VISIT   I hereby voluntarily request, consent and authorize [facility] and its employed or contracted physicians, physician assistants, nurse practitioners or other licensed health care professionals (the Practitioner), to provide me with telemedicine health care services (the Services") as deemed necessary by the treating Practitioner. I acknowledge and consent to receive the Services by the Practitioner via telemedicine. I understand that the telemedicine visit will involve communicating with the Practitioner through live audiovisual communication technology and the disclosure of certain medical information by electronic transmission. I acknowledge that I have been given the opportunity to request an in-person assessment or other available alternative prior to the telemedicine visit and am voluntarily participating in the telemedicine visit.  I understand that I have the right to withhold or withdraw my consent to the use of telemedicine in the course of my care at any time, without affecting my right to future care  or treatment, and that the Practitioner or I may terminate the telemedicine visit at any time. I understand that I have the right to inspect all information obtained and/or recorded in the course of the telemedicine visit and may receive copies of available information for a reasonable fee.  I understand that some of the potential risks of receiving the Services via telemedicine include:   Delay or interruption in medical evaluation due to technological equipment failure or disruption;  Information transmitted may not be sufficient (e.g. poor resolution of images) to allow for appropriate medical decision making by the Practitioner; and/or   In rare instances, security protocols could fail, causing a breach of personal health information.  Furthermore, I acknowledge that it is my responsibility to provide information about my medical history, conditions and care that is complete and accurate to the best of my ability. I acknowledge that Practitioner's advice, recommendations, and/or decision may be based on factors not within their control, such as incomplete or inaccurate data provided by me or distortions of diagnostic images or specimens that may result from electronic  transmissions. I understand that the practice of medicine is not an exact science and that Practitioner makes no warranties or guarantees regarding treatment outcomes. I acknowledge that I will receive a copy of this consent concurrently upon execution via email to the email address I last provided but may also request a printed copy by calling the office of [facility].    I understand that my insurance will be billed for this visit.   I have read or had this consent read to me.  I understand the contents of this consent, which adequately explains the benefits and risks of the Services being provided via telemedicine.   I have been provided ample opportunity to ask questions regarding this consent and the Services and have had  my questions answered to my satisfaction.  I give my informed consent for the services to be provided through the use of telemedicine in my medical care  By participating in this telemedicine visit I agree to the above.

## 2019-01-13 NOTE — Progress Notes (Signed)
Virtual Visit via Telephone Note   This visit type was conducted due to national recommendations for restrictions regarding the COVID-19 Pandemic (e.g. social distancing) in an effort to limit this patient's exposure and mitigate transmission in our community.  Due to his co-morbid illnesses, this patient is at least at moderate risk for complications without adequate follow up.  This format is felt to be most appropriate for this patient at this time.  The patient did not have access to video technology/had technical difficulties with video requiring transitioning to audio format only (telephone).  All issues noted in this document were discussed and addressed.  No physical exam could be performed with this format.  Please refer to the patient's chart for his  consent to telehealth for [facility].   Date:  01/16/2019   ID:  Louis Neal, DOB July 20, 1951, MRN [id number]  Patient Location: Home Provider Location: Office  PCP:  Algis Greenhouse, MD  Cardiologist:  Shirlee More, MD  Electrophysiologist:  None   Evaluation Performed:  Follow-Up Visit  Chief Complaint:  68yo male presents for 3 month follow up of cardiac conditions including HTN, HF, PAF.   History of Present Illness:    Louis Neal is a 68 y.o. male with PMH HTN, heart failure, RAS, CKD3 last seen by me 10/14/18. At that visit his beta blocker dose was reduced by 50% due to sinus bradycardia Mobitz 1 second-degree AV block. He presents today for 3 month follow up. Seen by his nephrologist 12/28/18 who initiated Losartan 25mg  daily for BP control and renal protection.   Labs   12/28/18 K 4/9 Creatinine 2.33 GFR 27  11/29/18 Hb 11 RBC 3.96  10/24/18 A1C 8  10/14/18 TSH 3.16 T4 1.41 Free T3 2.8  He reports feeling generally well. Is concerned about his heart rate being low. He denies dizziness, near-syncope, weakness. Endorses feeling a little bit more tired than normal. Recent BP 151/54 with HR 43, BP 142/67 with HR 58.  Reports his systolic BP is generally 650-354. He denies chest pain. He endorses small increase in his LE edema over the last few weeks with no SOB, DOE.   The patient does not have symptoms concerning for COVID-19 infection (fever, chills, cough, or new shortness of breath).    Past Medical History:  Diagnosis Date  . Arthritis   . Arthropathy of lumbar facet joint 07/22/2012  . 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 Bilateral lower extremity edema 02/28/2016  . Degeneration of intervertebral disc of lumbar region 07/22/2012  . Diabetes mellitus without complication ([place])   . Diaphragmatic hernia 09/04/2016  . GERD (gastroesophageal reflux disease)   . Glaucoma 02/28/2016  . High risk medication use 01/14/2016  . Hyperlipidemia, mixed 02/28/2016  . Hypertension    dr Bettina Gavia  in [place]  . Low back pain 07/22/2012  . Lumbosacral radiculopathy 07/22/2012  . Obstructive sleep apnea 02/28/2016  . Olecranon bursitis 01/13/2017   Overview:  2018: left  . Orthostatic hypotension 01/14/2016  . Pre-ulcerative calluses 10/17/2015  . Renal artery stenosis ([place]) 02/28/2016  . Stucco keratoses 06/23/2016   Past Surgical History:  Procedure Laterality Date  . LUMBAR LAMINECTOMY/DECOMPRESSION MICRODISCECTOMY Bilateral 09/22/2012   Procedure: LUMBAR LAMINECTOMY/DECOMPRESSION MICRODISCECTOMY 1 LEVEL;  Surgeon: Ophelia Charter, MD;  Location: [facility];  Service: Neurosurgery;  Laterality: Bilateral;  Lumbar two-three laminectomy     No outpatient medications have been marked as taking for the 01/16/19 encounter (Appointment) with Richardo Priest, MD.  Allergies:   Atorvastatin and Ezetimibe   Social History   Tobacco Use  . Smoking status: Former Smoker    Packs/day: 2.00    Years: 5.00    Pack years: 10.00    Types: Cigarettes, Cigars  . Smokeless tobacco: Never Used  . Tobacco comment: quit 10 years ago  Substance Use Topics  . Alcohol use: No  . Drug use: No     Family Hx: The  patient's family history includes Cancer in his mother; Liver cancer in his father.  ROS:   Please see the history of present illness.    Review of Systems  Constitution: Positive for malaise/fatigue ("a little more tired"). Negative for chills and fever.  Cardiovascular: Positive for leg swelling. Negative for chest pain, dyspnea on exertion, irregular heartbeat, near-syncope and palpitations.  Respiratory: Negative for cough, shortness of breath and wheezing.   Neurological: Negative for dizziness, light-headedness and loss of balance.    All other systems reviewed and are negative.   Prior CV studies:   The following studies were reviewed today:  Echo 11/2017 Study Conclusions   - Left ventricle: The cavity size was normal. There was mild   concentric hypertrophy. Systolic function was normal. Wall motion   was normal; there were no regional wall motion abnormalities. The   study is not technically sufficient to allow evaluation of LV   diastolic function. - Mitral valve: There was moderate regurgitation. - Left atrium: The atrium was moderately dilated.   Impressions:   - Normal LVEF 60-65%   Mild LVH.   Moderate LAE.   Moderate MR.   Trace TR.   Undetermined diastolic function.  Renal artery UUS 01/2018   Right: 1-59% stenosis of the right renal artery. The RAR is < 3.5,        with lack of distal tardus arterial waveforms or difference        in size bilaterally. Note that the aortic velocity is        levated. Left:  1-59% stenosis of the left renal artery. The RAR is < 3.5,        with lack of distal tardus arterial waveforms or difference        in size bilaterally. Note that the aortic velocity is        levated. Mesenteric: Normal Celiac artery findings. Not visualised.  09/14/18 3-day Zio A ZIO monitor was performed for 3 days starting 09/14/2018.  Predominant rhythm is sinus with minimum average and maximum heart rates of 57, 71 and 92 bpm.   There are  episodes of Mobitz 1 second-degree heart block seen.  There are no episodes of pauses 3 seconds or greater or high degree sinus node or AV AV or sinus node block.  Atrial fibrillation/flutter is present with a 2% burden average rate 46 bpm rate varying from 30 to 76 bpm.   Rare isolated PVCs are present.   Rare isolated APCs are present.   There were no triggered or diary events.  Conclusion paroxysmal atrial fibrillation with a relatively slow response, Mobitz 1 second-degree AV block.   Labs/Other Tests and Data Reviewed:    EKG:  An ECG dated 10/14/18 was personally reviewed today and demonstrated:  SR 57 with 2nd degree AV block, Mobitz I  Recent Labs: 10/14/2018: ALT 21; BUN 41; Creatinine, Ser 2.29; Potassium 4.5; Sodium 140; TSH 3.160   Recent Lipid Panel No results found for: CHOL, TRIG, HDL, CHOLHDL, LDLCALC, LDLDIRECT  Wt Readings from  Last 3 Encounters:  10/14/18 251 lb 6.4 oz (114 kg)  09/09/18 245 lb 8 oz (111.4 kg)  01/04/18 235 lb (106.6 kg)     Objective:    Vital Signs:  There were no vitals taken for this visit.   VITAL SIGNS:  reviewed  ASSESSMENT & PLAN:    1. Hypertensive heart disease with heart failure - NYHA class I. Weight stable. Reports mildly increased LE edema over the last few week with no SOB/DOE. Continue current diuretic. Encourage low salt diet, LE elevation. Systolic BP 650-354 recently after addition of Losartan by nephrology.  2. Mobitz type 1 Second degree block - HR 34 on his home BP cuff today. Recent reading 43, 58. Asymptomatic aside from "a little bit tired". He has the [employer] at home, but is unsure how to send records.   Stop Coreg  EKG in 1 week  Send Kardia strips  3. PAF - Denies palpitations, heart racing.  4. Amiodarone therapy - Consider decreased dose if bradycardia does not resolve.  5. Chronic anticoagulation - Secondary to PAF. Denies bleeding complications. Continue Eliquis 5mg  BID. 6. DM2 - Recent A1c  8. Followed by PCP. Consider SGLT2 if additional therapy is needed.  COVID-19 Education: The signs and symptoms of COVID-19 were discussed with the patient and how to seek care for testing (follow up with PCP or arrange E-visit).  The importance of social distancing was discussed today.  Time:   Today, I have spent 25 minutes with the patient with telehealth technology discussing the above problems including time spent reviewing previous testing.    Medication Adjustments/Labs and Tests Ordered: Current medicines are reviewed at length with the patient today.  Concerns regarding medicines are outlined above.   Tests Ordered: No orders of the defined types were placed in this encounter.   Medication Changes: No orders of the defined types were placed in this encounter.   Follow Up:  In Person in 4 week(s)  Signed, Shirlee More, MD  01/16/2019 9:02 AM    [place]

## 2019-01-16 ENCOUNTER — Encounter: Payer: Self-pay | Admitting: Cardiology

## 2019-01-16 ENCOUNTER — Telehealth (INDEPENDENT_AMBULATORY_CARE_PROVIDER_SITE_OTHER): Payer: Medicare Other | Admitting: Cardiology

## 2019-01-16 ENCOUNTER — Other Ambulatory Visit: Payer: Self-pay

## 2019-01-16 VITALS — BP 152/75 | HR 34 | Ht 70.0 in | Wt 250.0 lb

## 2019-01-16 DIAGNOSIS — I441 Atrioventricular block, second degree: Secondary | ICD-10-CM

## 2019-01-16 DIAGNOSIS — E118 Type 2 diabetes mellitus with unspecified complications: Secondary | ICD-10-CM

## 2019-01-16 DIAGNOSIS — N183 Chronic kidney disease, stage 3 unspecified: Secondary | ICD-10-CM

## 2019-01-16 DIAGNOSIS — Z79899 Other long term (current) drug therapy: Secondary | ICD-10-CM | POA: Diagnosis not present

## 2019-01-16 DIAGNOSIS — Z7901 Long term (current) use of anticoagulants: Secondary | ICD-10-CM

## 2019-01-16 DIAGNOSIS — I480 Paroxysmal atrial fibrillation: Secondary | ICD-10-CM

## 2019-01-16 DIAGNOSIS — Z794 Long term (current) use of insulin: Secondary | ICD-10-CM

## 2019-01-16 DIAGNOSIS — I110 Hypertensive heart disease with heart failure: Secondary | ICD-10-CM

## 2019-01-16 NOTE — Patient Instructions (Addendum)
Medication Instructions:  Your physician has recommended you make the following change in your medication:  STOP: COREG (carvedilol)  If you need a refill on your cardiac medications before your next appointment, please call your pharmacy.   Lab work: None If you have labs (blood work) drawn today and your tests are completely normal, you will receive your results only by: Marland Kitchen MyChart Message (if you have MyChart) OR . A paper copy in the mail If you have any lab test that is abnormal or we need to change your treatment, we will call you to review the results.  Testing/Procedures: None  Follow-Up: At [facility], you and your health needs are our priority.  As part of our continuing mission to provide you with exceptional heart care, we have created designated Provider Care Teams.  These Care Teams include your primary Cardiologist (physician) and Advanced Practice Providers (APPs -  Physician Assistants and Nurse Practitioners) who all work together to provide you with the care you need, when you need it. You will need a follow up appointment in 1 weeks.

## 2019-01-20 NOTE — Progress Notes (Signed)
Cardiology Office Note:    Date:  01/20/2019   ID:  Louis Neal, DOB 1950-10-06, MRN [id number]  PCP:  Algis Greenhouse, MD  Cardiologist:  Shirlee More, MD    Referring MD: Algis Greenhouse, MD    ASSESSMENT:    No diagnosis found. PLAN:    In order of problems listed above:  1. Hypertensive heart disease with heart failure -BP poorly controlled switch to more potent ARB and if further changes in blood pressure medications unrelieved to nephrology 2. Mobitz type 1 second degree block -improved avoid beta-blockers and I think he needs a pacemaker 3. PAF -stable remains in sinus rhythm continue his low-dose amiodarone 4. Amiodarone therapy -stable continue low-dose amiodarone 5. Chronic anticoagulation -continue his anticoagulant 6. DM2 -stable managed by his PCP   Next appointment: 3 months   Medication Adjustments/Labs and Tests Ordered: Current medicines are reviewed at length with the patient today.  Concerns regarding medicines are outlined above.  No orders of the defined types were placed in this encounter.  No orders of the defined types were placed in this encounter.   No chief complaint on file.   History of Present Illness:    Louis Neal is a 68 y.o. male with a hx of  HTN, heart failure, RAS (on ultrasound 01/2018), CKD3, sinus bradycardia with Mobitz 1 second-degree AV block last seen by me 01/16/19. At that visit his beta blocker was stopped secondary to bradycardia. He presents today for follow up of Mobitz 1 second-degree block and bradycardia.  Echo 11/2017 EF 60-65%, mild LVH, mod LAE, mod MR, trace TR, undetermined diastolic function.   Compliance with diet, lifestyle and medications: Yes  Since his beta-blocker is stopped heart rate is stabilized in the range of the high 40s to 50 bpm but unfortunately his home systolic blood pressures running 10 to 20 mm higher in the range of 1 50-1 70.  With his Mobitz 1 second-degree AV block I would not give  him carvedilol or other potent beta-blockers for hypertension I will switch him to a more potent ARB and a further changes I will leave to the discretion of nephrology.  No chest pain palpitations syncope shortness of breath his weight is down he has less peripheral edema and overall feels improved Past Medical History:  Diagnosis Date  . Arthritis   . Arthropathy of lumbar facet joint 07/22/2012  . 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 Bilateral lower extremity edema 02/28/2016  . Degeneration of intervertebral disc of lumbar region 07/22/2012  . Diabetes mellitus without complication ([place])   . Diaphragmatic hernia 09/04/2016  . GERD (gastroesophageal reflux disease)   . Glaucoma 02/28/2016  . High risk medication use 01/14/2016  . Hyperlipidemia, mixed 02/28/2016  . Hypertension    dr Bettina Gavia  in [place]  . Low back pain 07/22/2012  . Lumbosacral radiculopathy 07/22/2012  . Obstructive sleep apnea 02/28/2016  . Olecranon bursitis 01/13/2017   Overview:  2018: left  . Orthostatic hypotension 01/14/2016  . Pre-ulcerative calluses 10/17/2015  . Renal artery stenosis ([place]) 02/28/2016  . Stucco keratoses 06/23/2016    Past Surgical History:  Procedure Laterality Date  . LUMBAR LAMINECTOMY/DECOMPRESSION MICRODISCECTOMY Bilateral 09/22/2012   Procedure: LUMBAR LAMINECTOMY/DECOMPRESSION MICRODISCECTOMY 1 LEVEL;  Surgeon: Ophelia Charter, MD;  Location: [facility];  Service: Neurosurgery;  Laterality: Bilateral;  Lumbar two-three laminectomy    Current Medications: No outpatient medications have been marked as taking for the 01/23/19 encounter (Appointment) with Richardo Priest, MD.  Allergies:   Atorvastatin and Ezetimibe   Social History   Socioeconomic History  . Marital status: Married    Spouse name: Not on file  . Number of children: Not on file  . Years of education: Not on file  . Highest education level: Not on file  Occupational History  . Not on file  Social Needs  .  Financial resource strain: Not on file  . Food insecurity    Worry: Not on file    Inability: Not on file  . Transportation needs    Medical: Not on file    Non-medical: Not on file  Tobacco Use  . Smoking status: Former Smoker    Packs/day: 2.00    Years: 5.00    Pack years: 10.00    Types: Cigarettes, Cigars  . Smokeless tobacco: Never Used  . Tobacco comment: quit 10 years ago  Substance and Sexual Activity  . Alcohol use: No  . Drug use: No  . Sexual activity: Not on file  Lifestyle  . Physical activity    Days per week: Not on file    Minutes per session: Not on file  . Stress: Not on file  Relationships  . Social Herbalist on phone: Not on file    Gets together: Not on file    Attends religious service: Not on file    Active member of club or organization: Not on file    Attends meetings of clubs or organizations: Not on file    Relationship status: Not on file  Other Topics Concern  . Not on file  Social History Narrative  . Not on file     Family History: The patient's family history includes Cancer in his mother; Liver cancer in his father. ROS:   Please see the history of present illness.    All other systems reviewed and are negative.  EKGs/Labs/Other Studies Reviewed:    The following studies were reviewed today: Echo 11/2017 Study Conclusions   - Left ventricle: The cavity size was normal. There was mild   concentric hypertrophy. Systolic function was normal. Wall motion   was normal; there were no regional wall motion abnormalities. The   study is not technically sufficient to allow evaluation of LV   diastolic function. - Mitral valve: There was moderate regurgitation. - Left atrium: The atrium was moderately dilated.   Impressions:   - Normal LVEF 60-65%   Mild LVH.   Moderate LAE.   Moderate MR.   Trace TR.   Undetermined diastolic function.   Renal artery UUS 01/2018   Right: 1-59% stenosis of the right renal artery. The  RAR is < 3.5,        with lack of distal tardus arterial waveforms or difference        in size bilaterally. Note that the aortic velocity is        levated. Left:  1-59% stenosis of the left renal artery. The RAR is < 3.5,        with lack of distal tardus arterial waveforms or difference        in size bilaterally. Note that the aortic velocity is        levated. Mesenteric: Normal Celiac artery findings. Not visualised.   09/14/18 3-day Zio A ZIO monitor was performed for 3 days starting 09/14/2018.  Predominant rhythm is sinus with minimum average and maximum heart rates of 57, 71 and 92 bpm.   There are episodes  of Mobitz 1 second-degree heart block seen.  There are no episodes of pauses 3 seconds or greater or high degree sinus node or AV AV or sinus node block.  Atrial fibrillation/flutter is present with a 2% burden average rate 46 bpm rate varying from 30 to 76 bpm.   Rare isolated PVCs are present.   Rare isolated APCs are present.   There were no triggered or diary events.   Conclusion paroxysmal atrial fibrillation with a relatively slow response, Mobitz 1 second-degree AV block.      EKG:  EKG ordered today and personally reviewed.  The ekg ordered today demonstrates sinus rhythm at 60 bpm Mobitz 1 second-degree AV block he has no prolonged pauses  Recent Labs: 10/14/2018: ALT 21; BUN 41; Creatinine, Ser 2.29; Potassium 4.5; Sodium 140; TSH 3.160  Recent Lipid Panel No results found for: CHOL, TRIG, HDL, CHOLHDL, VLDL, LDLCALC, LDLDIRECT  Physical Exam:    VS:  There were no vitals taken for this visit.    Wt Readings from Last 3 Encounters:  01/16/19 250 lb (113.4 kg)  10/14/18 251 lb 6.4 oz (114 kg)  09/09/18 245 lb 8 oz (111.4 kg)     GEN:  Well nourished, well developed in no acute distress HEENT: Normal NECK: No JVD; No carotid bruits LYMPHATICS: No lymphadenopathy CARDIAC: RRR, no murmurs, rubs, gallops RESPIRATORY:  Clear to auscultation without  rales, wheezing or rhonchi  ABDOMEN: Soft, non-tender, non-distended MUSCULOSKELETAL:  No edema; No deformity  SKIN: Warm and dry NEUROLOGIC:  Alert and oriented x 3 PSYCHIATRIC:  Normal affect    Signed, Shirlee More, MD  01/20/2019 11:15 AM    [place]

## 2019-01-23 ENCOUNTER — Other Ambulatory Visit: Payer: Self-pay

## 2019-01-23 ENCOUNTER — Ambulatory Visit: Payer: Medicare Other | Admitting: Cardiology

## 2019-01-23 ENCOUNTER — Encounter: Payer: Self-pay | Admitting: Cardiology

## 2019-01-23 VITALS — BP 160/64 | HR 47 | Temp 98.1°F | Ht 70.0 in | Wt 247.0 lb

## 2019-01-23 DIAGNOSIS — I110 Hypertensive heart disease with heart failure: Secondary | ICD-10-CM

## 2019-01-23 MED ORDER — TELMISARTAN 20 MG PO TABS: 20.0000 mg | ORAL_TABLET | Freq: Every day | ORAL | 1 refills | Status: DC

## 2019-01-23 NOTE — Patient Instructions (Signed)
Medication Instructions:  Your physician has recommended you make the following change in your medication:   STOP: Losartan  START: Telmisartan 20 mg (1 Tab) daily   If you need a refill on your cardiac medications before your next appointment, please call your pharmacy.   Lab work: None If you have labs (blood work) drawn today and your tests are completely normal, you will receive your results only by: Marland Kitchen MyChart Message (if you have MyChart) OR . A paper copy in the mail If you have any lab test that is abnormal or we need to change your treatment, we will call you to review the results.  Testing/Procedures: None  Follow-Up: At [facility], you and your health needs are our priority.  As part of our continuing mission to provide you with exceptional heart care, we have created designated Provider Care Teams.  These Care Teams include your primary Cardiologist (physician) and Advanced Practice Providers (APPs -  Physician Assistants and Nurse Practitioners) who all work together to provide you with the care you need, when you need it. You will need a follow up appointment in 6 weeks.  Any Other Special Instructions Will Be Listed Below (If Applicable).  Telmisartan tablets What is this medicine? TELMISARTAN (tel mi SAR tan) is used to treat high blood pressure. This medicine may be used for other purposes; ask your health care provider or pharmacist if you have questions. COMMON BRAND NAME(S): Micardis What should I tell my health care provider before I take this medicine? They need to know if you have any of these conditions: -if you are on a special diet, such as a low-salt diet -kidney or liver disease -an unusual or allergic reaction to telmisartan, other medicines, foods, dyes, or preservatives -pregnant or trying to get pregnant -breast-feeding How should I use this medicine? Take this medicine by mouth with a glass of water. Follow the directions on the prescription  label. This medicine can be taken with or without food. Take your doses at regular intervals. Do not take your medicine more often than directed. Talk to your pediatrician regarding the use of this medicine in children. Special care may be needed. Overdosage: If you think you have taken too much of this medicine contact a poison control center or emergency room at once. NOTE: This medicine is only for you. Do not share this medicine with others. What if I miss a dose? If you miss a dose, take it as soon as you can. If it is almost time for your next dose, take only that dose. Do not take double or extra doses. What may interact with this medicine? -digoxin -potassium salts or potassium supplements -warfarin This list may not describe all possible interactions. Give your health care provider a list of all the medicines, herbs, non-prescription drugs, or dietary supplements you use. Also tell them if you smoke, drink alcohol, or use illegal drugs. Some items may interact with your medicine. What should I watch for while using this medicine? Visit your doctor or health care professional for regular checks on your progress. Check your blood pressure as directed. Ask your doctor or health care professional what your blood pressure should be and when you should contact him or her. Call your doctor or health care professional if you notice an irregular or fast heart beat. Women should inform their doctor if they wish to become pregnant or think they might be pregnant. There is a potential for serious side effects to an unborn child,  particularly in the second or third trimester. Talk to your health care professional or pharmacist for more information. You may get drowsy or dizzy. Do not drive, use machinery, or do anything that needs mental alertness until you know how this drug affects you. Do not stand or sit up quickly, especially if you are an older patient. This reduces the risk of dizzy or fainting  spells. Alcohol can make you more drowsy and dizzy. Avoid alcoholic drinks. Avoid salt substitutes unless you are told otherwise by your doctor or health care professional. Do not treat yourself for coughs, colds, or pain while you are taking this medicine without asking your doctor or health care professional for advice. Some ingredients may increase your blood pressure. What side effects may I notice from receiving this medicine? Side effects that you should report to your doctor or health care professional as soon as possible: -allergic reactions like skin rash, itching or hives, swelling of the face, lips, or tongue -breathing problems -dark urine -gout pain -muscle pains -slow heartbeat -trouble passing urine or change in the amount of urine -unusual bleeding or bruising -yellowing of the eyes or skin Side effects that usually do not require medical attention (report to your doctor or health care professional if they continue or are bothersome): -back pain -change in sex drive or performance -diarrhea -sore throat or stuffy nose This list may not describe all possible side effects. Call your doctor for medical advice about side effects. You may report side effects to FDA at 1-800-FDA-1088. Where should I keep my medicine? Keep out of the reach of children. Store at room temperature between 15 and 30 degrees C (59 and 86 degrees F). Tablets should not be removed from the blisters until right before use. Throw away any unused medicine after the expiration date. NOTE: This sheet is a summary. It may not cover all possible information. If you have questions about this medicine, talk to your doctor, pharmacist, or health care provider.  2019 Elsevier/Gold Standard (2007-10-05 13:39:10)

## 2019-03-01 ENCOUNTER — Other Ambulatory Visit: Payer: Self-pay | Admitting: Cardiology

## 2019-03-06 ENCOUNTER — Encounter: Payer: Self-pay | Admitting: Cardiology

## 2019-03-06 ENCOUNTER — Other Ambulatory Visit: Payer: Self-pay

## 2019-03-06 ENCOUNTER — Ambulatory Visit (INDEPENDENT_AMBULATORY_CARE_PROVIDER_SITE_OTHER): Payer: Medicare Other | Admitting: Cardiology

## 2019-03-06 VITALS — BP 170/60 | HR 72 | Ht 70.0 in | Wt 247.0 lb

## 2019-03-06 DIAGNOSIS — I4892 Unspecified atrial flutter: Secondary | ICD-10-CM

## 2019-03-06 DIAGNOSIS — I441 Atrioventricular block, second degree: Secondary | ICD-10-CM | POA: Diagnosis not present

## 2019-03-06 DIAGNOSIS — I110 Hypertensive heart disease with heart failure: Secondary | ICD-10-CM

## 2019-03-06 DIAGNOSIS — Z79899 Other long term (current) drug therapy: Secondary | ICD-10-CM | POA: Diagnosis not present

## 2019-03-06 DIAGNOSIS — I480 Paroxysmal atrial fibrillation: Secondary | ICD-10-CM | POA: Diagnosis not present

## 2019-03-06 DIAGNOSIS — Z7901 Long term (current) use of anticoagulants: Secondary | ICD-10-CM

## 2019-03-06 DIAGNOSIS — N183 Chronic kidney disease, stage 3 unspecified: Secondary | ICD-10-CM

## 2019-03-06 NOTE — Progress Notes (Signed)
Cardiology Office Note:    Date:  03/06/2019   ID:  Louis Neal, DOB 1951/04/05, MRN [id number]  PCP:  Algis Greenhouse, MD  Cardiologist:  Shirlee More, MD    Referring MD: Algis Greenhouse, MD    ASSESSMENT:    1. Paroxysmal atrial fibrillation (HCC)   2. On amiodarone therapy   3. Chronic anticoagulation   4. Mobitz type 1 second degree atrioventricular block   5. Hypertensive heart disease with heart failure ([place])   6. CKD (chronic kidney disease) stage 3, GFR 30-59 ml/min (HCC)    PLAN:    In order of problems listed above:  1. Worsened today he is in atrial flutter and slow rate refer to EP for catheter ablation permanent pacemaker 2. Continue low-dose amiodarone 3. Continue his anticoagulant moderate stroke risk 4. He has sick sinus syndrome and with his sinus node dysfunction and second-degree heart block would benefit from permanent pacemaker 5. Poorly controlled once he is paced to benefit from going back on carvedilol which was very effective in achieving goal in the past 6. Slowly worsening managed by nephrology [facility]   Next appointment: He is see me back in the office after his EP consultation and procedures.   Medication Adjustments/Labs and Tests Ordered: Current medicines are reviewed at length with the patient today.  Concerns regarding medicines are outlined above.  No orders of the defined types were placed in this encounter.  No orders of the defined types were placed in this encounter.   Chief Complaint  Patient presents with  . Irregular Heart Beat    History of Present Illness:    Louis Neal is a 68 y.o. male with a hx of  HTN, heart failure, RAS (on ultrasound 01/2018), CKD3, sinus bradycardia with Mobitz 1 second-degree AV block last seen 01/23/2019. Compliance with diet, lifestyle and medications: Yes  Last week he shown blood pressures and heart rates with family physician they have typically been in the range of 40 to 50  bpm but 1 day 35 referred back to my office Louis Neal feels about the same but he notices fluttering in his chest when he is in slow atrial flutter today.  We have had difficulty controlling hypertension because of bradycardia in sinus rhythm he takes a minimum dose of amiodarone I think he require permanent pacemaker for sick sinus syndrome and I think he is best served by EP ablation for atrial flutter.  I presented the option of cardioversion.  He chooses EP consultation that will be arranged.  At this time I am not going to try to control his hypertension as he requires medications like labetalol or carvedilol that I think are contraindicated.  After pacemaker will be much easier to control hypertension.  No edema shortness of breath chest pain syncope he is compliant with his anticoagulant and no bleeding complication Past Medical History:  Diagnosis Date  . Arthritis   . Arthropathy of lumbar facet joint 07/22/2012  . 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 Bilateral lower extremity edema 02/28/2016  . Degeneration of intervertebral disc of lumbar region 07/22/2012  . Diabetes mellitus without complication ([place])   . Diaphragmatic hernia 09/04/2016  . GERD (gastroesophageal reflux disease)   . Glaucoma 02/28/2016  . High risk medication use 01/14/2016  . Hyperlipidemia, mixed 02/28/2016  . Hypertension    dr Bettina Gavia  in [place]  . Low back pain 07/22/2012  . Lumbosacral radiculopathy 07/22/2012  . Obstructive sleep apnea 02/28/2016  .  Olecranon bursitis 01/13/2017   Overview:  2018: left  . Orthostatic hypotension 01/14/2016  . Pre-ulcerative calluses 10/17/2015  . Renal artery stenosis ([place]) 02/28/2016  . Stucco keratoses 06/23/2016    Past Surgical History:  Procedure Laterality Date  . LUMBAR LAMINECTOMY/DECOMPRESSION MICRODISCECTOMY Bilateral 09/22/2012   Procedure: LUMBAR LAMINECTOMY/DECOMPRESSION MICRODISCECTOMY 1 LEVEL;  Surgeon: Ophelia Charter, MD;  Location: [facility];  Service:  Neurosurgery;  Laterality: Bilateral;  Lumbar two-three laminectomy    Current Medications: Current Meds  Medication Sig  . amiodarone (PACERONE) 200 MG tablet TAKE ONE TABLET BY MOUTH EVERY DAY  . ammonium lactate (LAC-HYDRIN) 12 % lotion Apply topically 2 (two) times daily. to affected area  . Cholecalciferol (VITAMIN D) 2000 units tablet Take 1 tablet by mouth daily.  . colesevelam (WELCHOL) 625 MG tablet Take 3 tablets (1,875 mg total) by mouth 2 times daily with meals. cholesterol  . Dulaglutide (TRULICITY) 1.5 ZO/1.0RU SOPN INJECT ONCE A WEEK FOR DIABETES  . ELIQUIS 5 MG TABS tablet TAKE ONE TABLET BY MOUTH TWICE DAILY  . esomeprazole (NEXIUM) 40 MG capsule Take 40 mg by mouth daily.  . ferrous sulfate 325 (65 FE) MG tablet Take 325 mg by mouth daily with breakfast.  . glucose blood (FORACARE PREMIUM V10 TEST) test strip CHECK BLOOD SUGAR FOUR TIMES DAILY  . HUMALOG KWIKPEN 100 UNIT/ML KiwkPen Inject 20 Units into the skin as directed. 15 units in th e AM 20 units at supper  . hydrALAZINE (APRESOLINE) 100 MG tablet TAKE ONE TABLET BY MOUTH 3 TIMES DAILY  . Insulin Glargine (TOUJEO SOLOSTAR [place]) Inject 55 Units into the skin daily.  Marland Kitchen latanoprost (XALATAN) 0.005 % ophthalmic solution USE ONE DROP IN AFFECTED EYE(S) EVERY DAY IN THE EVENING  . omega-3 acid ethyl esters (LOVAZA) 1 g capsule Take 2 capsules by mouth 2 (two) times daily.  Marland Kitchen PROAIR HFA 108 (90 Base) MCG/ACT inhaler Inhale 2 puffs into the lungs as needed for wheezing.  Marland Kitchen telmisartan (MICARDIS) 20 MG tablet Take 1 tablet (20 mg total) by mouth daily.  Marland Kitchen torsemide (DEMADEX) 20 MG tablet Take 1 tablet (20 mg total) by mouth daily.     Allergies:   Atorvastatin and Ezetimibe   Social History   Socioeconomic History  . Marital status: Married    Spouse name: Not on file  . Number of children: Not on file  . Years of education: Not on file  . Highest education level: Not on file  Occupational History  . Not on file   Social Needs  . Financial resource strain: Not on file  . Food insecurity    Worry: Not on file    Inability: Not on file  . Transportation needs    Medical: Not on file    Non-medical: Not on file  Tobacco Use  . Smoking status: Former Smoker    Packs/day: 2.00    Years: 5.00    Pack years: 10.00    Types: Cigarettes, Cigars  . Smokeless tobacco: Never Used  . Tobacco comment: quit 10 years ago  Substance and Sexual Activity  . Alcohol use: No  . Drug use: No  . Sexual activity: Not on file  Lifestyle  . Physical activity    Days per week: Not on file    Minutes per session: Not on file  . Stress: Not on file  Relationships  . Social Herbalist on phone: Not on file    Gets together: Not on  file    Attends religious service: Not on file    Active member of club or organization: Not on file    Attends meetings of clubs or organizations: Not on file    Relationship status: Not on file  Other Topics Concern  . Not on file  Social History Narrative  . Not on file     Family History: The patient's family history includes Cancer in his mother; Liver cancer in his father. ROS:   Please see the history of present illness.    All other systems reviewed and are negative.  EKGs/Labs/Other Studies Reviewed:    The following studies were reviewed today:  EKG:  EKG ordered today and personally reviewed.  The ekg ordered today demonstrates atrial flutter slow ventricular rate typical flutter  Recent Labs:  03/02/2019: Cr 2.5 K 4.4  10/14/2018: ALT 21; BUN 41; Creatinine, Ser 2.29; Potassium 4.5; Sodium 140; TSH 3.160  Recent Lipid Panel 01/26/2019 Chol 138 HDL 37 LDL 79  Physical Exam:    VS:  BP (!) 170/60 (BP Location: Left Arm, Patient Position: Sitting, Cuff Size: Normal)   Pulse 72   Ht 5\' 10"  (1.778 m)   Wt 247 lb (112 kg)   SpO2 97%   BMI 35.44 kg/m     Wt Readings from Last 3 Encounters:  03/06/19 247 lb (112 kg)  01/23/19 247 lb (112 kg)   01/16/19 250 lb (113.4 kg)     GEN:  Well nourished, well developed in no acute distress HEENT: Normal NECK: No JVD; No carotid bruits LYMPHATICS: No lymphadenopathy CARDIAC: RRR, no murmurs, rubs, gallops RESPIRATORY:  Clear to auscultation without rales, wheezing or rhonchi  ABDOMEN: Soft, non-tender, non-distended MUSCULOSKELETAL:  No edema; No deformity  SKIN: Warm and dry NEUROLOGIC:  Alert and oriented x 3 PSYCHIATRIC:  Normal affect    Signed, Shirlee More, MD  03/06/2019 8:32 AM    [facility]

## 2019-03-06 NOTE — Patient Instructions (Signed)
Medication Instructions:  Your physician recommends that you continue on your current medications as directed. Please refer to the Current Medication list given to you today.  If you need a refill on your cardiac medications before your next appointment, please call your pharmacy.   Lab work: None  If you have labs (blood work) drawn today and your tests are completely normal, you will receive your results only by: Marland Kitchen MyChart Message (if you have MyChart) OR . A paper copy in the mail If you have any lab test that is abnormal or we need to change your treatment, we will call you to review the results.  Testing/Procedures: You had an EKG today.   You have been referred to see an electrophysiologist, Dr. Curt Bears. You will be contacted to schedule your appointment.   Follow-Up: At [facility], you and your health needs are our priority.  As part of our continuing mission to provide you with exceptional heart care, we have created designated Provider Care Teams.  These Care Teams include your primary Cardiologist (physician) and Advanced Practice Providers (APPs -  Physician Assistants and Nurse Practitioners) who all work together to provide you with the care you need, when you need it. You will need a follow up appointment after your electrophysiology evaluation with Dr. Curt Bears. Call our office to schedule your appointment after you see Dr. Curt Bears.

## 2019-03-13 ENCOUNTER — Ambulatory Visit: Payer: Medicare Other | Admitting: Cardiology

## 2019-03-14 ENCOUNTER — Other Ambulatory Visit: Payer: Self-pay

## 2019-03-14 ENCOUNTER — Encounter: Payer: Self-pay | Admitting: Cardiology

## 2019-03-14 ENCOUNTER — Ambulatory Visit (INDEPENDENT_AMBULATORY_CARE_PROVIDER_SITE_OTHER): Payer: Medicare Other | Admitting: Cardiology

## 2019-03-14 VITALS — BP 164/68 | HR 49 | Ht 70.0 in | Wt 247.0 lb

## 2019-03-14 DIAGNOSIS — I483 Typical atrial flutter: Secondary | ICD-10-CM | POA: Diagnosis not present

## 2019-03-14 MED ORDER — AMLODIPINE BESYLATE 10 MG PO TABS: 10.0000 mg | ORAL_TABLET | Freq: Every day | ORAL | 3 refills | Status: DC

## 2019-03-14 NOTE — Progress Notes (Signed)
Electrophysiology Office Note   Date:  03/14/2019   ID:  Louis Neal, DOB Feb 21, 1951, MRN [id number]  PCP:  Algis Greenhouse, MD  Cardiologist: Bettina Gavia Primary Electrophysiologist:  Constance Haw, MD    No chief complaint on file.    History of Present Illness: Louis Neal is a 68 y.o. male who is being seen today for the evaluation of atrial fibrillation at the request of Bettina Gavia, Hilton Cork, MD. Presenting today for electrophysiology evaluation.  He has a history significant for diabetes, hyperlipidemia, hypertension, and atrial fibrillation.  His heart rates have been slow in atrial fibrillation in the 40s to 50s associated with weakness and fatigue.  He is currently on amiodarone for his atrial fibrillation.  He also has bradycardia in sinus rhythm and thus it would be difficult to titrate medications.  Today, he denies symptoms of palpitations, chest pain, shortness of breath, orthopnea, PND, lower extremity edema, claudication, dizziness, presyncope, syncope, bleeding, or neurologic sequela. The patient is tolerating medications without difficulties.    Past Medical History:  Diagnosis Date  . Arthritis   . Arthropathy of lumbar facet joint 07/22/2012  . 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 Bilateral lower extremity edema 02/28/2016  . Degeneration of intervertebral disc of lumbar region 07/22/2012  . Diabetes mellitus without complication ([place])   . Diaphragmatic hernia 09/04/2016  . GERD (gastroesophageal reflux disease)   . Glaucoma 02/28/2016  . High risk medication use 01/14/2016  . Hyperlipidemia, mixed 02/28/2016  . Hypertension    dr Bettina Gavia  in [place]  . Low back pain 07/22/2012  . Lumbosacral radiculopathy 07/22/2012  . Obstructive sleep apnea 02/28/2016  . Olecranon bursitis 01/13/2017   Overview:  2018: left  . Orthostatic hypotension 01/14/2016  . Pre-ulcerative calluses 10/17/2015  . Renal artery stenosis ([place]) 02/28/2016  . Stucco keratoses 06/23/2016   Past  Surgical History:  Procedure Laterality Date  . LUMBAR LAMINECTOMY/DECOMPRESSION MICRODISCECTOMY Bilateral 09/22/2012   Procedure: LUMBAR LAMINECTOMY/DECOMPRESSION MICRODISCECTOMY 1 LEVEL;  Surgeon: Ophelia Charter, MD;  Location: [facility];  Service: Neurosurgery;  Laterality: Bilateral;  Lumbar two-three laminectomy     Current Outpatient Medications  Medication Sig Dispense Refill  . amiodarone (PACERONE) 200 MG tablet TAKE ONE TABLET BY MOUTH EVERY DAY 30 tablet 3  . ammonium lactate (LAC-HYDRIN) 12 % lotion Apply topically 2 (two) times daily. to affected area  2  . Cholecalciferol (VITAMIN D) 2000 units tablet Take 1 tablet by mouth daily.    . colesevelam (WELCHOL) 625 MG tablet Take 3 tablets (1,875 mg total) by mouth 2 times daily with meals. cholesterol    . Dulaglutide (TRULICITY) 1.5 0000000 SOPN INJECT ONCE A WEEK FOR DIABETES    . ELIQUIS 5 MG TABS tablet TAKE ONE TABLET BY MOUTH TWICE DAILY 180 tablet 1  . esomeprazole (NEXIUM) 40 MG capsule Take 40 mg by mouth daily.  6  . ferrous sulfate 325 (65 FE) MG tablet Take 325 mg by mouth daily with breakfast.    . glucose blood (FORACARE PREMIUM V10 TEST) test strip CHECK BLOOD SUGAR FOUR TIMES DAILY    . HUMALOG KWIKPEN 100 UNIT/ML KiwkPen Inject 20 Units into the skin as directed. 15 units in th e AM 20 units at supper  1  . hydrALAZINE (APRESOLINE) 100 MG tablet TAKE ONE TABLET BY MOUTH 3 TIMES DAILY  6  . Insulin Glargine (TOUJEO SOLOSTAR [place]) Inject 55 Units into the skin daily.    Marland Kitchen latanoprost (XALATAN) 0.005 % ophthalmic  solution USE ONE DROP IN AFFECTED EYE(S) EVERY DAY IN THE EVENING    . omega-3 acid ethyl esters (LOVAZA) 1 g capsule Take 2 capsules by mouth 2 (two) times daily.  3  . PROAIR HFA 108 (90 Base) MCG/ACT inhaler Inhale 2 puffs into the lungs as needed for wheezing.    Marland Kitchen telmisartan (MICARDIS) 40 MG tablet Take 40 mg by mouth daily.    Marland Kitchen torsemide (DEMADEX) 20 MG tablet Take 1 tablet (20 mg total) by mouth  daily. 90 tablet 3   No current facility-administered medications for this visit.     Allergies:   Atorvastatin and Ezetimibe   Social History:  The patient  reports that he has quit smoking. His smoking use included cigarettes and cigars. He has a 10.00 pack-year smoking history. He has never used smokeless tobacco. He reports that he does not drink alcohol or use drugs.   Family History:  The patient's family history includes Cancer in his mother; Liver cancer in his father.    ROS:  Please see the history of present illness.   Otherwise, review of systems is positive for none.   All other systems are reviewed and negative.    PHYSICAL EXAM: VS:  BP (!) 164/68   Pulse (!) 49   Ht 5\' 10"  (1.778 m)   Wt 247 lb (112 kg)   BMI 35.44 kg/m  , BMI Body mass index is 35.44 kg/m. GEN: Well nourished, well developed, in no acute distress  HEENT: normal  Neck: no JVD, carotid bruits, or masses Cardiac: RRR; no murmurs, rubs, or gallops,no edema  Respiratory:  clear to auscultation bilaterally, normal work of breathing GI: soft, nontender, nondistended, + BS MS: no deformity or atrophy  Skin: warm and dry Neuro:  Strength and sensation are intact Psych: euthymic mood, full affect  EKG:  EKG is ordered today. Personal review of the ekg ordered shows atrial flutter, rate 51   Recent Labs: 10/14/2018: ALT 21; BUN 41; Creatinine, Ser 2.29; Potassium 4.5; Sodium 140; TSH 3.160    Lipid Panel  No results found for: CHOL, TRIG, HDL, CHOLHDL, VLDL, LDLCALC, LDLDIRECT   Wt Readings from Last 3 Encounters:  03/14/19 247 lb (112 kg)  03/06/19 247 lb (112 kg)  01/23/19 247 lb (112 kg)      Other studies Reviewed: Additional studies/ records that were reviewed today include: TTE 11/26/2017 Review of the above records today demonstrates:  - Left ventricle: The cavity size was normal. There was mild   concentric hypertrophy. Systolic function was normal. Wall motion   was normal; there  were no regional wall motion abnormalities. The   study is not technically sufficient to allow evaluation of LV   diastolic function. - Mitral valve: There was moderate regurgitation. - Left atrium: The atrium was moderately dilated.  Cardiac monitor 09/22/2018 personally reviewed Conclusion paroxysmal atrial fibrillation with a relatively slow response, Mobitz 1 second-degree AV block.  ASSESSMENT AND PLAN:  1.  Paroxysmal atrial fibrillation/typical atrial flutter: Currently on amiodarone and Eliquis.  I have offered him the option of ablation for his atrial fibrillation.  Risks and benefits were discussed and include bleeding, tamponade, heart block, stroke, damage surrounding organs.  He understand these risks and would like to speak with his wife about them.  If we do ablate him, we would potentially be able to get him off of amiodarone and avoid pacemaker implantation if his heart rate improves post ablation.  This patients CHA2DS2-VASc Score and  unadjusted Ischemic Stroke Rate (% per year) is equal to 3.2 % stroke rate/year from a score of 3  Above score calculated as 1 point each if present [CHF, HTN, DM, Vascular=MI/PAD/Aortic Plaque, Age if 65-74, or Male] Above score calculated as 2 points each if present [Age > 75, or Stroke/TIA/TE]  2.  Hypertension: Blood pressure is elevated today.  We  start him on Norvasc 10 mg  3.  CKD stage III: Has follow-up with nephrology  4.  Sick sinus syndrome: Discussed with the option of pacemaker implant as well as ablation.  He  discuss this with his wife.  Case discussed with primary cardiology  Current medicines are reviewed at length with the patient today.   The patient does not have concerns regarding his medicines.  The following changes were made today: Start Norvasc 10 mg  Labs/ tests ordered today include:  Orders Placed This Encounter  Procedures  . EKG 12-Lead     Disposition:   FU with   3 months   Signed,  Meredith Leeds, MD  03/14/2019 8:53 AM     [facility] [address] [place] [address] [phone] (office) [phone] (fax)

## 2019-03-14 NOTE — Patient Instructions (Signed)
Medication Instructions:  Your physician has recommended you make the following change in your medication:  1. START Norvasc (Amlodipine) 10 mg once daily  * If you need a refill on your cardiac medications before your next appointment, please call your pharmacy.   Labwork: None ordered  Testing/Procedures: None ordered  Follow-Up: To be determined.  Please call the office if/when you are ready to schedule procedure.    *Please note that any paperwork needing to be filled out by the provider will need to be addressed at the front desk prior to seeing the provider. Please note that any FMLA, disability or other documents regarding health condition is subject to a $25.00 charge that must be received prior to completion of paperwork in the form of a money order or check.  Thank you for choosing [facility]!!   Trinidad Curet, RN [phone]  Any Other Special Instructions Will Be Listed Below (If Applicable).  Cardiac Ablation Cardiac ablation is a procedure to disable (ablate) a small amount of heart tissue in very specific places. The heart has many electrical connections. Sometimes these connections are abnormal and can cause the heart to beat very fast or irregularly. Ablating some of the problem areas can improve the heart rhythm or return it to normal. Ablation may be done for people who:  Have Wolff-Parkinson-White syndrome.  Have fast heart rhythms (tachycardia).  Have taken medicines for an abnormal heart rhythm (arrhythmia) that were not effective or caused side effects.  Have a high-risk heartbeat that may be life-threatening. During the procedure, a small incision is made in the neck or the groin, and a long, thin, flexible tube (catheter) is inserted into the incision and moved to the heart. Small devices (electrodes) on the tip of the catheter will send out electrical currents. A type of X-ray (fluoroscopy) will be used to help guide the catheter and to provide  images of the heart. Tell a health care provider about:  Any allergies you have.  All medicines you are taking, including vitamins, herbs, eye drops, creams, and over-the-counter medicines.  Any problems you or family members have had with anesthetic medicines.  Any blood disorders you have.  Any surgeries you have had.  Any medical conditions you have, such as kidney failure.  Whether you are pregnant or may be pregnant. What are the risks? Generally, this is a safe procedure. However, problems may occur, including:  Infection.  Bruising and bleeding at the catheter insertion site.  Bleeding into the chest, especially into the sac that surrounds the heart. This is a serious complication.  Stroke or blood clots.  Damage to other structures or organs.  Allergic reaction to medicines or dyes.  Need for a permanent pacemaker if the normal electrical system is damaged. A pacemaker is a small computer that sends electrical signals to the heart and helps your heart beat normally.  The procedure not being fully effective. This may not be recognized until months later. Repeat ablation procedures are sometimes required. What happens before the procedure?  Follow instructions from your health care provider about eating or drinking restrictions.  Ask your health care provider about: ? Changing or stopping your regular medicines. This is especially important if you are taking diabetes medicines or blood thinners. ? Taking medicines such as aspirin and ibuprofen. These medicines can thin your blood. Do not take these medicines before your procedure if your health care provider instructs you not to.  Plan to have someone take you home from the  hospital or clinic.  If you will be going home right after the procedure, plan to have someone with you for 24 hours. What happens during the procedure?  To lower your risk of infection: ? Your health care team will wash or sanitize their  hands. ? Your skin will be washed with soap. ? Hair may be removed from the incision area.  An IV tube will be inserted into one of your veins.  You will be given a medicine to help you relax (sedative).  The skin on your neck or groin will be numbed.  An incision will be made in your neck or your groin.  A needle will be inserted through the incision and into a large vein in your neck or groin.  A catheter will be inserted into the needle and moved to your heart.  Dye may be injected through the catheter to help your surgeon see the area of the heart that needs treatment.  Electrical currents will be sent from the catheter to ablate heart tissue in desired areas. There are three types of energy that may be used to ablate heart tissue: ? Heat (radiofrequency energy). ? Laser energy. ? Extreme cold (cryoablation).  When the necessary tissue has been ablated, the catheter will be removed.  Pressure will be held on the catheter insertion area to prevent excessive bleeding.  A bandage (dressing) will be placed over the catheter insertion area. The procedure may vary among health care providers and hospitals. What happens after the procedure?  Your blood pressure, heart rate, breathing rate, and blood oxygen level will be monitored until the medicines you were given have worn off.  Your catheter insertion area will be monitored for bleeding. You will need to lie still for a few hours to ensure that you do not bleed from the catheter insertion area.  Do not drive for 24 hours or as long as directed by your health care provider. Summary  Cardiac ablation is a procedure to disable (ablate) a small amount of heart tissue in very specific places. Ablating some of the problem areas can improve the heart rhythm or return it to normal.  During the procedure, electrical currents will be sent from the catheter to ablate heart tissue in desired areas. This information is not intended to  replace advice given to you by your health care provider. Make sure you discuss any questions you have with your health care provider. Document Released: 12/06/2008 Document Revised: 01/10/2018 Document Reviewed: 06/08/2016 Elsevier Patient Education  [place].    Pacemaker Implantation, Adult Pacemaker implantation is a procedure to place a pacemaker inside your chest. A pacemaker is a small computer that sends electrical signals to the heart and helps your heart beat normally. A pacemaker also stores information about your heart rhythms. You may need pacemaker implantation if you:  Have a slow heartbeat (bradycardia).  Faint (syncope).  Have shortness of breath (dyspnea) due to heart problems. The pacemaker attaches to your heart through a wire, called a lead. Sometimes just one lead is needed. Other times, there will be two leads. There are two types of pacemakers:  Transvenous pacemaker. This type is placed under the skin or muscle of your chest. The lead goes through a vein in the chest area to reach the inside of the heart.  Epicardial pacemaker. This type is placed under the skin or muscle of your chest or belly. The lead goes through your chest to the outside of the heart. Tell a  health care provider about:  Any allergies you have.  All medicines you are taking, including vitamins, herbs, eye drops, creams, and over-the-counter medicines.  Any problems you or family members have had with anesthetic medicines.  Any blood or bone disorders you have.  Any surgeries you have had.  Any medical conditions you have.  Whether you are pregnant or may be pregnant. What are the risks? Generally, this is a safe procedure. However, problems may occur, including:  Infection.  Bleeding.  Failure of the pacemaker or the lead.  Collapse of a lung or bleeding into a lung.  Blood clot inside a blood vessel with a lead.  Damage to the heart.  Infection inside the heart  (endocarditis).  Allergic reactions to medicines. What happens before the procedure? Staying hydrated Follow instructions from your health care provider about hydration, which may include:  Up to 2 hours before the procedure - you may continue to drink clear liquids, such as water, clear fruit juice, black coffee, and plain tea. Eating and drinking restrictions Follow instructions from your health care provider about eating and drinking, which may include:  8 hours before the procedure - stop eating heavy meals or foods such as meat, fried foods, or fatty foods.  6 hours before the procedure - stop eating light meals or foods, such as toast or cereal.  6 hours before the procedure - stop drinking milk or drinks that contain milk.  2 hours before the procedure - stop drinking clear liquids. Medicines  Ask your health care provider about: ? Changing or stopping your regular medicines. This is especially important if you are taking diabetes medicines or blood thinners. ? Taking medicines such as aspirin and ibuprofen. These medicines can thin your blood. Do not take these medicines before your procedure if your health care provider instructs you not to.  You may be given antibiotic medicine to help prevent infection. General instructions  You will have a heart evaluation. This may include an electrocardiogram (ECG), chest X-ray, and heart imaging (echocardiogram,  or echo) tests.  You will have blood tests.  Do not use any products that contain nicotine or tobacco, such as cigarettes and e-cigarettes. If you need help quitting, ask your health care provider.  Plan to have someone take you home from the hospital or clinic.  If you will be going home right after the procedure, plan to have someone with you for 24 hours.  Ask your health care provider how your surgical site will be marked or identified. What happens during the procedure?  To reduce your risk of infection: ? Your  health care team will wash or sanitize their hands. ? Your skin will be washed with soap. ? Hair may be removed from the surgical area.  An IV tube will be inserted into one of your veins.  You will be given one or more of the following: ? A medicine to help you relax (sedative). ? A medicine to numb the area (local anesthetic). ? A medicine to make you fall asleep (general anesthetic).  If you are getting a transvenous pacemaker: ? An incision will be made in your upper chest. ? A pocket will be made for the pacemaker. It may be placed under the skin or between layers of muscle. ? The lead will be inserted into a blood vessel that returns to the heart. ? While X-rays are taken by an imaging machine (fluoroscopy), the lead will be advanced through the vein to the inside of  your heart. ? The other end of the lead will be tunneled under the skin and attached to the pacemaker.  If you are getting an epicardial pacemaker: ? An incision will be made near your ribs or breastbone (sternum) for the lead. ? The lead will be attached to the outside of your heart. ? Another incision will be made in your chest or upper belly to create a pocket for the pacemaker. ? The free end of the lead will be tunneled under the skin and attached to the pacemaker.  The transvenous or epicardial pacemaker will be tested. Imaging studies may be done to check the lead position.  The incisions will be closed with stitches (sutures), adhesive strips, or skin glue.  Bandages (dressing) will be placed over the incisions. The procedure may vary among health care providers and hospitals. What happens after the procedure?  Your blood pressure, heart rate, breathing rate, and blood oxygen level will be monitored until the medicines you were given have worn off.  You will be given antibiotics and pain medicine.  ECG and chest x-rays will be done.  You will wear a continuous type of ECG (Holter monitor) to check your  heart rhythm.  Your health care provider will program the pacemaker.  Do not drive for 24 hours if you received a sedative. This information is not intended to replace advice given to you by your health care provider. Make sure you discuss any questions you have with your health care provider. Document Released: 07/10/2002 Document Revised: 04/08/2018 Document Reviewed: 01/01/2016 Elsevier Patient Education  2020 [employer].

## 2019-03-23 ENCOUNTER — Ambulatory Visit: Payer: Medicare Other | Admitting: Cardiology

## 2019-04-03 NOTE — Progress Notes (Signed)
Cardiology Office Note:    Date:  04/04/2019   ID:  Louis Neal, DOB May 03, 1951, MRN [id number]  PCP:  Louis Greenhouse, MD  Cardiologist:  Louis More, MD    Referring MD: Louis Greenhouse, MD    ASSESSMENT:    1. Paroxysmal atrial fibrillation (HCC)   2. On amiodarone therapy   3. Chronic anticoagulation   4. Mobitz type 1 second degree atrioventricular block   5. CKD (chronic kidney disease) stage 3, GFR 30-59 ml/min (HCC)    PLAN:    In order of problems listed above:  1. Maintaining sinus rhythm continue low-dose amiodarone 2. Continue amiodarone next labs will require TSH liver function 3. Continue anticoagulant 4. For sick sinus syndrome will contact EP regarding pacemaker and we can resume effective and I arrhythmic and antihypertensive therapy with carvedilol 5. 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  Concerns regarding medicines are outlined above.  No orders of the defined types were placed in this encounter.  No orders of the defined types were placed in this encounter.   Chief Complaint  Patient presents with  . Other    Afib discuss EP recommendations. Meds reviewed verbally with pt.    History of Present Illness:    Louis Neal is a 68 y.o. male with a hx of paroxysmal atrial fibrillation and flutter on amiodarone, sinus bradycardia, hypertension and CKD last seen 03/04/2019. Compliance with diet, lifestyle and medications: Yes His wife participated by phone we discussed the options of ablation atrial fibrillation or pacemaker suppressant therapy after review of the benefits and risks he is decided to pursue pacemaker continue treatment with amiodarone for his atrial fibrillation and resumption of beta-blocker therapy.  I passed a note to my EP complex his QT interval is prolonged on the EKG with incomplete  right bundle branch block and is taking no QT prolonging agents other than low-dose amiodarone.  He has no ventricular arrhythmia.  He has had no angina shortness of breath orthopnea palpitation or syncope  Past Medical History:  Diagnosis Date  . Arthritis   . Arthropathy of lumbar facet joint 07/22/2012  . 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 Bilateral lower extremity edema 02/28/2016  . Degeneration of intervertebral disc of lumbar region 07/22/2012  . Diabetes mellitus without complication (Louis Neal)   . Diaphragmatic hernia 09/04/2016  . GERD (gastroesophageal reflux disease)   . Glaucoma 02/28/2016  . High risk medication use 01/14/2016  . Hyperlipidemia, mixed 02/28/2016  . Hypertension    dr Louis Neal  in [place]  . Low back pain 07/22/2012  . Lumbosacral radiculopathy 07/22/2012  . Obstructive sleep apnea 02/28/2016  . Olecranon bursitis 01/13/2017   Overview:  2018: left  . Orthostatic hypotension 01/14/2016  . Pre-ulcerative calluses 10/17/2015  . Renal artery stenosis ([place]) 02/28/2016  . Stucco keratoses 06/23/2016    Past Surgical History:  Procedure Laterality Date  . LUMBAR LAMINECTOMY/DECOMPRESSION MICRODISCECTOMY Bilateral 09/22/2012   Procedure: LUMBAR LAMINECTOMY/DECOMPRESSION MICRODISCECTOMY 1 LEVEL;  Surgeon: Louis Charter, MD;  Location: [facility] [facility];  Service: Neurosurgery;  Laterality: Bilateral;  Lumbar two-three laminectomy    Current Medications: Current Meds  Medication Sig  . amiodarone (PACERONE) 200 MG tablet TAKE ONE TABLET BY MOUTH EVERY DAY  . amLODipine (NORVASC) 10 MG tablet Take 1 tablet (10 mg total) by mouth daily.  Marland Kitchen ammonium lactate (LAC-HYDRIN) 12 %  lotion Apply topically 2 (two) times daily. to affected area  . Cholecalciferol (VITAMIN D) 2000 units tablet Take 1 tablet by mouth daily.  . colesevelam (WELCHOL) 625 MG tablet Take 3 tablets (1,875 mg total) by mouth 2 times daily with meals. cholesterol  . Dulaglutide (TRULICITY) 1.5 0000000 SOPN INJECT  ONCE A WEEK FOR DIABETES  . ELIQUIS 5 MG TABS tablet TAKE ONE TABLET BY MOUTH TWICE DAILY  . esomeprazole (NEXIUM) 40 MG capsule Take 40 mg by mouth daily.  . ferrous sulfate 325 (65 FE) MG tablet Take 325 mg by mouth daily with breakfast.  . glucose blood (FORACARE PREMIUM V10 TEST) test strip CHECK BLOOD SUGAR FOUR TIMES DAILY  . HUMALOG KWIKPEN 100 UNIT/ML KiwkPen Inject 20 Units into the skin as directed. 15 units in th e AM 20 units at supper  . hydrALAZINE (APRESOLINE) 100 MG tablet TAKE ONE TABLET BY MOUTH 3 TIMES DAILY  . Insulin Glargine (TOUJEO SOLOSTAR Wade) Inject 55 Units into the skin daily.  Marland Kitchen latanoprost (XALATAN) 0.005 % ophthalmic solution USE ONE DROP IN AFFECTED EYE(S) EVERY DAY IN THE EVENING  . omega-3 acid ethyl esters (LOVAZA) 1 g capsule Take 2 capsules by mouth 2 (two) times daily.  Marland Kitchen PROAIR HFA 108 (90 Base) MCG/ACT inhaler Inhale 2 puffs into the lungs as needed for wheezing.  Marland Kitchen telmisartan (MICARDIS) 40 MG tablet Take 40 mg by mouth daily.  Marland Kitchen torsemide (DEMADEX) 20 MG tablet Take 1 tablet (20 mg total) by mouth daily.     Allergies:   Atorvastatin and Ezetimibe   Social History   Socioeconomic History  . Marital status: Married    Spouse name: Not on file  . Number of children: Not on file  . Years of education: Not on file  . Highest education level: Not on file  Occupational History  . Not on file  Social Needs  . Financial resource strain: Not on file  . Food insecurity    Worry: Not on file    Inability: Not on file  . Transportation needs    Medical: Not on file    Non-medical: Not on file  Tobacco Use  . Smoking status: Former Smoker    Packs/day: 2.00    Years: 5.00    Pack years: 10.00    Types: Cigarettes, Cigars  . Smokeless tobacco: Never Used  . Tobacco comment: quit 10 years ago  Substance and Sexual Activity  . Alcohol use: No  . Drug use: No  . Sexual activity: Not on file  Lifestyle  . Physical activity    Days per week: Not  on file    Minutes per session: Not on file  . Stress: Not on file  Relationships  . Social Herbalist on phone: Not on file    Gets together: Not on file    Attends religious service: Not on file    Active member of club or organization: Not on file    Attends meetings of clubs or organizations: Not on file    Relationship status: Not on file  Other Topics Concern  . Not on file  Social History Narrative  . Not on file     Family History: The patient's family history includes Cancer in his mother; Liver cancer in his father. ROS:   Please see the history of present illness.    All other systems reviewed and are negative.  EKGs/Labs/Other Studies Reviewed:    The following studies were  reviewed today:  EKG:  EKG ordered today and personally reviewed.  The ekg ordered today demonstrates [place] marked first degree AVB  Recent Labs: 10/14/2018: ALT 21; BUN 41; Creatinine, Ser 2.29; Potassium 4.5; Sodium 140; TSH 3.160  03/02/2019: Creatinine 2.50 potassium 4.4 01/26/2019 Cholesterol 138 HDL 37 LDL 79   Physical Exam:    VS:  BP (!) 142/58 (BP Location: Right Arm, Patient Position: Sitting, Cuff Size: Large)   Pulse 67   Ht 5\' 10"  (1.778 m)   Wt 245 lb 8 oz (111.4 kg)   SpO2 98%   BMI 35.23 kg/m     Wt Readings from Last 3 Encounters:  04/04/19 245 lb 8 oz (111.4 kg)  03/14/19 247 lb (112 kg)  03/06/19 247 lb (112 kg)     GEN:  Well nourished, well developed in no acute distress HEENT: Normal NECK: No JVD; No carotid bruits LYMPHATICS: No lymphadenopathy CARDIAC: RRR, no murmurs, rubs, gallops RESPIRATORY:  Clear to auscultation without rales, wheezing or rhonchi  ABDOMEN: Soft, non-tender, non-distended MUSCULOSKELETAL: 1-2+ lower extremity edema since starting calcium channel blocker edema; No deformity  SKIN: Warm and dry NEUROLOGIC:  Alert and oriented x 3 PSYCHIATRIC:  Normal affect    Signed, Louis More, MD  04/04/2019 2:31 PM    [facility]

## 2019-04-04 ENCOUNTER — Other Ambulatory Visit: Payer: Self-pay

## 2019-04-04 ENCOUNTER — Ambulatory Visit (INDEPENDENT_AMBULATORY_CARE_PROVIDER_SITE_OTHER): Payer: Medicare Other | Admitting: Cardiology

## 2019-04-04 ENCOUNTER — Encounter: Payer: Self-pay | Admitting: Cardiology

## 2019-04-04 VITALS — BP 142/58 | HR 67 | Ht 70.0 in | Wt 245.5 lb

## 2019-04-04 DIAGNOSIS — Z79899 Other long term (current) drug therapy: Secondary | ICD-10-CM | POA: Diagnosis not present

## 2019-04-04 DIAGNOSIS — N183 Chronic kidney disease, stage 3 unspecified: Secondary | ICD-10-CM

## 2019-04-04 DIAGNOSIS — I441 Atrioventricular block, second degree: Secondary | ICD-10-CM

## 2019-04-04 DIAGNOSIS — Z7901 Long term (current) use of anticoagulants: Secondary | ICD-10-CM

## 2019-04-04 DIAGNOSIS — I480 Paroxysmal atrial fibrillation: Secondary | ICD-10-CM

## 2019-04-04 NOTE — Patient Instructions (Signed)
Medication Instructions:  Your physician recommends that you continue on your current medications as directed. Please refer to the Current Medication list given to you today.  If you need a refill on your cardiac medications before your next appointment, please call your pharmacy.   Lab work: None  If you have labs (blood work) drawn today and your tests are completely normal, you will receive your results only by: . MyChart Message (if you have MyChart) OR . A paper copy in the mail If you have any lab test that is abnormal or we need to change your treatment, we will call you to review the results.  Testing/Procedures: You had an EKG today.   Follow-Up: At [facility], you and your health needs are our priority.  As part of our continuing mission to provide you with exceptional heart care, we have created designated Provider Care Teams.  These Care Teams include your primary Cardiologist (physician) and Advanced Practice Providers (APPs -  Physician Assistants and Nurse Practitioners) who all work together to provide you with the care you need, when you need it. You will need a follow up appointment in 3 months.     

## 2019-04-14 ENCOUNTER — Telehealth: Payer: Self-pay | Admitting: Cardiology

## 2019-04-14 DIAGNOSIS — I480 Paroxysmal atrial fibrillation: Secondary | ICD-10-CM

## 2019-04-14 DIAGNOSIS — Z01812 Encounter for preprocedural laboratory examination: Secondary | ICD-10-CM

## 2019-04-14 MED ORDER — CARVEDILOL 6.25 MG PO TABS: 6.2500 mg | ORAL_TABLET | Freq: Two times a day (BID) | ORAL | 6 refills | Status: DC

## 2019-04-14 NOTE — Telephone Encounter (Signed)
New message    Pt c/o BP issue: STAT if pt c/o blurred vision, one-sided weakness or slurred speech  1. What are your last 5 BP readings?  152/63 pulse 60, 9-10 156/67 pulse 71, 9-9 159/70 pulse 60, 9-8 147/62 pulse 67, 9-7 146/62 pulse 63  2. Are you having any other symptoms (ex. Dizziness, headache, blurred vision, passed out)? Both ankles swollen 3. What is your BP issue?  Pt concerned about bp and ankle swelling.    Patient also want to know if Dr Bettina Gavia called Dr Curt Bears about pacemaker implant

## 2019-04-14 NOTE — Telephone Encounter (Signed)
returned pt call about BP concern. Advised to stop Norvasc, start Carvedilol 6.25 mg BID. Pt reports he was on this medication before and it was stopped by Dr. Bettina Gavia d/t bradycardia, he was taking 12/5 BID. Instructed pt to start the lower dose of Carvedilol as instructed, 6.25 mg, and call office if HR drops below 50/55. Patient verbalized understanding and agreeable to plan.   Also scheduling pt for PPM implant 9/30. Pt aware I will call next week to go over instructions but will send via MyChart today. Patient verbalized understanding and agreeable to plan.

## 2019-04-19 NOTE — Telephone Encounter (Signed)
Apologized to pt for not getting procedure instructions sent last week. Reviewed instructions with pt and sent via Mychart. Pt will stop by [place] office on 9/25 for pre procedure labs. covid screening scheduled for 9/26. Wound check scheduled for 10/8. Recall placed for 3 month post procedure follow up. Patient verbalized understanding and agreeable to plan.

## 2019-04-27 ENCOUNTER — Other Ambulatory Visit: Payer: Self-pay | Admitting: *Deleted

## 2019-04-27 DIAGNOSIS — Z01812 Encounter for preprocedural laboratory examination: Secondary | ICD-10-CM

## 2019-04-27 DIAGNOSIS — I480 Paroxysmal atrial fibrillation: Secondary | ICD-10-CM

## 2019-04-28 ENCOUNTER — Telehealth: Payer: Self-pay | Admitting: Cardiology

## 2019-04-28 NOTE — Telephone Encounter (Signed)
Pt needed some clarification on pre procedural instructions. I instructed him to ask the [facility] office for surgical scrub. If they didn't have any, he could find it at any drug store, or he could use plain dial. Pt will be able to bring a guest, but they will be asked to stay in the main waiting area. Pt will be asked to stay overnight and will need a ride home when discharged.  Pt had no additional questions.

## 2019-04-28 NOTE — Telephone Encounter (Signed)
New Message:  Patient had some questions about his upcoming pacemaker implant procedure on Wednesday 09/30. He was told to clean the implant site, and did not know what to use specifically to clean it. He also wanted to know if following the procedure he would stay overnight in the hospital. He would want to know if his Wife would be allowed to wait in the waiting room during the procedure. If he is to stay overnight, she would like to see him after the procedure.  Please call to discuss pre-procedure instructions with the patient.

## 2019-04-29 ENCOUNTER — Other Ambulatory Visit (HOSPITAL_COMMUNITY)
Admission: RE | Admit: 2019-04-29 | Discharge: 2019-04-29 | Disposition: A | Discharge status: Home / Self Care | Payer: Medicare Other | Source: Ambulatory Visit | Attending: Cardiology | Admitting: Cardiology

## 2019-04-29 DIAGNOSIS — Z20828 Contact with and (suspected) exposure to other viral communicable diseases: Secondary | ICD-10-CM | POA: Diagnosis not present

## 2019-04-29 DIAGNOSIS — R001 Bradycardia, unspecified: Secondary | ICD-10-CM | POA: Insufficient documentation

## 2019-04-29 DIAGNOSIS — Z01812 Encounter for preprocedural laboratory examination: Secondary | ICD-10-CM | POA: Insufficient documentation

## 2019-04-29 LAB — BASIC METABOLIC PANEL
BUN/Creatinine Ratio: 18 (ref 10–24)
BUN: 39 mg/dL — ABNORMAL HIGH (ref 8–27)
CO2: 23 mmol/L (ref 20–29)
Calcium: 9.4 mg/dL (ref 8.6–10.2)
Chloride: 102 mmol/L (ref 96–106)
Creatinine, Ser: 2.18 mg/dL — ABNORMAL HIGH (ref 0.76–1.27)
GFR calc Af Amer: 34 mL/min/{1.73_m2} — ABNORMAL LOW (ref >59)
GFR calc non Af Amer: 30 mL/min/{1.73_m2} — ABNORMAL LOW (ref >59)
Glucose: 135 mg/dL — ABNORMAL HIGH (ref 65–99)
Potassium: 4.6 mmol/L (ref 3.5–5.2)
Sodium: 140 mmol/L (ref 134–144)

## 2019-04-29 LAB — CBC
Hematocrit: 30.8 % — ABNORMAL LOW (ref 37.5–51.0)
Hemoglobin: 10.6 g/dL — ABNORMAL LOW (ref 13.0–17.7)
MCH: 32.2 pg (ref 26.6–33.0)
MCHC: 34.4 g/dL (ref 31.5–35.7)
MCV: 94 fL (ref 79–97)
Platelets: 246 10*3/uL (ref 150–450)
RBC: 3.29 x10E6/uL — ABNORMAL LOW (ref 4.14–5.80)
RDW: 13.4 % (ref 11.6–15.4)
WBC: 7.4 10*3/uL (ref 3.4–10.8)

## 2019-04-30 LAB — NOVEL CORONAVIRUS, NAA (HOSP ORDER, SEND-OUT TO REF LAB; TAT 18-24 HRS): SARS-CoV-2, NAA: NOT DETECTED

## 2019-05-03 ENCOUNTER — Other Ambulatory Visit: Payer: Self-pay

## 2019-05-03 ENCOUNTER — Ambulatory Visit (HOSPITAL_COMMUNITY)
Admission: RE | Admit: 2019-05-03 | Discharge: 2019-05-04 | Disposition: A | Discharge status: Home / Self Care | Payer: Medicare Other | Attending: Cardiology | Admitting: Cardiology

## 2019-05-03 ENCOUNTER — Encounter (HOSPITAL_COMMUNITY)
Admission: RE | Disposition: A | Discharge status: Home / Self Care | Payer: Self-pay | Source: Home / Self Care | Attending: Cardiology

## 2019-05-03 ENCOUNTER — Encounter (HOSPITAL_COMMUNITY): Payer: Self-pay | Admitting: Cardiology

## 2019-05-03 DIAGNOSIS — I483 Typical atrial flutter: Secondary | ICD-10-CM | POA: Insufficient documentation

## 2019-05-03 DIAGNOSIS — K219 Gastro-esophageal reflux disease without esophagitis: Secondary | ICD-10-CM | POA: Diagnosis not present

## 2019-05-03 DIAGNOSIS — E782 Mixed hyperlipidemia: Secondary | ICD-10-CM | POA: Diagnosis not present

## 2019-05-03 DIAGNOSIS — Z95818 Presence of other cardiac implants and grafts: Secondary | ICD-10-CM

## 2019-05-03 DIAGNOSIS — Z888 Allergy status to other drugs, medicaments and biological substances status: Secondary | ICD-10-CM | POA: Insufficient documentation

## 2019-05-03 DIAGNOSIS — Z87891 Personal history of nicotine dependence: Secondary | ICD-10-CM | POA: Insufficient documentation

## 2019-05-03 DIAGNOSIS — N183 Chronic kidney disease, stage 3 unspecified: Secondary | ICD-10-CM | POA: Diagnosis not present

## 2019-05-03 DIAGNOSIS — E1122 Type 2 diabetes mellitus with diabetic chronic kidney disease: Secondary | ICD-10-CM | POA: Diagnosis not present

## 2019-05-03 DIAGNOSIS — G4733 Obstructive sleep apnea (adult) (pediatric): Secondary | ICD-10-CM | POA: Diagnosis not present

## 2019-05-03 DIAGNOSIS — H409 Unspecified glaucoma: Secondary | ICD-10-CM | POA: Insufficient documentation

## 2019-05-03 DIAGNOSIS — M1990 Unspecified osteoarthritis, unspecified site: Secondary | ICD-10-CM | POA: Insufficient documentation

## 2019-05-03 DIAGNOSIS — I129 Hypertensive chronic kidney disease with stage 1 through stage 4 chronic kidney disease, or unspecified chronic kidney disease: Secondary | ICD-10-CM | POA: Insufficient documentation

## 2019-05-03 DIAGNOSIS — I480 Paroxysmal atrial fibrillation: Secondary | ICD-10-CM | POA: Insufficient documentation

## 2019-05-03 DIAGNOSIS — I495 Sick sinus syndrome: Secondary | ICD-10-CM | POA: Insufficient documentation

## 2019-05-03 HISTORY — PX: PACEMAKER IMPLANT: EP1218

## 2019-05-03 LAB — SURGICAL PCR SCREEN
MRSA, PCR: NEGATIVE
Staphylococcus aureus: POSITIVE — AB

## 2019-05-03 LAB — HEMOGLOBIN A1C
Hgb A1c MFr Bld: 5.8 % — ABNORMAL HIGH (ref 4.8–5.6)
Mean Plasma Glucose: 119.76 mg/dL

## 2019-05-03 LAB — GLUCOSE, CAPILLARY
Glucose-Capillary: 126 mg/dL — ABNORMAL HIGH (ref 70–99)
Glucose-Capillary: 103 mg/dL — ABNORMAL HIGH (ref 70–99)
Glucose-Capillary: 168 mg/dL — ABNORMAL HIGH (ref 70–99)

## 2019-05-03 SURGERY — PACEMAKER IMPLANT

## 2019-05-03 MED ORDER — HYDRALAZINE HCL 50 MG PO TABS
100.0000 mg | ORAL_TABLET | Freq: Three times a day (TID) | ORAL | Status: DC
  Administered 2019-05-03 – 2019-05-04 (×3): 100 mg via ORAL
  Filled 2019-05-03 (×3): qty 2

## 2019-05-03 MED ORDER — VITAMIN D 25 MCG (1000 UNIT) PO TABS
2000.0000 [IU] | ORAL_TABLET | Freq: Every day | ORAL | Status: DC
  Administered 2019-05-04: 2000 [IU] via ORAL
  Filled 2019-05-03: qty 2

## 2019-05-03 MED ORDER — HEPARIN (PORCINE) IN NACL 1000-0.9 UT/500ML-% IV SOLN
INTRAVENOUS | Status: DC | PRN
  Administered 2019-05-03: 500 mL

## 2019-05-03 MED ORDER — MIDAZOLAM HCL 5 MG/5ML IJ SOLN
INTRAMUSCULAR | Status: DC | PRN
  Administered 2019-05-03 (×3): 1 mg via INTRAVENOUS

## 2019-05-03 MED ORDER — AMIODARONE HCL 200 MG PO TABS
200.0000 mg | ORAL_TABLET | Freq: Every day | ORAL | Status: DC
  Administered 2019-05-04: 200 mg via ORAL
  Filled 2019-05-03: qty 1

## 2019-05-03 MED ORDER — APIXABAN 5 MG PO TABS
5.0000 mg | ORAL_TABLET | Freq: Two times a day (BID) | ORAL | Status: DC
  Administered 2019-05-03 – 2019-05-04 (×2): 5 mg via ORAL
  Filled 2019-05-03 (×2): qty 1

## 2019-05-03 MED ORDER — LIDOCAINE HCL (PF) 1 % IJ SOLN
INTRAMUSCULAR | Status: DC | PRN
  Administered 2019-05-03: 60 mL

## 2019-05-03 MED ORDER — LIDOCAINE HCL (PF) 1 % IJ SOLN
INTRAMUSCULAR | Status: AC
  Filled 2019-05-03: qty 60

## 2019-05-03 MED ORDER — SODIUM CHLORIDE 0.9 % IV SOLN
INTRAVENOUS | Status: DC
  Administered 2019-05-03: 12:00:00 via INTRAVENOUS

## 2019-05-03 MED ORDER — HEPARIN (PORCINE) IN NACL 1000-0.9 UT/500ML-% IV SOLN
INTRAVENOUS | Status: AC
  Filled 2019-05-03: qty 500

## 2019-05-03 MED ORDER — ALBUTEROL SULFATE (2.5 MG/3ML) 0.083% IN NEBU: 3.0000 mL | INHALATION_SOLUTION | Freq: Four times a day (QID) | RESPIRATORY_TRACT | Status: DC | PRN

## 2019-05-03 MED ORDER — MUPIROCIN 2 % EX OINT
TOPICAL_OINTMENT | CUTANEOUS | Status: AC
  Administered 2019-05-03: 1 via TOPICAL
  Filled 2019-05-03: qty 22

## 2019-05-03 MED ORDER — CEFAZOLIN SODIUM-DEXTROSE 1-4 GM/50ML-% IV SOLN
1.0000 g | Freq: Four times a day (QID) | INTRAVENOUS | Status: AC
  Administered 2019-05-03 – 2019-05-04 (×3): 1 g via INTRAVENOUS
  Filled 2019-05-03 (×3): qty 50

## 2019-05-03 MED ORDER — SODIUM CHLORIDE 0.9 % IV SOLN
INTRAVENOUS | Status: AC
  Filled 2019-05-03: qty 2

## 2019-05-03 MED ORDER — PANTOPRAZOLE SODIUM 40 MG PO TBEC: 80.0000 mg | DELAYED_RELEASE_TABLET | Freq: Every day | ORAL | Status: DC

## 2019-05-03 MED ORDER — INSULIN LISPRO (1 UNIT DIAL) 100 UNIT/ML (KWIKPEN): 15.0000 [IU] | PEN_INJECTOR | Freq: Two times a day (BID) | SUBCUTANEOUS | Status: DC

## 2019-05-03 MED ORDER — OFF THE BEAT BOOK
Freq: Once | Status: AC
  Administered 2019-05-04: 02:00:00
  Filled 2019-05-03: qty 1

## 2019-05-03 MED ORDER — INSULIN ASPART 100 UNIT/ML ~~LOC~~ SOLN
0.0000 [IU] | Freq: Three times a day (TID) | SUBCUTANEOUS | Status: DC
  Administered 2019-05-04: 09:00:00 2 [IU] via SUBCUTANEOUS

## 2019-05-03 MED ORDER — ONDANSETRON HCL 4 MG/2ML IJ SOLN: 4.0000 mg | Freq: Four times a day (QID) | INTRAMUSCULAR | Status: DC | PRN

## 2019-05-03 MED ORDER — INSULIN ASPART 100 UNIT/ML ~~LOC~~ SOLN
15.0000 [IU] | Freq: Two times a day (BID) | SUBCUTANEOUS | Status: DC
  Administered 2019-05-04: 09:00:00 15 [IU] via SUBCUTANEOUS

## 2019-05-03 MED ORDER — MUPIROCIN 2 % EX OINT
1.0000 "application " | TOPICAL_OINTMENT | Freq: Once | CUTANEOUS | Status: AC
  Administered 2019-05-03: 12:00:00 1 via TOPICAL
  Filled 2019-05-03: qty 22

## 2019-05-03 MED ORDER — INSULIN GLARGINE 100 UNIT/ML ~~LOC~~ SOLN
55.0000 [IU] | Freq: Every day | SUBCUTANEOUS | Status: DC
  Administered 2019-05-04: 09:00:00 55 [IU] via SUBCUTANEOUS
  Filled 2019-05-03 (×2): qty 0.55

## 2019-05-03 MED ORDER — LATANOPROST 0.005 % OP SOLN
1.0000 [drp] | Freq: Every day | OPHTHALMIC | Status: DC
  Administered 2019-05-03: 22:00:00 1 [drp] via OPHTHALMIC
  Filled 2019-05-03 (×2): qty 2.5

## 2019-05-03 MED ORDER — MIDAZOLAM HCL 5 MG/5ML IJ SOLN
INTRAMUSCULAR | Status: AC
  Filled 2019-05-03: qty 5

## 2019-05-03 MED ORDER — OMEGA-3-ACID ETHYL ESTERS 1 G PO CAPS
2.0000 | ORAL_CAPSULE | Freq: Two times a day (BID) | ORAL | Status: DC
  Administered 2019-05-03 – 2019-05-04 (×2): 2 g via ORAL
  Filled 2019-05-03 (×2): qty 2

## 2019-05-03 MED ORDER — CEFAZOLIN SODIUM-DEXTROSE 2-4 GM/100ML-% IV SOLN
2.0000 g | INTRAVENOUS | Status: AC
  Administered 2019-05-03: 2 g via INTRAVENOUS
  Filled 2019-05-03: qty 100

## 2019-05-03 MED ORDER — FERROUS SULFATE 325 (65 FE) MG PO TABS
325.0000 mg | ORAL_TABLET | Freq: Every day | ORAL | Status: DC
  Administered 2019-05-04: 325 mg via ORAL
  Filled 2019-05-03: qty 1

## 2019-05-03 MED ORDER — CHLORHEXIDINE GLUCONATE 4 % EX LIQD
60.0000 mL | Freq: Once | CUTANEOUS | Status: DC
  Filled 2019-05-03: qty 60

## 2019-05-03 MED ORDER — IRBESARTAN 150 MG PO TABS
150.0000 mg | ORAL_TABLET | Freq: Every day | ORAL | Status: DC
  Administered 2019-05-04: 150 mg via ORAL
  Filled 2019-05-03: qty 1

## 2019-05-03 MED ORDER — SODIUM CHLORIDE 0.9 % IV SOLN
80.0000 mg | INTRAVENOUS | Status: AC
  Administered 2019-05-03: 16:00:00 80 mg

## 2019-05-03 MED ORDER — YOU HAVE A PACEMAKER BOOK
Freq: Once | Status: AC
  Administered 2019-05-04: 02:00:00
  Filled 2019-05-03: qty 1

## 2019-05-03 MED ORDER — FENTANYL CITRATE (PF) 100 MCG/2ML IJ SOLN
INTRAMUSCULAR | Status: DC | PRN
  Administered 2019-05-03 (×3): 25 ug via INTRAVENOUS

## 2019-05-03 MED ORDER — FENTANYL CITRATE (PF) 100 MCG/2ML IJ SOLN
INTRAMUSCULAR | Status: AC
  Filled 2019-05-03: qty 2

## 2019-05-03 MED ORDER — CARVEDILOL 6.25 MG PO TABS
6.2500 mg | ORAL_TABLET | Freq: Two times a day (BID) | ORAL | Status: DC
  Administered 2019-05-03 – 2019-05-04 (×2): 6.25 mg via ORAL
  Filled 2019-05-03 (×2): qty 1

## 2019-05-03 MED ORDER — COLESEVELAM HCL 625 MG PO TABS
1875.0000 mg | ORAL_TABLET | Freq: Two times a day (BID) | ORAL | Status: DC
  Administered 2019-05-03 – 2019-05-04 (×2): 1875 mg via ORAL
  Filled 2019-05-03 (×2): qty 3

## 2019-05-03 MED ORDER — DULAGLUTIDE 1.5 MG/0.5ML ~~LOC~~ SOAJ: 1.5000 mg | SUBCUTANEOUS | Status: DC

## 2019-05-03 MED ORDER — ACETAMINOPHEN 325 MG PO TABS
325.0000 mg | ORAL_TABLET | ORAL | Status: DC | PRN
  Administered 2019-05-04: 650 mg via ORAL
  Filled 2019-05-03: qty 2

## 2019-05-03 SURGICAL SUPPLY — 7 items
CABLE SURGICAL S-101-97-12 (CABLE) ×3 IMPLANT
LEAD TENDRIL MRI 52CM LPA1200M (Lead) ×3 IMPLANT
LEAD TENDRIL MRI 58CM LPA1200M (Lead) ×3 IMPLANT
PACEMAKER ASSURITY DR-RF (Pacemaker) ×3 IMPLANT
PAD PRO RADIOLUCENT 2001M-C (PAD) ×3 IMPLANT
SHEATH 8FR PRELUDE SNAP 13 (SHEATH) ×6 IMPLANT
TRAY PACEMAKER INSERTION (PACKS) ×3 IMPLANT

## 2019-05-03 NOTE — H&P (Signed)
Electrophysiology Office Note   Date:  05/03/2019   ID:  Alva, Mixell 1950-10-07, MRN [id number]  PCP:  Algis Greenhouse, MD  Cardiologist: Bettina Gavia Primary Electrophysiologist:  Lateasha Breuer Meredith Leeds, MD    No chief complaint on file.    History of Present Illness: Louis Neal is a 68 y.o. male who is being seen today for the evaluation of atrial fibrillation at the request of No ref. provider found. Presenting today for electrophysiology evaluation.  He has a history significant for diabetes, hyperlipidemia, hypertension, and atrial fibrillation.  His heart rates have been slow in atrial fibrillation in the 40s to 50s associated with weakness and fatigue.  He is currently on amiodarone for his atrial fibrillation.  He also has bradycardia in sinus rhythm and thus it would be difficult to titrate medications.  Today, he denies symptoms of palpitations, chest pain, shortness of breath, orthopnea, PND, lower extremity edema, claudication, dizziness, presyncope, syncope, bleeding, or neurologic sequela. The patient is tolerating medications without difficulties.    Past Medical History:  Diagnosis Date  . Arthritis   . Arthropathy of lumbar facet joint 07/22/2012  . 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 Bilateral lower extremity edema 02/28/2016  . Degeneration of intervertebral disc of lumbar region 07/22/2012  . Diabetes mellitus without complication ([place])   . Diaphragmatic hernia 09/04/2016  . GERD (gastroesophageal reflux disease)   . Glaucoma 02/28/2016  . High risk medication use 01/14/2016  . Hyperlipidemia, mixed 02/28/2016  . Hypertension    dr Bettina Gavia  in [place]  . Low back pain 07/22/2012  . Lumbosacral radiculopathy 07/22/2012  . Obstructive sleep apnea 02/28/2016  . Olecranon bursitis 01/13/2017   Overview:  2018: left  . Orthostatic hypotension 01/14/2016  . Pre-ulcerative calluses 10/17/2015  . Renal artery stenosis ([place]) 02/28/2016  . Stucco keratoses 06/23/2016    Past Surgical History:  Procedure Laterality Date  . LUMBAR LAMINECTOMY/DECOMPRESSION MICRODISCECTOMY Bilateral 09/22/2012   Procedure: LUMBAR LAMINECTOMY/DECOMPRESSION MICRODISCECTOMY 1 LEVEL;  Surgeon: Ophelia Charter, MD;  Location: [place] [facility];  Service: Neurosurgery;  Laterality: Bilateral;  Lumbar two-three laminectomy     Current Facility-Administered Medications  Medication Dose Route Frequency Provider Last Rate Last Dose  . 0.9 %  sodium chloride infusion   Intravenous Continuous Constance Haw, MD 50 mL/hr at 05/03/19 1229    . ceFAZolin (ANCEF) IVPB 2g/100 mL premix  2 g Intravenous On Call Neyah Ellerman Hassell Done, MD      . chlorhexidine (HIBICLENS) 4 % liquid 4 application  60 mL Topical Once Phelicia Dantes Hassell Done, MD      . gentamicin (GARAMYCIN) 80 mg in sodium chloride 0.9 % 500 mL irrigation  80 mg Irrigation On Call Kwasi Joung, Ocie Doyne, MD        Allergies:   Atorvastatin, Ezetimibe, and Norvasc [amlodipine besylate]   Social History:  The patient  reports that he has quit smoking. His smoking use included cigarettes and cigars. He has a 10.00 pack-year smoking history. He has never used smokeless tobacco. He reports that he does not drink alcohol or use drugs.   Family History:  The patient's family history includes Cancer in his mother; Liver cancer in his father.    ROS:  Please see the history of present illness.   Otherwise, review of systems is positive for none.   All other systems are reviewed and negative.    PHYSICAL EXAM: VS:  BP (!) 166/61   Pulse (!) 57   Temp 98.1  F (36.7 C) (Skin)   Resp 16   Ht 5\' 10"  (1.778 m)   Wt 108.9 kg   SpO2 99%   BMI 34.44 kg/m  , BMI Body mass index is 34.44 kg/m. GEN: Well nourished, well developed, in no acute distress  HEENT: normal  Neck: no JVD, carotid bruits, or masses Cardiac: RRR; no murmurs, rubs, or gallops,no edema  Respiratory:  clear to auscultation bilaterally, normal work of breathing GI:  soft, nontender, nondistended, + BS MS: no deformity or atrophy  Skin: warm and dry Neuro:  Strength and sensation are intact Psych: euthymic mood, full affect  EKG:  EKG is ordered today. Personal review of the ekg ordered shows atrial flutter, rate 51   Recent Labs: 10/14/2018: ALT 21; TSH 3.160 04/28/2019: BUN 39; Creatinine, Ser 2.18; Hemoglobin 10.6; Platelets 246; Potassium 4.6; Sodium 140    Lipid Panel  No results found for: CHOL, TRIG, HDL, CHOLHDL, VLDL, LDLCALC, LDLDIRECT   Wt Readings from Last 3 Encounters:  05/03/19 108.9 kg  04/04/19 111.4 kg  03/14/19 112 kg      Other studies Reviewed: Additional studies/ records that were reviewed today include: TTE 11/26/2017 Review of the above records today demonstrates:  - Left ventricle: The cavity size was normal. There was mild   concentric hypertrophy. Systolic function was normal. Wall motion   was normal; there were no regional wall motion abnormalities. The   study is not technically sufficient to allow evaluation of LV   diastolic function. - Mitral valve: There was moderate regurgitation. - Left atrium: The atrium was moderately dilated.  Cardiac monitor 09/22/2018 personally reviewed Conclusion paroxysmal atrial fibrillation with a relatively slow response, Mobitz 1 second-degree AV block.  ASSESSMENT AND PLAN:  1.  Paroxysmal atrial fibrillation/typical atrial flutter: Currently on amiodarone and Eliquis.  I have offered him the option of ablation for his atrial fibrillation.  Risks and benefits were discussed and include bleeding, tamponade, heart block, stroke, damage surrounding organs.  He understand these risks and would like to speak with his wife about them.  If we do ablate him, we would potentially be able to get him off of amiodarone and avoid pacemaker implantation if his heart rate improves post ablation.  This patients CHA2DS2-VASc Score and unadjusted Ischemic Stroke Rate (% per year) is equal to  3.2 % stroke rate/year from a score of 3  Above score calculated as 1 point each if present [CHF, HTN, DM, Vascular=MI/PAD/Aortic Plaque, Age if 65-74, or Male] Above score calculated as 2 points each if present [Age > 75, or Stroke/TIA/TE]  2.  Hypertension: Blood pressure is elevated today.  We Perkins Molina start him on Norvasc 10 mg  3.  CKD stage III: Has follow-up with nephrology  4.  Sick sinus syndrome: Louis Neal has presented today for surgery, with the diagnosis of sick sinus syndrome.  The various methods of treatment have been discussed with the patient and family. After consideration of risks, benefits and other options for treatment, the patient has consented to  Procedure(s): Pacemaker implant as a surgical intervention .  Risks include but not limited to bleeding, tamponade, infection, pneumothorax, among others. The patient's history has been reviewed, patient examined, no change in status, stable for surgery.  I have reviewed the patient's chart and labs.  Questions were answered to the patient's satisfaction.    Tanyah Debruyne Curt Bears, MD 05/03/2019 2:49 PM  .

## 2019-05-03 NOTE — Discharge Summary (Addendum)
ELECTROPHYSIOLOGY PROCEDURE DISCHARGE SUMMARY    Patient ID: Louis Neal,  MRN: [id number], DOB/AGE: 09-05-1950 68 y.o.  Admit date: 05/03/2019 Discharge date: 05/04/2019  Primary Care Physician: Algis Greenhouse, MD Primary Cardiologist: Dr. Bettina Gavia Electrophysiologist: Dr. Curt Bears  Primary Discharge Diagnosis:  1. Sick sinus syndrome  Secondary Discharge Diagnosis:  1. DM 2. HLD 3. HTN 4. Paroxysmal afib/atypical aflutter     CHA2DS2Vasc is 3, on Eliquis,  appropriately dosed for age/weight 5. CKD (III)   Allergies  Allergen Reactions  . Atorvastatin Other (See Comments)    Muscle weakness  . Ezetimibe      Muscle weakness  . Norvasc [Amlodipine Besylate] Swelling    Ankle swelling     Procedures This Admission:  1.  Implantation of a SJM PPM chamber PPM on 05/03/2019 by Dr Curt Bears.  The patient received a Comptroller Assurity MRI  model [id number] (serial number  [id number] ) pacemaker, [employer] model Tendril MRI [id number] (serial number  [id number]) right atrial lead and a [employer] Jude [employer] model Tendril MRI [id number] (serial number  [id number]) right ventricular lead  There were no immediate post procedure complications. 2.  CXR on 05/04/2019 demonstrated no pneumothorax status post device implantation.   Brief HPI: Louis Neal is a 68 y.o. male was referred to electrophysiology in the outpatient setting for consideration of PPM implantation.  Past medical history includes above.  The patient found with sick sinus syndrome.  Risks, benefits, and alternatives to PPM implantation were reviewed with the patient who wished to proceed.   Hospital Course:  The patient was admitted and underwent implantation of a PPM with details as outlined above.  He was monitored on telemetry overnight which demonstrated AV paced.  Left chest was without hematoma or ecchymosis.  The device was interrogated and found to be functioning normally.  CXR was obtained and demonstrated no  pneumothorax status post device implantation.  Wound care, arm mobility, and restrictions were reviewed with the patient.  The patient feels well this morning, no CP or SOB, he was examined by Dr. Curt Bears and considered stable for discharge to home.    Physical Exam: Vitals:   05/03/19 2000 05/03/19 2031 05/04/19 0536 05/04/19 0919  BP:  (!) 135/54 (!) 150/66 (!) 147/62  Pulse: 60 60 62   Resp:  20 18   Temp:  97.8 F (36.6 C) 97.7 F (36.5 C)   TempSrc:  Oral Oral   SpO2: 96% 96% 97%   Weight:   107.4 kg   Height:        GEN- The patient is well appearing, alert and oriented x 3 today.   HEENT: normocephalic, atraumatic; sclera clear, conjunctiva pink; hearing intact; oropharynx clear; neck supple, no JVP Lungs- CTA b/l, normal work of breathing.  No wheezes, rales, rhonchi Heart- RRR, no murmurs, rubs or gallops, PMI not laterally displaced GI- soft, non-tender, non-distended Extremities- no clubbing, cyanosis, or edema MS- no significant deformity or atrophy Skin- warm and dry, no rash or lesion, left chest without hematoma/ecchymosis Psych- euthymic mood, full affect Neuro- no gross deficits   Labs:   Lab Results  Component Value Date   WBC 7.4 04/28/2019   HGB 10.6 (L) 04/28/2019   HCT 30.8 (L) 04/28/2019   MCV 94 04/28/2019   PLT 246 04/28/2019    Recent Labs  Lab 04/28/19 0916  NA 140  K 4.6  CL 102  CO2 23  BUN 39*  CREATININE 2.18*  CALCIUM 9.4  GLUCOSE 135*    Discharge Medications:  Allergies as of 05/04/2019      Reactions   Atorvastatin Other (See Comments)   Muscle weakness   Ezetimibe    Muscle weakness   Norvasc [amlodipine Besylate] Swelling   Ankle swelling      Medication List    TAKE these medications   amiodarone 200 MG tablet Commonly known as: PACERONE TAKE ONE TABLET BY MOUTH EVERY DAY   ammonium lactate 12 % lotion Commonly known as: LAC-HYDRIN Apply 1 application topically 2 (two) times daily. to affected area    carvedilol 6.25 MG tablet Commonly known as: COREG Take 1 tablet (6.25 mg total) by mouth 2 (two) times daily.   colesevelam 625 MG tablet Commonly known as: WELCHOL Take 1,875 mg by mouth 2 (two) times daily with a meal.   Eliquis 5 MG Tabs tablet Generic drug: apixaban TAKE ONE TABLET BY MOUTH TWICE DAILY What changed: how much to take   esomeprazole 40 MG capsule Commonly known as: NEXIUM Take 40 mg by mouth daily.   ferrous sulfate 325 (65 FE) MG tablet Take 325 mg by mouth daily with breakfast.   ForaCare premium V10 Test test strip Generic drug: glucose blood CHECK BLOOD SUGAR FOUR TIMES DAILY   HumaLOG KwikPen 100 UNIT/ML KwikPen Generic drug: insulin lispro Inject 15-20 Units into the skin 2 (two) times daily with a meal. Inject 15 units in the morning and 20 units in the evening   hydrALAZINE 100 MG tablet Commonly known as: APRESOLINE Take 100 mg by mouth 3 (three) times daily.   latanoprost 0.005 % ophthalmic solution Commonly known as: XALATAN Place 1 drop into both eyes at bedtime.   omega-3 acid ethyl esters 1 g capsule Commonly known as: LOVAZA Take 2 capsules by mouth 2 (two) times daily.   ProAir HFA 108 (90 Base) MCG/ACT inhaler Generic drug: albuterol Inhale 2 puffs into the lungs every 6 (six) hours as needed for wheezing.   telmisartan 40 MG tablet Commonly known as: MICARDIS Take 40 mg by mouth every evening.   torsemide 20 MG tablet Commonly known as: DEMADEX Take 1 tablet (20 mg total) by mouth daily. What changed:   how much to take  when to take this  additional instructions   Toujeo SoloStar 300 UNIT/ML Sopn Generic drug: Insulin Glargine (1 Unit Dial) Inject 55 Units as directed daily.   Trulicity 1.5 0000000 Sopn Generic drug: Dulaglutide Inject 1.5 mg as directed every Thursday.   Vitamin D 50 MCG (2000 UT) tablet Take 2,000 Units by mouth daily.       Disposition:  Home  Discharge Instructions    Diet - low  sodium heart healthy   Complete by: As directed    Increase activity slowly   Complete by: As directed      Follow-up Information    [facility] Follow up.   Specialty: Cardiology Why: 05/11/2019 @ 9:20AM, wound check visit Contact information: [address] [place] [phone]       Constance Haw, MD Follow up.   Specialty: Cardiology Why: you Hailie Searight be called by Dr. Macky Lower scheduler to make a 3 month follow up visit Contact information: [place] [address] [phone]           Duration of Discharge Encounter: Greater than 30 minutes including physician time.  Venetia Night, PA-C 05/04/2019 9:23 AM  I have seen and examined this patient with Tommye Standard.  Agree with above, note added to reflect my findings.  On exam, RRR, no murmurs, lungs clear.  She is now status post Saint Jude dual-chamber pacemaker for sick sinus syndrome and intermittent 2-1 AV block.  Device functioning appropriately.  Chest x-ray and interrogation without issue.  Plan for discharge today with follow-up in device clinic.  Ogechi Kuehnel M. Brooke Payes MD 05/04/2019 9:24 AM

## 2019-05-04 ENCOUNTER — Ambulatory Visit (HOSPITAL_COMMUNITY): Payer: Medicare Other

## 2019-05-04 DIAGNOSIS — I495 Sick sinus syndrome: Secondary | ICD-10-CM

## 2019-05-04 DIAGNOSIS — E1122 Type 2 diabetes mellitus with diabetic chronic kidney disease: Secondary | ICD-10-CM | POA: Diagnosis not present

## 2019-05-04 DIAGNOSIS — N183 Chronic kidney disease, stage 3 unspecified: Secondary | ICD-10-CM | POA: Diagnosis not present

## 2019-05-04 DIAGNOSIS — I129 Hypertensive chronic kidney disease with stage 1 through stage 4 chronic kidney disease, or unspecified chronic kidney disease: Secondary | ICD-10-CM | POA: Diagnosis not present

## 2019-05-04 LAB — GLUCOSE, CAPILLARY: Glucose-Capillary: 131 mg/dL — ABNORMAL HIGH (ref 70–99)

## 2019-05-04 NOTE — Discharge Instructions (Signed)
° ° °  Supplemental Discharge Instructions for  Pacemaker/Defibrillator Patients  Activity No heavy lifting or vigorous activity with your left/right arm for 6 to 8 weeks.  Do not raise your left/right arm above your head for one week.  Gradually raise your affected arm as drawn below.             05/07/2019                 05/08/2019                05/09/2019               05/10/2019 __  NO DRIVING until cleared to at your wound check visit.  WOUND CARE - Keep the wound area clean and dry.  Do not get this area wet, no showers until cleared to at your wound check visit . - The tape/steri-strips on your wound will fall off; do not pull them off.  No bandage is needed on the site.  DO  NOT apply any creams, oils, or ointments to the wound area. - If you notice any drainage or discharge from the wound, any swelling or bruising at the site, or you develop a fever > 101? F after you are discharged home, call the office at once.  Special Instructions - You are still able to use cellular telephones; use the ear opposite the side where you have your pacemaker/defibrillator.  Avoid carrying your cellular phone near your device. - When traveling through airports, show security personnel your identification card to avoid being screened in the metal detectors.  Ask the security personnel to use the hand wand. - Avoid arc welding equipment, MRI testing (magnetic resonance imaging), TENS units (transcutaneous nerve stimulators).  Call the office for questions about other devices. - Avoid electrical appliances that are in poor condition or are not properly grounded. - Microwave ovens are safe to be near or to operate.

## 2019-05-11 ENCOUNTER — Other Ambulatory Visit: Payer: Self-pay

## 2019-05-11 ENCOUNTER — Ambulatory Visit (INDEPENDENT_AMBULATORY_CARE_PROVIDER_SITE_OTHER): Payer: Medicare Other | Admitting: *Deleted

## 2019-05-11 DIAGNOSIS — N184 Chronic kidney disease, stage 4 (severe): Secondary | ICD-10-CM | POA: Insufficient documentation

## 2019-05-11 DIAGNOSIS — D631 Anemia in chronic kidney disease: Secondary | ICD-10-CM

## 2019-05-11 DIAGNOSIS — N1832 Long term (current) use of insulin: Secondary | ICD-10-CM | POA: Insufficient documentation

## 2019-05-11 DIAGNOSIS — Z794 Long term (current) use of insulin: Secondary | ICD-10-CM | POA: Insufficient documentation

## 2019-05-11 DIAGNOSIS — I495 Sick sinus syndrome: Secondary | ICD-10-CM | POA: Diagnosis not present

## 2019-05-11 DIAGNOSIS — E1122 Type 2 diabetes mellitus with diabetic chronic kidney disease: Secondary | ICD-10-CM

## 2019-05-11 DIAGNOSIS — I129 Hypertensive chronic kidney disease with stage 1 through stage 4 chronic kidney disease, or unspecified chronic kidney disease: Secondary | ICD-10-CM

## 2019-05-11 HISTORY — DX: Anemia in chronic kidney disease: D63.1

## 2019-05-11 HISTORY — DX: Type 2 diabetes mellitus with diabetic chronic kidney disease: E11.22

## 2019-05-11 HISTORY — DX: Chronic kidney disease, stage 4 (severe): N18.4

## 2019-05-11 HISTORY — DX: Hypertensive chronic kidney disease with stage 1 through stage 4 chronic kidney disease, or unspecified chronic kidney disease: I12.9

## 2019-05-11 HISTORY — DX: Chronic kidney disease, stage 3b: N18.32

## 2019-05-11 LAB — CUP PACEART INCLINIC DEVICE CHECK
Battery Remaining Longevity: 62 mo
Battery Voltage: 3.04 V
Brady Statistic RA Percent Paced: 79 %
Brady Statistic RV Percent Paced: 99.99 %
Date Time Interrogation Session: 20201008135048
Implantable Lead Implant Date: 20200930
Implantable Lead Implant Date: 20200930
Implantable Lead Location: 753859
Implantable Lead Location: 753860
Implantable Pulse Generator Implant Date: 20200930
Lead Channel Impedance Value: 525 Ohm
Lead Channel Impedance Value: 575 Ohm
Lead Channel Pacing Threshold Amplitude: 0.5 V
Lead Channel Pacing Threshold Amplitude: 0.75 V
Lead Channel Pacing Threshold Pulse Width: 0.5 ms
Lead Channel Pacing Threshold Pulse Width: 0.5 ms
Lead Channel Sensing Intrinsic Amplitude: 4.8 mV
Lead Channel Setting Pacing Amplitude: 3.5 V
Lead Channel Setting Pacing Amplitude: 3.5 V
Lead Channel Setting Pacing Pulse Width: 0.5 ms
Lead Channel Setting Sensing Sensitivity: 2 mV
Pulse Gen Model: 2272
Pulse Gen Serial Number: 9159064

## 2019-05-11 NOTE — Progress Notes (Signed)
Wound check appointment. Steri-strips removed. Wound without redness or edema. Incision edges approximated, wound well healed. Normal device function. Thresholds, sensing, and impedances consistent with implant measurements. Device programmed at 3.5V/auto capture programmed on for extra safety margin until 3 month visit. Histogram distribution appropriate for patient and level of activity. No mode switches or high ventricular rates noted. Patient educated about wound care, arm mobility, lifting restrictions. Enrolled in remote monitoring , next remote 11/22/18. F/U with Dr Bettina Gavia 07/11/19 and  ROV in 3 months with Dr Curt Bears.

## 2019-05-11 NOTE — Patient Instructions (Signed)
Call the office if you have increased swelling, redness, or drainage from your wound site. Call if you develop a fever.

## 2019-07-03 ENCOUNTER — Other Ambulatory Visit: Payer: Self-pay | Admitting: Cardiology

## 2019-07-10 NOTE — Progress Notes (Signed)
Cardiology Office Note:    Date:  07/11/2019   ID:  Louis Neal, DOB 03-10-1951, MRN [id number]  PCP:  Louis Greenhouse, MD  Cardiologist:  Louis More, MD    Referring MD: Louis Greenhouse, MD    ASSESSMENT:    1. SSS (sick sinus syndrome) ([place])   2. Pacemaker   3. Paroxysmal atrial fibrillation (H[facility])   4. On amiodarone therapy   5. Chronic anticoagulation   6. Stage 3b chronic kidney disease   7. Hypertensive heart disease with heart failure ([place])   8. Statin intolerance   9. Hyperlipidemia, mixed    PLAN:    In order of problems listed above:  1. Stable post pacemaker followed by device clinic 2. No recurrent atrial fibrillation continue low-dose amiodarone I will ask his physician nephrology to also draw a CMP and lipid profile copy to me with labs next week to follow the thing twice and stick once protocol 3. Continue low-dose amiodarone effective maintaining sinus rhythm no signs of toxicity 4. He tells me that his CKD has progressed to stage IV followed by Louis Neal nephrology, I asked him to remain on an ARB 5. Blood pressure at target continue his multidrug regimen and conditional treatment as needed increase carvedilol now that bradycardia is no longer concern 6. Heart failure stable compensated continue his current diuretic 7. Chronic anticoagulation continue his anticoagulant Eliquis no dosage adjustment for kidney disease 8. He is on unusual combination of fish oil and WelChol but lipids are at target and continue same.  At this time I would not put him on PCSK9 treatment  Next appointment: 6 months   Medication Adjustments/Labs and Tests Ordered: Current medicines are reviewed at length with the patient today.  Concerns regarding medicines are outlined above.  Orders Placed This Encounter  Procedures  . Comp Met (CMET)  . TSH  . EKG 12-Lead   No orders of the defined types were placed in this encounter.   Chief Complaint  Patient presents  with  . Follow-up    After recent pacemaker    History of Present Illness:    Louis Neal is a 68 y.o. male with a hx of paroxysmal atrial fibrillation and flutter on amiodarone, sinus bradycardia, hypertension and CKD.  He had a permanent pacemaker inserted Dr. Curt Neal 05/03/2019 Stevens dual-chamber.  His course was uncomplicated. He was last seen 04/04/2019. Compliance with diet, lifestyle and medications: Yes I reviewed his device download with the patient He is due to be seen by his nephrologist next week He had a chest x-ray performed 05/04/2019 no evidence of amiodarone lung toxicity good pacemaker lead placement no pneumothorax  Pacemaker check 05/11/2019: Conclusion Wound check appointment. Steri-strips removed. Wound without redness or edema. Incision edges approximated, wound well healed. Normal device function. Thresholds, sensing, and impedances consistent with implant measurements. Device programmed at  3.5V/auto capture programmed on for extra safety margin until 3 month visit. Histogram distribution appropriate for patient and level of activity. No mode switches or high ventricular rates noted. Patient educated about wound care, arm mobility, lifting  restrictions. Enrolled in remote monitoring , next remote 11/22/18. F/U with Dr Louis Neal 07/11/19 and ROV in 3 months with Dr Louis Neal.  Medications None  Result Report Battery Remaining Longevity: 62 mo     Battery Status: Unknown       Battery Voltage: 3.04 V     Brady Statistic RA Percent Paced: 79.0 %  Brady Statistic RV Percent Paced: 99.99 %     Clinic [facility]       Date Time Interrogation Session: [id number]      Eval Rhythm: VP @ 30       Implantable Lead Implant Date: 19147829      Implantable Lead Implant Date: [id number]       Implantable Lead Location: [id number]      Implantable Lead Location: [id number]       Implantable Lead Location Detail 1: UNKNOWN      Implantable Lead  Location Detail 1: UNKNOWN       Implantable Lead Manufacturer: SJCR      Implantable Lead Manufacturer: SJCR       Implantable Lead Model: Tendril MRI [id number]      Implantable Lead Model: Tendril MRI [id number]       Implantable Lead Serial Number: [id number]      Implantable Lead Serial Number: [id number]       Implantable Pulse Generator Implant Date: [id number]      Implantable Pulse Generator Type: Implantable Pulse Generator       Lead Channel Impedance Value: 575.0 ohm     Lead Channel Impedance Value: 525.0 ohm      Lead Channel Pacing Threshold Amplitude: 0.75 V     Lead Channel Pacing Threshold Amplitude: 0.5 V      Lead Channel Pacing Threshold Pulse Width: 0.5 ms     Lead Channel Pacing Threshold Pulse Width: 0.5 ms      Lead Channel Sensing Intrinsic Amplitude: 4.8 mV     Lead Channel Setting Pacing Amplitude: 3.5 V      Lead Channel Setting Pacing Amplitude: 3.5 V     Lead Channel Setting Pacing Pulse Width: 0.5 ms      Lead Channel Setting Sensing Sensitivity: 2.0 mV     Pulse Gen Model: [id number] Assurity MRI       Pulse Gen Serial Number: [id number]      Pulse Generator Manufacturer:     .  He feels improved he has no edema shortness of breath strength and endurance are better and he is paced all the time.  I think in his case it was appropriate to place him with his demonstrated bradycardia.  His blood pressure at home is consistently 1 56-3 40 systolic at target and I told him to continue his current regimen to be seen by nephrology if additional drug therapy is needed he did well in the past with carvedilol and we can increase the dose of the drug.  He is concerned about his CKD and I told him I think he should remain on ARB at this time.  He is not having edema orthopnea chest pain or syncope.  He is not on lipid-lowering therapy being statin and Zetia intolerant  Complicated medical visit with multiple problems need to be addressed in the same  statin including management of amiodarone and potential toxicity.  Past Medical History:  Diagnosis Date  . Arthritis   . Arthropathy of lumbar facet joint 07/22/2012  . 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 Bilateral lower extremity edema 02/28/2016  . Degeneration of intervertebral disc of lumbar region 07/22/2012  . Diabetes mellitus without complication ([place])   . Diaphragmatic hernia 09/04/2016  . GERD (gastroesophageal reflux disease)   . Glaucoma 02/28/2016  . High risk medication use 01/14/2016  . Hyperlipidemia, mixed 02/28/2016  . Hypertension    dr Louis Neal  in [place]  . Low back pain  07/22/2012  . Lumbosacral radiculopathy 07/22/2012  . Obstructive sleep apnea 02/28/2016  . Olecranon bursitis 01/13/2017   Overview:  2018: left  . Orthostatic hypotension 01/14/2016  . Pre-ulcerative calluses 10/17/2015  . Renal artery stenosis ([place]) 02/28/2016  . Stucco keratoses 06/23/2016    Past Surgical History:  Procedure Laterality Date  . LUMBAR LAMINECTOMY/DECOMPRESSION MICRODISCECTOMY Bilateral 09/22/2012   Procedure: LUMBAR LAMINECTOMY/DECOMPRESSION MICRODISCECTOMY 1 LEVEL;  Surgeon: Ophelia Charter, MD;  Location: [facility];  Service: Neurosurgery;  Laterality: Bilateral;  Lumbar two-three laminectomy  . PACEMAKER IMPLANT N/A 05/03/2019   Procedure: PACEMAKER IMPLANT;  Surgeon: Constance Haw, MD;  Location: [facility];  Service: Cardiovascular;  Laterality: N/A;    Current Medications: Current Meds  Medication Sig  . amiodarone (PACERONE) 200 MG tablet TAKE ONE TABLET BY MOUTH EVERY DAY  . ammonium lactate (LAC-HYDRIN) 12 % lotion Apply 1 application topically 2 (two) times daily. to affected area  . carvedilol (COREG) 6.25 MG tablet Take 1 tablet (6.25 mg total) by mouth 2 (two) times daily.  . Cholecalciferol (VITAMIN D) 2000 units tablet Take 2,000 Units by mouth daily.   . colesevelam (WELCHOL) 625 MG tablet Take 1,875 mg by mouth 2 (two) times daily with a meal.    . Dulaglutide (TRULICITY) 1.5 ZS/0.1UX SOPN Inject 1.5 mg as directed every Thursday.   Marland Kitchen ELIQUIS 5 MG TABS tablet TAKE ONE TABLET BY MOUTH TWICE DAILY (Patient taking differently: Take 5 mg by mouth 2 (two) times daily. )  . esomeprazole (NEXIUM) 40 MG capsule Take 40 mg by mouth daily.  . ferrous sulfate 325 (65 FE) MG tablet Take 325 mg by mouth daily with breakfast.  . glucose blood (FORACARE PREMIUM V10 TEST) test strip CHECK BLOOD SUGAR FOUR TIMES DAILY  . HUMALOG KWIKPEN 100 UNIT/ML KiwkPen Inject 15-20 Units into the skin 2 (two) times daily with a meal. Inject 15 units in the morning and 20 units in the evening  . hydrALAZINE (APRESOLINE) 100 MG tablet Take 100 mg by mouth 3 (three) times daily.   Marland Kitchen latanoprost (XALATAN) 0.005 % ophthalmic solution Place 1 drop into both eyes at bedtime.   Marland Kitchen omega-3 acid ethyl esters (LOVAZA) 1 g capsule Take 2 capsules by mouth 2 (two) times daily.  Marland Kitchen PROAIR HFA 108 (90 Base) MCG/ACT inhaler Inhale 2 puffs into the lungs every 6 (six) hours as needed for wheezing.   Marland Kitchen telmisartan (MICARDIS) 40 MG tablet Take 40 mg by mouth every evening.   . torsemide (DEMADEX) 20 MG tablet Take 1 tablet (20 mg) daily then take 2 tablets (40 mg) the next day and continue to alternate.  Nelva Nay SOLOSTAR 300 UNIT/ML SOPN Inject 55 Units as directed daily.      Allergies:   Atorvastatin, Ezetimibe, and Norvasc [amlodipine besylate]   Social History   Socioeconomic History  . Marital status: Married    Spouse name: Not on file  . Number of children: Not on file  . Years of education: Not on file  . Highest education level: Not on file  Occupational History  . Not on file  Social Needs  . Financial resource strain: Not on file  . Food insecurity    Worry: Not on file    Inability: Not on file  . Transportation needs    Medical: Not on file    Non-medical: Not on file  Tobacco Use  . Smoking status: Former Smoker    Packs/day: 2.00  Years: 5.00    Pack  years: 10.00    Types: Cigarettes, Cigars  . Smokeless tobacco: Never Used  . Tobacco comment: quit 10 years ago  Substance and Sexual Activity  . Alcohol use: No  . Drug use: No  . Sexual activity: Not on file  Lifestyle  . Physical activity    Days per week: Not on file    Minutes per session: Not on file  . Stress: Not on file  Relationships  . Social Herbalist on phone: Not on file    Gets together: Not on file    Attends religious service: Not on file    Active member of club or organization: Not on file    Attends meetings of clubs or organizations: Not on file    Relationship status: Not on file  Other Topics Concern  . Not on file  Social History Narrative  . Not on file     Family History: The patient's family history includes Cancer in his mother; Liver cancer in his father. ROS:   Please see the history of present illness.    All other systems reviewed and are negative.  EKGs/Labs/Other Studies Reviewed:    The following studies were reviewed today:  EKG:  EKG ordered today and personally reviewed.  The ekg ordered today demonstrates 100% dual-chamber paced  Recent Labs: 10/14/2018: ALT 21; TSH 3.160 04/28/2019: BUN 39; Creatinine, Ser 2.18; Hemoglobin 10.6; Platelets 246; Potassium 4.6; Sodium 140  Recent Lipid Panel 01/26/2019 cholesterol 138 LDL 79 HDL 37  Physical Exam:    VS:  BP (!) 154/58 (BP Location: Right Arm, Patient Position: Sitting, Cuff Size: Normal)   Pulse 63   Ht _0  (1.778 m)   Wt 240 lb 3.2 oz (109 kg)   SpO2 99%   BMI 34.47 kg/m     Wt Readings from Last 3 Encounters:  07/11/19 240 lb 3.2 oz (109 kg)  05/04/19 236 lb 11.2 oz (107.4 kg)  04/04/19 245 lb 8 oz (111.4 kg)     GEN:  Well nourished, well developed in no acute distress HEENT: Normal NECK: No JVD; No carotid bruits LYMPHATICS: No lymphadenopathy CARDIAC: RRR, no murmurs, rubs, gallops RESPIRATORY:  Clear to auscultation without rales, wheezing or  rhonchi  ABDOMEN: Soft, non-tender, non-distended MUSCULOSKELETAL:  No edema; No deformity  SKIN: Warm and dry NEUROLOGIC:  Alert and oriented x 3 PSYCHIATRIC:  Normal affect    Signed, Louis More, MD  07/11/2019 11:57 AM    Home

## 2019-07-11 ENCOUNTER — Ambulatory Visit (INDEPENDENT_AMBULATORY_CARE_PROVIDER_SITE_OTHER): Payer: Medicare Other | Admitting: Cardiology

## 2019-07-11 ENCOUNTER — Encounter: Payer: Self-pay | Admitting: Cardiology

## 2019-07-11 ENCOUNTER — Other Ambulatory Visit: Payer: Self-pay

## 2019-07-11 VITALS — BP 154/58 | HR 63 | Ht 70.0 in | Wt 240.2 lb

## 2019-07-11 DIAGNOSIS — I495 Sick sinus syndrome: Secondary | ICD-10-CM

## 2019-07-11 DIAGNOSIS — Z950 Presence of cardiac pacemaker: Secondary | ICD-10-CM | POA: Diagnosis not present

## 2019-07-11 DIAGNOSIS — E782 Mixed hyperlipidemia: Secondary | ICD-10-CM

## 2019-07-11 DIAGNOSIS — Z79899 Other long term (current) drug therapy: Secondary | ICD-10-CM

## 2019-07-11 DIAGNOSIS — I480 Paroxysmal atrial fibrillation: Secondary | ICD-10-CM

## 2019-07-11 DIAGNOSIS — Z789 Other specified health status: Secondary | ICD-10-CM

## 2019-07-11 DIAGNOSIS — I110 Hypertensive heart disease with heart failure: Secondary | ICD-10-CM

## 2019-07-11 DIAGNOSIS — Z7901 Long term (current) use of anticoagulants: Secondary | ICD-10-CM

## 2019-07-11 DIAGNOSIS — N1832 Chronic kidney disease, stage 3b: Secondary | ICD-10-CM

## 2019-07-11 NOTE — Patient Instructions (Signed)
Medication Instructions:  Your physician recommends that you continue on your current medications as directed. Please refer to the Current Medication list given to you today.  *If you need a refill on your cardiac medications before your next appointment, please call your pharmacy*  Lab Work: Your physician recommends that you return for lab work next week at your nephrology appointment: CMP, TSH. Please have these results faxed to our office for Dr. Bettina Gavia to review at [phone].   If you have labs (blood work) drawn today and your tests are completely normal, you will receive your results only by: Marland Kitchen MyChart Message (if you have MyChart) OR . A paper copy in the mail If you have any lab test that is abnormal or we need to change your treatment, we will call you to review the results.  Testing/Procedures: You had an EKG today.   Follow-Up: At [facility], you and your health needs are our priority.  As part of our continuing mission to provide you with exceptional heart care, we have created designated Provider Care Teams.  These Care Teams include your primary Cardiologist (physician) and Advanced Practice Providers (APPs -  Physician Assistants and Nurse Practitioners) who all work together to provide you with the care you need, when you need it.  Your next appointment:   6 month(s)  The format for your next appointment:   In Person  Provider:   Shirlee More, MD

## 2019-08-22 DIAGNOSIS — L97512 Non-pressure chronic ulcer of other part of right foot with fat layer exposed: Secondary | ICD-10-CM

## 2019-08-22 HISTORY — DX: Non-pressure chronic ulcer of other part of right foot with fat layer exposed: L97.512

## 2019-08-24 ENCOUNTER — Other Ambulatory Visit: Payer: Self-pay | Admitting: Cardiology

## 2019-09-18 DIAGNOSIS — E11621 Type 2 diabetes mellitus with foot ulcer: Secondary | ICD-10-CM | POA: Insufficient documentation

## 2019-09-18 DIAGNOSIS — L97519 Non-pressure chronic ulcer of other part of right foot with unspecified severity: Secondary | ICD-10-CM

## 2019-09-18 HISTORY — DX: Non-pressure chronic ulcer of other part of right foot with unspecified severity: L97.519

## 2019-09-18 HISTORY — DX: Type 2 diabetes mellitus with foot ulcer: E11.621

## 2019-09-26 ENCOUNTER — Other Ambulatory Visit: Payer: Self-pay | Admitting: Cardiology

## 2019-10-03 DIAGNOSIS — B351 Tinea unguium: Secondary | ICD-10-CM

## 2019-10-03 HISTORY — DX: Tinea unguium: B35.1

## 2019-10-24 ENCOUNTER — Other Ambulatory Visit: Payer: Self-pay | Admitting: Cardiology

## 2019-10-24 NOTE — Telephone Encounter (Signed)
This is a [place] pt °

## 2019-11-10 ENCOUNTER — Ambulatory Visit (INDEPENDENT_AMBULATORY_CARE_PROVIDER_SITE_OTHER): Payer: Medicare PPO | Admitting: *Deleted

## 2019-11-10 DIAGNOSIS — I495 Sick sinus syndrome: Secondary | ICD-10-CM | POA: Diagnosis not present

## 2019-11-10 LAB — CUP PACEART REMOTE DEVICE CHECK
Battery Remaining Longevity: 54 mo
Battery Remaining Percentage: 95.5 %
Battery Voltage: 2.98 V
Brady Statistic AP VP Percent: 69 %
Brady Statistic AP VS Percent: 1 %
Brady Statistic AS VP Percent: 31 %
Brady Statistic AS VS Percent: 1 %
Brady Statistic RA Percent Paced: 68 %
Brady Statistic RV Percent Paced: 99 %
Date Time Interrogation Session: 20210409035020
Implantable Lead Implant Date: 20200930
Implantable Lead Implant Date: 20200930
Implantable Lead Location: 753859
Implantable Lead Location: 753860
Implantable Pulse Generator Implant Date: 20200930
Lead Channel Impedance Value: 410 Ohm
Lead Channel Impedance Value: 460 Ohm
Lead Channel Pacing Threshold Amplitude: 0.5 V
Lead Channel Pacing Threshold Amplitude: 0.75 V
Lead Channel Pacing Threshold Pulse Width: 0.5 ms
Lead Channel Pacing Threshold Pulse Width: 0.5 ms
Lead Channel Sensing Intrinsic Amplitude: 3.1 mV
Lead Channel Setting Pacing Amplitude: 3.5 V
Lead Channel Setting Pacing Amplitude: 3.5 V
Lead Channel Setting Pacing Pulse Width: 0.5 ms
Lead Channel Setting Sensing Sensitivity: 2 mV
Pulse Gen Model: 2272
Pulse Gen Serial Number: 9159064

## 2019-11-10 NOTE — Progress Notes (Signed)
PPM Remote  

## 2019-11-16 ENCOUNTER — Telehealth: Payer: Self-pay

## 2019-11-16 NOTE — Telephone Encounter (Signed)
Per Dr. Curt Bears patient needs to come into office to have outputs decreased to thresholds.   Patient called and apt, made.

## 2019-11-30 ENCOUNTER — Ambulatory Visit (INDEPENDENT_AMBULATORY_CARE_PROVIDER_SITE_OTHER): Payer: Medicare PPO | Admitting: *Deleted

## 2019-11-30 ENCOUNTER — Other Ambulatory Visit: Payer: Self-pay

## 2019-11-30 DIAGNOSIS — I495 Sick sinus syndrome: Secondary | ICD-10-CM

## 2019-11-30 LAB — CUP PACEART INCLINIC DEVICE CHECK
Battery Remaining Longevity: 92 mo
Battery Voltage: 2.98 V
Brady Statistic RA Percent Paced: 69 %
Brady Statistic RV Percent Paced: 99.51 %
Date Time Interrogation Session: 20210429084100
Implantable Lead Implant Date: 20200930
Implantable Lead Implant Date: 20200930
Implantable Lead Location: 753859
Implantable Lead Location: 753860
Implantable Pulse Generator Implant Date: 20200930
Lead Channel Impedance Value: 412.5 Ohm
Lead Channel Impedance Value: 525 Ohm
Lead Channel Pacing Threshold Amplitude: 0.75 V
Lead Channel Pacing Threshold Amplitude: 1 V
Lead Channel Pacing Threshold Pulse Width: 0.5 ms
Lead Channel Pacing Threshold Pulse Width: 0.5 ms
Lead Channel Sensing Intrinsic Amplitude: 4.8 mV
Lead Channel Sensing Intrinsic Amplitude: 6.6 mV
Lead Channel Setting Pacing Amplitude: 2 V
Lead Channel Setting Pacing Amplitude: 2.5 V
Lead Channel Setting Pacing Pulse Width: 0.5 ms
Lead Channel Setting Sensing Sensitivity: 2 mV
Pulse Gen Model: 2272
Pulse Gen Serial Number: 9159064

## 2019-11-30 NOTE — Progress Notes (Signed)
Pacemaker check in clinic. Normal device function. Thresholds, sensing, impedances consistent with previous measurements. Device programmed to maximize longevity. RA/RV outputs decreased to 2.0 V/ 2.5 V due to mature leads..No mode switch or high ventricular rates noted. Device programmed at appropriate safety margins. Histogram distribution appropriate for patient activity level. Device programmed to optimize intrinsic conduction. Estimated longevity 7.8 yrs. Patient enrolled in remote follow-up and next remote scheduled for 02/09/20. Follow up with Dr Curt Bears scheduled for 12/25/19 in [place]. Patient education completed.

## 2019-12-17 DIAGNOSIS — K31819 Angiodysplasia of stomach and duodenum without bleeding: Secondary | ICD-10-CM | POA: Insufficient documentation

## 2019-12-17 HISTORY — DX: Angiodysplasia of stomach and duodenum without bleeding: K31.819

## 2019-12-25 ENCOUNTER — Other Ambulatory Visit: Payer: Self-pay

## 2019-12-25 ENCOUNTER — Encounter: Payer: Self-pay | Admitting: Cardiology

## 2019-12-25 ENCOUNTER — Ambulatory Visit (INDEPENDENT_AMBULATORY_CARE_PROVIDER_SITE_OTHER): Payer: Medicare PPO | Admitting: Cardiology

## 2019-12-25 VITALS — BP 147/76 | HR 61 | Ht 70.0 in | Wt 233.0 lb

## 2019-12-25 DIAGNOSIS — I480 Paroxysmal atrial fibrillation: Secondary | ICD-10-CM

## 2019-12-25 NOTE — Patient Instructions (Addendum)
Medication Instructions:  Your physician has recommended you make the following change in your medication:  1. STOP AMIODARONE   *If you need a refill on your cardiac medications before your next appointment, please call your pharmacy*   Lab Work: NONE If you have labs (blood work) drawn today and your tests are completely normal, you will receive your results only by: Marland Kitchen MyChart Message (if you have MyChart) OR . A paper copy in the mail If you have any lab test that is abnormal or we need to change your treatment, we will call you to review the results.   Testing/Procedures: NONE   Follow-Up: At [facility], you and your health needs are our priority.  As part of our continuing mission to provide you with exceptional heart care, we have created designated Provider Care Teams.  These Care Teams include your primary Cardiologist (physician) and Advanced Practice Providers (APPs -  Physician Assistants and Nurse Practitioners) who all work together to provide you with the care you need, when you need it.  We recommend signing up for the patient portal called "MyChart".  Sign up information is provided on this After Visit Summary.  MyChart is used to connect with patients for Virtual Visits (Telemedicine).  Patients are able to view lab/test results, encounter notes, upcoming appointments, etc.  Non-urgent messages can be sent to your provider as well.   To learn more about what you can do with MyChart, go to [email or web address].    Your next appointment:   3 month(s)  The format for your next appointment:   In Person  Provider:   DR. Curt Bears    Cardiac Ablation Cardiac ablation is a procedure to disable (ablate) a small amount of heart tissue in very specific places. The heart has many electrical connections. Sometimes these connections are abnormal and can cause the heart to beat very fast or irregularly. Ablating some of the problem areas can improve the heart rhythm or  return it to normal. Ablation may be done for people who:  Have Wolff-Parkinson-White syndrome.  Have fast heart rhythms (tachycardia).  Have taken medicines for an abnormal heart rhythm (arrhythmia) that were not effective or caused side effects.  Have a high-risk heartbeat that may be life-threatening. During the procedure, a small incision is made in the neck or the groin, and a long, thin, flexible tube (catheter) is inserted into the incision and moved to the heart. Small devices (electrodes) on the tip of the catheter will send out electrical currents. A type of X-ray (fluoroscopy) will be used to help guide the catheter and to provide images of the heart. Tell a health care provider about:  Any allergies you have.  All medicines you are taking, including vitamins, herbs, eye drops, creams, and over-the-counter medicines.  Any problems you or family members have had with anesthetic medicines.  Any blood disorders you have.  Any surgeries you have had.  Any medical conditions you have, such as kidney failure.  Whether you are pregnant or may be pregnant. What are the risks? Generally, this is a safe procedure. However, problems may occur, including:  Infection.  Bruising and bleeding at the catheter insertion site.  Bleeding into the chest, especially into the sac that surrounds the heart. This is a serious complication.  Stroke or blood clots.  Damage to other structures or organs.  Allergic reaction to medicines or dyes.  Need for a permanent pacemaker if the normal electrical system is damaged. A pacemaker is a small  computer that sends electrical signals to the heart and helps your heart beat normally.  The procedure not being fully effective. This may not be recognized until months later. Repeat ablation procedures are sometimes required. What happens before the procedure?  Follow instructions from your health care provider about eating or drinking  restrictions.  Ask your health care provider about: ? Changing or stopping your regular medicines. This is especially important if you are taking diabetes medicines or blood thinners. ? Taking medicines such as aspirin and ibuprofen. These medicines can thin your blood. Do not take these medicines before your procedure if your health care provider instructs you not to.  Plan to have someone take you home from the hospital or clinic.  If you will be going home right after the procedure, plan to have someone with you for 24 hours. What happens during the procedure?  To lower your risk of infection: ? Your health care team will wash or sanitize their hands. ? Your skin will be washed with soap. ? Hair may be removed from the incision area.  An IV tube will be inserted into one of your veins.  You will be given a medicine to help you relax (sedative).  The skin on your neck or groin will be numbed.  An incision will be made in your neck or your groin.  A needle will be inserted through the incision and into a large vein in your neck or groin.  A catheter will be inserted into the needle and moved to your heart.  Dye may be injected through the catheter to help your surgeon see the area of the heart that needs treatment.  Electrical currents will be sent from the catheter to ablate heart tissue in desired areas. There are three types of energy that may be used to ablate heart tissue: ? Heat (radiofrequency energy). ? Laser energy. ? Extreme cold (cryoablation).  When the necessary tissue has been ablated, the catheter will be removed.  Pressure will be held on the catheter insertion area to prevent excessive bleeding.  A bandage (dressing) will be placed over the catheter insertion area. The procedure may vary among health care providers and hospitals. What happens after the procedure?  Your blood pressure, heart rate, breathing rate, and blood oxygen level will be monitored  until the medicines you were given have worn off.  Your catheter insertion area will be monitored for bleeding. You will need to lie still for a few hours to ensure that you do not bleed from the catheter insertion area.  Do not drive for 24 hours or as long as directed by your health care provider. Summary  Cardiac ablation is a procedure to disable (ablate) a small amount of heart tissue in very specific places. Ablating some of the problem areas can improve the heart rhythm or return it to normal.  During the procedure, electrical currents will be sent from the catheter to ablate heart tissue in desired areas. This information is not intended to replace advice given to you by your health care provider. Make sure you discuss any questions you have with your health care provider. Document Revised: 01/10/2018 Document Reviewed: 06/08/2016 Elsevier Patient Education  [place].

## 2019-12-25 NOTE — Progress Notes (Signed)
Electrophysiology Office Note   Date:  12/25/2019   ID:  Louis Neal, DOB 14-Jun-1951, MRN [id number]  PCP:  Algis Greenhouse, MD  Cardiologist: Bettina Gavia Primary Electrophysiologist:  Constance Haw, MD    No chief complaint on file.    History of Present Illness: Louis Neal is a 69 y.o. male who is being seen today for the evaluation of atrial fibrillation at the request of Dough, Jaymes Graff, MD. Presenting today for electrophysiology evaluation.  He has a history significant for diabetes, hyperlipidemia, hypertension, and atrial fibrillation.  His heart rates have been slow in atrial fibrillation in the 40s to 50s associated with weakness and fatigue.  He is currently on amiodarone for his atrial fibrillation.  He also has bradycardia in sinus rhythm and thus it would be difficult to titrate medications.  He is now status post Abbott dual-chamber pacemaker implanted 05/03/2019.  Today, denies symptoms of palpitations, chest pain, shortness of breath, orthopnea, PND, lower extremity edema, claudication, dizziness, presyncope, syncope, bleeding, or neurologic sequela. The patient is tolerating medications without difficulties.  Currently he feels well.  He has had no issues since his pacemaker was implanted.  He is able to do all of his daily activities without restriction.  He remains on amiodarone and is at this point uncertain whether or not he wants ablation for his atrial fibrillation.   Past Medical History:  Diagnosis Date  . Arthritis   . Arthropathy of lumbar facet joint 07/22/2012  . 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 Bilateral lower extremity edema 02/28/2016  . Degeneration of intervertebral disc of lumbar region 07/22/2012  . Diabetes mellitus without complication ([place])   . Diaphragmatic hernia 09/04/2016  . GERD (gastroesophageal reflux disease)   . Glaucoma 02/28/2016  . High risk medication use 01/14/2016  . Hyperlipidemia, mixed 02/28/2016  . Hypertension    dr  Bettina Gavia  in [place]  . Low back pain 07/22/2012  . Lumbosacral radiculopathy 07/22/2012  . Obstructive sleep apnea 02/28/2016  . Olecranon bursitis 01/13/2017   Overview:  2018: left  . Orthostatic hypotension 01/14/2016  . Pre-ulcerative calluses 10/17/2015  . Renal artery stenosis ([place]) 02/28/2016  . Stucco keratoses 06/23/2016   Past Surgical History:  Procedure Laterality Date  . LUMBAR LAMINECTOMY/DECOMPRESSION MICRODISCECTOMY Bilateral 09/22/2012   Procedure: LUMBAR LAMINECTOMY/DECOMPRESSION MICRODISCECTOMY 1 LEVEL;  Surgeon: Ophelia Charter, MD;  Location: [facility];  Service: Neurosurgery;  Laterality: Bilateral;  Lumbar two-three laminectomy  . PACEMAKER IMPLANT N/A 05/03/2019   Procedure: PACEMAKER IMPLANT;  Surgeon: Constance Haw, MD;  Location: [facility];  Service: Cardiovascular;  Laterality: N/A;     Current Outpatient Medications  Medication Sig Dispense Refill  . ammonium lactate (LAC-HYDRIN) 12 % lotion Apply 1 application topically 2 (two) times daily. to affected area  2  . carvedilol (COREG) 6.25 MG tablet TAKE ONE TABLET BY MOUTH TWICE DAILY 180 tablet 2  . Cholecalciferol (VITAMIN D) 2000 units tablet Take 2,000 Units by mouth daily.     . colesevelam (WELCHOL) 625 MG tablet Take 1,875 mg by mouth 2 (two) times daily with a meal.     . Dulaglutide (TRULICITY) 1.5 FU/9.3AT SOPN Inject 1.5 mg as directed every Thursday.     Marland Kitchen ELIQUIS 5 MG TABS tablet TAKE ONE TABLET BY MOUTH TWICE DAILY 180 tablet 1  . esomeprazole (NEXIUM) 40 MG capsule Take 40 mg by mouth daily.  6  . ferrous sulfate 325 (65 FE) MG tablet Take 325 mg  by mouth daily with breakfast.    . glucose blood (FORACARE PREMIUM V10 TEST) test strip CHECK BLOOD SUGAR FOUR TIMES DAILY    . hydrALAZINE (APRESOLINE) 100 MG tablet Take 100 mg by mouth 3 (three) times daily.   6  . latanoprost (XALATAN) 0.005 % ophthalmic solution Place 1 drop into both eyes at bedtime.     Marland Kitchen omega-3 acid ethyl  esters (LOVAZA) 1 g capsule Take 2 capsules by mouth 2 (two) times daily.  3  . torsemide (DEMADEX) 20 MG tablet TAKE ONE TABLET BY MOUTH TWICE DAILY 180 tablet 2  . TOUJEO SOLOSTAR 300 UNIT/ML SOPN Inject 55 Units as directed daily.     Marland Kitchen HUMALOG KWIKPEN 100 UNIT/ML KiwkPen Inject 15-20 Units into the skin 2 (two) times daily with a meal. Inject 15 units in the morning and 20 units in the evening  1  . PROAIR HFA 108 (90 Base) MCG/ACT inhaler Inhale 2 puffs into the lungs every 6 (six) hours as needed for wheezing.     Marland Kitchen telmisartan (MICARDIS) 40 MG tablet Take 40 mg by mouth every evening.      No current facility-administered medications for this visit.    Allergies:   Amlodipine besylate, Atorvastatin, and Ezetimibe   Social History:  The patient  reports that he has quit smoking. His smoking use included cigarettes and cigars. He has a 10.00 pack-year smoking history. He has never used smokeless tobacco. He reports that he does not drink alcohol or use drugs.   Family History:  The patient's family history includes Cancer in his mother; Liver cancer in his father.    ROS:  Please see the history of present illness.   Otherwise, review of systems is positive for none.   All other systems are reviewed and negative.   PHYSICAL EXAM: VS:  BP (!) 147/76   Pulse 61   Ht 5\' 10"  (1.778 m)   Wt 233 lb (105.7 kg)   SpO2 98%   BMI 33.43 kg/m  , BMI Body mass index is 33.43 kg/m. GEN: Well nourished, well developed, in no acute distress  HEENT: normal  Neck: no JVD, carotid bruits, or masses Cardiac: RRR; no murmurs, rubs, or gallops,no edema  Respiratory:  clear to auscultation bilaterally, normal work of breathing GI: soft, nontender, nondistended, + BS MS: no deformity or atrophy  Skin: warm and dry, device site well healed Neuro:  Strength and sensation are intact Psych: euthymic mood, full affect  EKG:  EKG is ordered today. Personal review of the ekg ordered shows AV paced   Personal review of the device interrogation today. Results in [facility]: 04/28/2019: BUN 39; Creatinine, Ser 2.18; Hemoglobin 10.6; Platelets 246; Potassium 4.6; Sodium 140    Lipid Panel  No results found for: CHOL, TRIG, HDL, CHOLHDL, VLDL, LDLCALC, LDLDIRECT   Wt Readings from Last 3 Encounters:  12/25/19 233 lb (105.7 kg)  07/11/19 240 lb 3.2 oz (109 kg)  05/04/19 236 lb 11.2 oz (107.4 kg)      Other studies Reviewed: Additional studies/ records that were reviewed today include: TTE 11/26/2017 Review of the above records today demonstrates:  - Left ventricle: The cavity size was normal. There was mild   concentric hypertrophy. Systolic function was normal. Wall motion   was normal; there were no regional wall motion abnormalities. The   study is not technically sufficient to allow evaluation of LV   diastolic function. - Mitral valve: There was moderate  regurgitation. - Left atrium: The atrium was moderately dilated.  Cardiac monitor 09/22/2018 personally reviewed Conclusion paroxysmal atrial fibrillation with a relatively slow response, Mobitz 1 second-degree AV block.  ASSESSMENT AND PLAN:  1.  Paroxysmal atrial fibrillation/typical atrial flutter: Currently on amiodarone and Eliquis.  CHA2DS2-VASc of 3.  He is still considering AF ablation.  Due to that, we will plan to stop his amiodarone.  I will see him back in 3 months and at that point we will discuss further ablation versus other antiarrhythmics.  2.  Hypertension: Mildly elevated today.-Blood pressure recordings from home which range from 10 6-1 51 systolic.  In general, it does appear that he is less than 130s.  3.  CKD stage III: Has follow-up with nephrology  4.  Sick sinus syndrome: Status post Abbott dual-chamber pacemaker implanted 05/03/2019.  Device functioning appropriately.  No changes at this time  Case discussed with primary cardiology  Current medicines are reviewed at length with the  patient today.   The patient does not have concerns regarding his medicines.  The following changes were made today: Stop amiodarone  Labs/ tests ordered today include:  Orders Placed This Encounter  Procedures  . EKG 12-Lead     Disposition:   FU with Will Camnitz 3 months  Signed, Will Meredith Leeds, MD  12/25/2019 12:33 PM     [facility] [address] [place]  [phone] (office) [phone] (fax)

## 2020-01-08 ENCOUNTER — Other Ambulatory Visit: Payer: Self-pay

## 2020-01-08 NOTE — Progress Notes (Signed)
Cardiology Office Note:    Date:  01/09/2020   ID:  Louis Neal, DOB 1951-03-11, MRN [id number]  PCP:  Algis Greenhouse, MD  Cardiologist:  Shirlee More, MD    Referring MD: Algis Greenhouse, MD    ASSESSMENT:    1. Paroxysmal atrial fibrillation (HCC)   2. SSS (sick sinus syndrome) ([place])   3. Pacemaker   4. On amiodarone therapy   5. Chronic anticoagulation   6. Hypertensive heart disease with heart failure (HCC)   7. Stage 3b chronic kidney disease    PLAN:    In order of problems listed above:  1. Louis Neal is done well he tolerates his current anticoagulant without bleeding complication he is asymptomatic from sick sinus syndrome and atrial fibrillation is now persistent and is off amiodarone.  Unsure if I can improve the quality of his life as his heart failure is well compensated and I think his biggest problem appears progressive kidney disease and I would advise him to remain in rate controlled atrial fibrillation continue his current medical regimen including his beta-blocker and anticoagulant.  BP is at target he has no longer elevated ARB.  Continue his current lipid-lowering treatment high intensity statin.   Next appointment: Next months   Medication Adjustments/Labs and Tests Ordered: Current medicines are reviewed at length with the patient today.  Concerns regarding medicines are outlined above.  No orders of the defined types were placed in this encounter.  No orders of the defined types were placed in this encounter.   No chief complaint on file.   History of Present Illness:    Louis Neal is a 69 y.o. male with a hx of  paroxysmal atrial fibrillation and flutter on amiodarone, sinus bradycardia, hypertension and CKD.  He had a permanent pacemaker inserted Dr. Curt Bears 05/03/2019 [place] dual-chamber  last seen 07/11/2019.  He is followed by [facility] nephrology with CKD that is now progressed stage IV and on visit 11/27/2019 his ARB  was discontinued.  He has been in atrial fibrillation since January and is no longer on amiodarone.  Her blood pressure runs 130-150/70 his weights are stable he does not have edema he is not short of breath and is pleased with the quality of his life.  He is considering having pulmonary vein isolation at this time I encouraged him to take a careful cautious conservative approach we will discuss further with Dr. Curt Bears. Compliance with diet, lifestyle and medications: Yes  His last pacemaker check serial 11/30/2018 and 1 independently reviewed normal device function thresholds and battery.   Past Medical History:  Diagnosis Date   Anemia due to stage 4 chronic kidney disease ([place]) 05/11/2019   Formatting of this note might be different from the original. 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 Updated per 03/08/2019 [facility] Nephrology VN   Bilateral lower extremity edema 02/28/2016   Cardiorenal syndrome with renal failure 11/14/2018   Chronic anticoagulation 08/31/2017   CKD (chronic kidney disease) stage 3, GFR 30-59 ml/min 11/30/2017   Degeneration of intervertebral disc of lumbar region 07/22/2012   Degenerative disc disease, lumbar 07/22/2012   Diabetes mellitus without complication ([place])    Diabetic ulcer of right fifth toe ([place]) 09/18/2019  Diaphragmatic hernia 09/04/2016   Elevated brain natriuretic peptide (BNP) level 10/26/2017   Essential hypertension 01/14/2016   Formatting of this note might be different from the original. 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 2021: EGD   Gastroesophageal reflux disease without  esophagitis 11/28/2015   Overview:  Managed GI  Formatting of this note might be different from the original. Managed GI   GERD (gastroesophageal reflux disease)    Glaucoma 02/28/2016   High risk medication use 01/14/2016   History of diabetic ulcer of foot 06/29/2017   Hyperkalemia 03/08/2018   Hyperlipidemia, mixed 02/28/2016   Hypertension    dr Bettina Gavia  in [place]   Hypertensive heart disease with heart failure ([place]) 01/14/2016   Hypotension arterial 01/14/2016   Low back pain 07/22/2012   Lumbosacral radiculopathy 96/78/9381   Metabolic bone disease 0/17/5102   Mobitz type 1 second degree atrioventricular block 10/14/2018   Obstructive sleep apnea 02/28/2016   Obstructive sleep apnea on CPAP 02/28/2016   Formatting of this note might be different from the original. 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 Per 8/5//2020 [facility] Nephrology VN   Ulcer of foot, right, limited to breakdown of skin ([place]) 03/29/2018   Ulcer of right foot, with fat layer exposed ([place]) 08/22/2019   Vitamin D deficiency 12/15/2017   Wellness examination 07/08/2018    Past Surgical History:  Procedure Laterality Date   LUMBAR LAMINECTOMY/DECOMPRESSION MICRODISCECTOMY Bilateral 09/22/2012   Procedure: LUMBAR LAMINECTOMY/DECOMPRESSION MICRODISCECTOMY 1 LEVEL;  Surgeon: Ophelia Charter, MD;  Location: [facility] NEURO ORS;   Service: Neurosurgery;  Laterality: Bilateral;  Lumbar two-three laminectomy   PACEMAKER IMPLANT N/A 05/03/2019   Procedure: PACEMAKER IMPLANT;  Surgeon: Constance Haw, MD;  Location: [place] CV LAB;  Service: Cardiovascular;  Laterality: N/A;    Current Medications: Current Meds  Medication Sig   ammonium lactate (LAC-HYDRIN) 12 % lotion Apply 1 application topically 2 (two) times daily. to affected area   carvedilol (COREG) 6.25 MG tablet TAKE ONE TABLET BY MOUTH TWICE DAILY   Cholecalciferol (VITAMIN D) 2000 units tablet Take 2,000 Units by mouth daily.    colesevelam (WELCHOL) 625 MG tablet Take 1,875 mg by mouth 2 (two) times daily with a meal.    Dulaglutide (TRULICITY) 1.5 HE/5.2DP SOPN Inject 1.5 mg as directed every Thursday.    ELIQUIS 5 MG TABS tablet TAKE ONE TABLET BY MOUTH TWICE DAILY   esomeprazole (NEXIUM) 40 MG capsule Take 40 mg by mouth daily.   ferrous sulfate 325 (65 FE) MG tablet Take 325 mg by mouth daily with breakfast.   glucose blood (FORACARE PREMIUM V10 TEST) test strip CHECK BLOOD SUGAR FOUR TIMES DAILY   hydrALAZINE (APRESOLINE) 100 MG tablet Take 100 mg by mouth 3 (three) times daily.    insulin aspart (NOVOLOG) 100 UNIT/ML FlexPen 15 units breakfast,  lunch 0, and supper 20. Maximum daily 50   latanoprost (XALATAN) 0.005 % ophthalmic solution Place 1 drop into both eyes at bedtime.    omega-3 acid ethyl esters (LOVAZA) 1 g capsule Take 2 capsules by mouth 2 (two) times daily.   Omega-3 Fatty Acids (FISH OIL) 1000 MG CAPS Take 2 g by mouth 2 (two) times daily with a meal.   rosuvastatin (CRESTOR) 10 MG tablet Take 10 mg by mouth daily.   torsemide (DEMADEX) 20 MG tablet TAKE ONE TABLET BY MOUTH TWICE DAILY   TOUJEO SOLOSTAR 300 UNIT/ML SOPN Inject 55 Units as directed daily.      Allergies:   Amlodipine besylate, Atorvastatin, and Ezetimibe   Social History   Socioeconomic History   Marital status: Married    Spouse name: Not  on file   Number of children: Not on file   Years of education: Not on file   Highest education level: Not on file  Occupational History   Not on file  Tobacco Use   Smoking status: Former Smoker    Packs/day: 2.00    Years: 5.00    Pack years: 10.00    Types: Cigarettes, Cigars   Smokeless tobacco: Never Used   Tobacco comment: quit 10 years ago  Substance and Sexual Activity   Alcohol use: No   Drug use: No   Sexual activity: Not on file  Other Topics Concern   Not on file  Social History Narrative   Not on file   Social Determinants of Health   Financial Resource Strain:    Difficulty of Paying Living Expenses:   Food Insecurity:    Worried About Charity fundraiser in the Last Year:    Arboriculturist in the Last Year:   Transportation Needs:    Film/video editor (Medical):    Lack of Transportation (Non-Medical):   Physical Activity:    Days of Exercise per Week:    Minutes of Exercise per Session:   Stress:    Feeling of Stress :   Social Connections:    Frequency of Communication with Friends and Family:    Frequency of Social Gatherings with Friends and Family:    Attends Religious Services:    Active Member of Clubs or Organizations:    Attends Archivist Meetings:    Marital Status:      Family History: The patient's family history includes Cancer in his mother; Liver cancer in his father. ROS:   Please see the history of present illness.    All other systems reviewed and are negative.  EKGs/Labs/Other Studies Reviewed:    The following studies were reviewed today:   Recent Labs: 04/28/2019: BUN 39; Creatinine, Ser 2.18; Hemoglobin 10.6; Platelets 246; Potassium 4.6; Sodium 140  Recent Lipid Panel 12/07/2019 CMP shows potassium 3.9 creatinine 2.40 normal liver function test CBC with a hemoglobin of 10.0 10/05/2019 cholesterol 75 LDL 28 HDL 34  Physical Exam:    VS:  BP 134/68 (BP Location: Right Arm,  Patient Position: Sitting, Cuff Size: Normal)    Pulse 68    Ht 5\' 10"  (1.778 m)    Wt 231 lb 3.2 oz (104.9 kg)    SpO2 95%    BMI 33.17 kg/m     Wt Readings from Last 3 Encounters:  01/09/20 231 lb 3.2 oz (104.9 kg)  12/25/19 233 lb (105.7 kg)  07/11/19 240 lb 3.2 oz (109 kg)     GEN:  Well  nourished, well developed in no acute distress HEENT: Normal NECK: No JVD; No carotid bruits LYMPHATICS: No lymphadenopathy CARDIAC: RRR, no murmurs, rubs, gallops RESPIRATORY:  Clear to auscultation without rales, wheezing or rhonchi  ABDOMEN: Soft, non-tender, non-distended MUSCULOSKELETAL:  No edema; No deformity  SKIN: Warm and dry NEUROLOGIC:  Alert and oriented x 3 PSYCHIATRIC:  Normal affect    Signed, Shirlee More, MD  01/09/2020 9:31 AM    [place]

## 2020-01-09 ENCOUNTER — Encounter: Payer: Self-pay | Admitting: Cardiology

## 2020-01-09 ENCOUNTER — Ambulatory Visit: Payer: Medicare PPO | Admitting: Cardiology

## 2020-01-09 ENCOUNTER — Other Ambulatory Visit: Payer: Self-pay

## 2020-01-09 VITALS — BP 134/68 | HR 68 | Ht 70.0 in | Wt 231.2 lb

## 2020-01-09 DIAGNOSIS — Z79899 Other long term (current) drug therapy: Secondary | ICD-10-CM | POA: Diagnosis not present

## 2020-01-09 DIAGNOSIS — I480 Paroxysmal atrial fibrillation: Secondary | ICD-10-CM

## 2020-01-09 DIAGNOSIS — Z950 Presence of cardiac pacemaker: Secondary | ICD-10-CM

## 2020-01-09 DIAGNOSIS — I110 Hypertensive heart disease with heart failure: Secondary | ICD-10-CM

## 2020-01-09 DIAGNOSIS — I495 Sick sinus syndrome: Secondary | ICD-10-CM | POA: Diagnosis not present

## 2020-01-09 DIAGNOSIS — Z7901 Long term (current) use of anticoagulants: Secondary | ICD-10-CM

## 2020-01-09 DIAGNOSIS — N1832 Chronic kidney disease, stage 3b: Secondary | ICD-10-CM

## 2020-01-09 NOTE — Patient Instructions (Signed)

## 2020-02-09 ENCOUNTER — Ambulatory Visit (INDEPENDENT_AMBULATORY_CARE_PROVIDER_SITE_OTHER): Payer: Medicare PPO | Admitting: *Deleted

## 2020-02-09 DIAGNOSIS — I495 Sick sinus syndrome: Secondary | ICD-10-CM | POA: Diagnosis not present

## 2020-02-10 LAB — CUP PACEART REMOTE DEVICE CHECK
Battery Remaining Longevity: 101 mo
Battery Remaining Percentage: 95.5 %
Battery Voltage: 3.01 V
Brady Statistic AP VP Percent: 58 %
Brady Statistic AP VS Percent: 1 %
Brady Statistic AS VP Percent: 41 %
Brady Statistic AS VS Percent: 1 %
Brady Statistic RA Percent Paced: 58 %
Brady Statistic RV Percent Paced: 99 %
Date Time Interrogation Session: 20210710002638
Implantable Lead Implant Date: 20200930
Implantable Lead Implant Date: 20200930
Implantable Lead Location: 753859
Implantable Lead Location: 753860
Implantable Pulse Generator Implant Date: 20200930
Lead Channel Impedance Value: 400 Ohm
Lead Channel Impedance Value: 460 Ohm
Lead Channel Pacing Threshold Amplitude: 0.75 V
Lead Channel Pacing Threshold Amplitude: 1.25 V
Lead Channel Pacing Threshold Pulse Width: 0.5 ms
Lead Channel Pacing Threshold Pulse Width: 0.5 ms
Lead Channel Sensing Intrinsic Amplitude: 2.6 mV
Lead Channel Sensing Intrinsic Amplitude: 6.7 mV
Lead Channel Setting Pacing Amplitude: 2 V
Lead Channel Setting Pacing Amplitude: 2.5 V
Lead Channel Setting Pacing Pulse Width: 0.5 ms
Lead Channel Setting Sensing Sensitivity: 2 mV
Pulse Gen Model: 2272
Pulse Gen Serial Number: 9159064

## 2020-02-13 NOTE — Progress Notes (Signed)
Remote pacemaker transmission.   

## 2020-02-20 ENCOUNTER — Other Ambulatory Visit: Payer: Self-pay | Admitting: Cardiology

## 2020-04-01 ENCOUNTER — Encounter: Payer: Self-pay | Admitting: Cardiology

## 2020-04-01 ENCOUNTER — Ambulatory Visit (INDEPENDENT_AMBULATORY_CARE_PROVIDER_SITE_OTHER): Payer: Medicare PPO | Admitting: Cardiology

## 2020-04-01 ENCOUNTER — Other Ambulatory Visit: Payer: Self-pay

## 2020-04-01 VITALS — BP 150/62 | HR 61 | Ht 70.0 in | Wt 233.0 lb

## 2020-04-01 DIAGNOSIS — I495 Sick sinus syndrome: Secondary | ICD-10-CM | POA: Diagnosis not present

## 2020-04-01 MED ORDER — CARVEDILOL 12.5 MG PO TABS
12.5000 mg | ORAL_TABLET | Freq: Two times a day (BID) | ORAL | 1 refills | Status: DC
Start: 2020-04-01 — End: 2020-09-09

## 2020-04-01 NOTE — Progress Notes (Signed)
Electrophysiology Office Note   Date:  04/01/2020   ID:  Louis Neal 01/24/1951, MRN [id number]  PCP:  Algis Greenhouse, MD  Cardiologist: Bettina Gavia Primary Electrophysiologist:  Signe Tackitt Meredith Leeds, MD    No chief complaint on file.    History of Present Illness: Louis Neal is a 69 y.o. male who is being seen today for the evaluation of atrial fibrillation at the request of Dough, Jaymes Graff, MD. Presenting today for electrophysiology evaluation.  He has a history significant for diabetes, hyperlipidemia, hypertension, and atrial fibrillation.  His heart rates have been slow in atrial fibrillation in the 40s to 50s associated with weakness and fatigue.  He is currently on amiodarone for his atrial fibrillation.  He also has bradycardia in sinus rhythm and thus it would be difficult to titrate medications.  He is now status post Abbott dual-chamber pacemaker implanted 05/03/2019.  Today, denies symptoms of palpitations, chest pain, shortness of breath, orthopnea, PND, lower extremity edema, claudication, dizziness, presyncope, syncope, bleeding, or neurologic sequela. The patient is tolerating medications without difficulties.  He brings in heart rate and blood pressure recordings today.  His blood pressures are consistently in the mid 130s to 150s.  His heart rates are generally in the 60s to 70s.  He has been doing well.  He has no chest pain or shortness of breath.  Fortunately he has not had any further episodes of atrial fibrillation.  He feels well and is able to do all of his daily activities without restriction.   Past Medical History:  Diagnosis Date  . Anemia due to stage 4 chronic kidney disease ([place]) 05/11/2019   Formatting of this note might be different from the original. Updated from 03/08/2019 [facility] Nephrology VN  . Arthritis   . Arthropathy of lumbar facet joint 07/22/2012  . 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 Benign hypertension with CKD (chronic kidney disease) stage IV  ([place]) 05/11/2019   Formatting of this note might be different from the original. Updated per 03/08/2019 [facility] Nephrology VN  . Bilateral lower extremity edema 02/28/2016  . Cardiorenal syndrome with renal failure 11/14/2018  . Chronic anticoagulation 08/31/2017  . CKD (chronic kidney disease) stage 3, GFR 30-59 ml/min 11/30/2017  . Degeneration of intervertebral disc of lumbar region 07/22/2012  . Degenerative disc disease, lumbar 07/22/2012  . Diabetes mellitus without complication ([place])   . Diabetic ulcer of right fifth toe ([place]) 09/18/2019  . Diaphragmatic hernia 09/04/2016  . Elevated brain natriuretic peptide (BNP) level 10/26/2017  . Essential hypertension 01/14/2016   Formatting of this note might be different from the original. Managed CARDS  . Facet arthropathy, lumbar 07/22/2012  . 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 2021: EGD  . Gastroesophageal reflux disease without esophagitis 11/28/2015   Overview:  Managed GI  Formatting of this note might be different from the original. Managed GI  . GERD (gastroesophageal reflux disease)   . Glaucoma 02/28/2016  . High risk medication use 01/14/2016  . History of diabetic ulcer of foot 06/29/2017  . Hyperkalemia 03/08/2018  . Hyperlipidemia, mixed 02/28/2016  . Hypertension    dr Bettina Gavia  in [place]  . Hypertensive heart disease with heart failure ([place]) 01/14/2016  . Hypotension arterial 01/14/2016  . Low back pain 07/22/2012  . Lumbosacral radiculopathy 07/22/2012  . Metabolic bone disease 3/53/6144  . Mobitz type 1 second degree atrioventricular block 10/14/2018  . Obstructive sleep apnea 02/28/2016  .  Obstructive sleep apnea on CPAP 02/28/2016   Formatting of this note might be different from the original. Managed PULM 2019: CPAP, new  . Olecranon bursitis 01/13/2017   Overview:  2018: left  . On amiodarone therapy 10/14/2018  . Onychomycosis 10/03/2019  . Orthostatic hypotension  01/14/2016  . PAF (paroxysmal atrial fibrillation) ([place]) 03/02/2017  . Persistent proteinuria 12/15/2017  . Pre-ulcerative calluses 10/17/2015  . Renal artery stenosis ([place]) 02/28/2016  . SSS (sick sinus syndrome) ([place]) 04/02/2017  . Stucco keratoses 06/23/2016  . Tachycardia-bradycardia syndrome ([employer]) 04/02/2017  . Type 2 diabetes mellitus, with long-term current use of insulin ([place]) 02/28/2016   Formatting of this note might be different from the original. Per 8/5//2020 [facility] Nephrology VN  . Ulcer of foot, right, limited to breakdown of skin ([place]) 03/29/2018  . Ulcer of right foot, with fat layer exposed ([place]) 08/22/2019  . Vitamin D deficiency 12/15/2017  . Wellness examination 07/08/2018   Past Surgical History:  Procedure Laterality Date  . LUMBAR LAMINECTOMY/DECOMPRESSION MICRODISCECTOMY Bilateral 09/22/2012   Procedure: LUMBAR LAMINECTOMY/DECOMPRESSION MICRODISCECTOMY 1 LEVEL;  Surgeon: Ophelia Charter, MD;  Location: [place] NEURO ORS;  Service: Neurosurgery;  Laterality: Bilateral;  Lumbar two-three laminectomy  . PACEMAKER IMPLANT N/A 05/03/2019   Procedure: PACEMAKER IMPLANT;  Surgeon: Constance Haw, MD;  Location: [place] CV LAB;  Service: Cardiovascular;  Laterality: N/A;     Current Outpatient Medications  Medication Sig Dispense Refill  . ammonium lactate (LAC-HYDRIN) 12 % lotion Apply 1 application topically 2 (two) times daily. to affected area  2  . carvedilol (COREG) 6.25 MG tablet TAKE ONE TABLET BY MOUTH TWICE DAILY 180 tablet 2  . Cholecalciferol (VITAMIN D) 2000 units tablet Take 2,000 Units by mouth daily.     . colesevelam (WELCHOL) 625 MG tablet Take 1,875 mg by mouth 2 (two) times daily with a meal.     . Dulaglutide (TRULICITY) 1.5 MO/2.9UT SOPN Inject 1.5 mg as directed every Thursday.     Marland Kitchen ELIQUIS 5 MG TABS tablet TAKE ONE TABLET BY MOUTH TWICE DAILY 180 tablet 1  . esomeprazole (NEXIUM) 40 MG capsule Take 40 mg by mouth daily.  6  . ferrous sulfate 325 (65  FE) MG tablet Take 325 mg by mouth daily with breakfast.    . glucose blood (FORACARE PREMIUM V10 TEST) test strip CHECK BLOOD SUGAR FOUR TIMES DAILY    . hydrALAZINE (APRESOLINE) 100 MG tablet Take 100 mg by mouth 3 (three) times daily.   6  . insulin aspart (NOVOLOG) 100 UNIT/ML FlexPen 15 units breakfast, lunch 0, and supper 20. Maximum daily 50    . latanoprost (XALATAN) 0.005 % ophthalmic solution Place 1 drop into both eyes at bedtime.     . Omega-3 Fatty Acids (FISH OIL) 1000 MG CAPS Take 2 g by mouth 2 (two) times daily with a meal.    . rosuvastatin (CRESTOR) 10 MG tablet Take 10 mg by mouth daily.    Marland Kitchen torsemide (DEMADEX) 20 MG tablet TAKE ONE TABLET BY MOUTH TWICE DAILY 180 tablet 2  . TOUJEO SOLOSTAR 300 UNIT/ML SOPN Inject 55 Units as directed daily.      No current facility-administered medications for this visit.    Allergies:   Amlodipine besylate, Atorvastatin, and Ezetimibe   Social History:  The patient  reports that he has quit smoking. His smoking use included cigarettes and cigars. He has a 10.00 pack-year smoking history. He has never used smokeless tobacco. He reports that he  does not drink alcohol and does not use drugs.   Family History:  The patient's family history includes Cancer in his mother; Liver cancer in his father.   ROS:  Please see the history of present illness.   Otherwise, review of systems is positive for none.   All other systems are reviewed and negative.   PHYSICAL EXAM: VS:  BP (!) 150/62   Pulse 61   Ht 5\' 10"  (1.778 m)   Wt 233 lb (105.7 kg)   BMI 33.43 kg/m  , BMI Body mass index is 33.43 kg/m. GEN: Well nourished, well developed, in no acute distress  HEENT: normal  Neck: no JVD, carotid bruits, or masses Cardiac: RRR; no murmurs, rubs, or gallops,no edema  Respiratory:  clear to auscultation bilaterally, normal work of breathing GI: soft, nontender, nondistended, + BS MS: no deformity or atrophy  Skin: warm and dry, device site  well healed Neuro:  Strength and sensation are intact Psych: euthymic mood, full affect  EKG:  EKG is ordered today. Personal review of the ekg ordered shows AV paced  Personal review of the device interrogation today. Results in [place]: 04/28/2019: BUN 39; Creatinine, Ser 2.18; Hemoglobin 10.6; Platelets 246; Potassium 4.6; Sodium 140    Lipid Panel  No results found for: CHOL, TRIG, HDL, CHOLHDL, VLDL, LDLCALC, LDLDIRECT   Wt Readings from Last 3 Encounters:  04/01/20 233 lb (105.7 kg)  01/09/20 231 lb 3.2 oz (104.9 kg)  12/25/19 233 lb (105.7 kg)      Other studies Reviewed: Additional studies/ records that were reviewed today include: TTE 11/26/2017 Review of the above records today demonstrates:  - Left ventricle: The cavity size was normal. There was mild   concentric hypertrophy. Systolic function was normal. Wall motion   was normal; there were no regional wall motion abnormalities. The   study is not technically sufficient to allow evaluation of LV   diastolic function. - Mitral valve: There was moderate regurgitation. - Left atrium: The atrium was moderately dilated.  Cardiac monitor 09/22/2018 personally reviewed Conclusion paroxysmal atrial fibrillation with a relatively slow response, Mobitz 1 second-degree AV block.  ASSESSMENT AND PLAN:  1.  Paroxysmal atrial fibrillation/typical atrial flutter: Currently on Eliquis with a CHA2DS2-VASc of three.  He remains in sinus rhythm.  At this point I do not feel that ablation is the right option.  No changes.  2.  Hypertension: Elevated today.  We [place] increase carvedilol to 12.5 mg.  This can be further adjusted by his primary physician.  3.  CKD stage III: Has follow-up with nephrology.  4.  Sick sinus syndrome: Status post Abbott dual-chamber pacemaker implanted 05/03/2019.  Device functioning appropriately.  No changes at this time.     Current medicines are reviewed at length with the patient  today.   The patient does not have concerns regarding his medicines.  The following changes were made today: Increase carvedilol  Labs/ tests ordered today include:  Orders Placed This Encounter  Procedures  . EKG 12-Lead     Disposition:   FU with Johnnell Liou 6 months  Signed, Mylia Pondexter Meredith Leeds, MD  04/01/2020 10:38 AM     [facility] [address] [place] [address] [phone] (office) [phone] (fax)

## 2020-04-01 NOTE — Patient Instructions (Signed)
Medication Instructions:  Your physician has recommended you make the following change in your medication:  1. INCREASE Carvedilol to 12.5 mg twice a day  *If you need a refill on your cardiac medications before your next appointment, please call your pharmacy*   Lab Work: None ordered   Testing/Procedures: None ordered   Follow-Up: Remote monitoring is used to monitor your Pacemaker of ICD from home. This monitoring reduces the number of office visits required to check your device to one time per year. It allows [place] to keep an eye on the functioning of your device to ensure it is working properly. You are scheduled for a device check from home on 05/10/2020. You may send your transmission at any time that day. If you have a wireless device, the transmission will be sent automatically. After your physician reviews your transmission, you will receive a postcard with your next transmission date.  At [facility], you and your health needs are our priority.  As part of our continuing mission to provide you with exceptional heart care, we have created designated Provider Care Teams.  These Care Teams include your primary Cardiologist (physician) and Advanced Practice Providers (APPs -  Physician Assistants and Nurse Practitioners) who all work together to provide you with the care you need, when you need it.  We recommend signing up for the patient portal called "MyChart".  Sign up information is provided on this After Visit Summary.  MyChart is used to connect with patients for Virtual Visits (Telemedicine).  Patients are able to view lab/test results, encounter notes, upcoming appointments, etc.  Non-urgent messages can be sent to your provider as well.   To learn more about what you can do with MyChart, go to [email or web address].    Your next appointment:   6 month(s)  The format for your next appointment:   In Person  Provider:   Allegra Lai, MD   Thank you for choosing [facility]!!   Trinidad Curet, RN [phone]    Other Instructions

## 2020-04-09 LAB — CUP PACEART INCLINIC DEVICE CHECK
Battery Remaining Longevity: 102 mo
Brady Statistic RA Percent Paced: 50 %
Brady Statistic RV Percent Paced: 98 %
Date Time Interrogation Session: 20210830102605
Implantable Lead Implant Date: 20200930
Implantable Lead Implant Date: 20200930
Implantable Lead Location: 753859
Implantable Lead Location: 753860
Implantable Pulse Generator Implant Date: 20200930
Lead Channel Pacing Threshold Amplitude: 0.75 V
Lead Channel Pacing Threshold Amplitude: 1.25 V
Lead Channel Pacing Threshold Pulse Width: 0.5 ms
Lead Channel Pacing Threshold Pulse Width: 0.5 ms
Lead Channel Sensing Intrinsic Amplitude: 5 mV
Lead Channel Setting Pacing Amplitude: 2 V
Lead Channel Setting Pacing Amplitude: 2.5 V
Lead Channel Setting Pacing Pulse Width: 0.5 ms
Lead Channel Setting Sensing Sensitivity: 2 mV
Pulse Gen Model: 2272
Pulse Gen Serial Number: 9159064

## 2020-05-10 ENCOUNTER — Ambulatory Visit (INDEPENDENT_AMBULATORY_CARE_PROVIDER_SITE_OTHER): Payer: Medicare PPO

## 2020-05-10 DIAGNOSIS — I495 Sick sinus syndrome: Secondary | ICD-10-CM | POA: Diagnosis not present

## 2020-05-10 LAB — CUP PACEART REMOTE DEVICE CHECK
Battery Remaining Longevity: 101 mo
Battery Remaining Percentage: 95.5 %
Battery Voltage: 2.99 V
Brady Statistic AP VP Percent: 48 %
Brady Statistic AP VS Percent: 1 %
Brady Statistic AS VP Percent: 50 %
Brady Statistic AS VS Percent: 1 %
Brady Statistic RA Percent Paced: 47 %
Brady Statistic RV Percent Paced: 99 %
Date Time Interrogation Session: 20211008020012
Implantable Lead Implant Date: 20200930
Implantable Lead Implant Date: 20200930
Implantable Lead Location: 753859
Implantable Lead Location: 753860
Implantable Pulse Generator Implant Date: 20200930
Lead Channel Impedance Value: 390 Ohm
Lead Channel Impedance Value: 480 Ohm
Lead Channel Pacing Threshold Amplitude: 0.75 V
Lead Channel Pacing Threshold Amplitude: 1.25 V
Lead Channel Pacing Threshold Pulse Width: 0.5 ms
Lead Channel Pacing Threshold Pulse Width: 0.5 ms
Lead Channel Sensing Intrinsic Amplitude: 5 mV
Lead Channel Sensing Intrinsic Amplitude: 7.6 mV
Lead Channel Setting Pacing Amplitude: 2 V
Lead Channel Setting Pacing Amplitude: 2.5 V
Lead Channel Setting Pacing Pulse Width: 0.5 ms
Lead Channel Setting Sensing Sensitivity: 2 mV
Pulse Gen Model: 2272
Pulse Gen Serial Number: 9159064

## 2020-05-14 NOTE — Progress Notes (Signed)
Remote pacemaker transmission.   

## 2020-06-06 DIAGNOSIS — M10071 Idiopathic gout, right ankle and foot: Secondary | ICD-10-CM

## 2020-06-06 HISTORY — DX: Idiopathic gout, right ankle and foot: M10.071

## 2020-06-10 ENCOUNTER — Telehealth: Payer: Self-pay

## 2020-06-10 NOTE — Telephone Encounter (Signed)
Can see AF clinic to discuss AF and possible dccv.

## 2020-06-10 NOTE — Telephone Encounter (Signed)
[facility]lin alert received 06/09/20 for persistent AF since 06/07/20. Know PAF, + Eliquis, has been maintaining SR.   Calling patient to send manual transmission, assess s/s and medication compliance.  Patient reports he has felt fine and was not aware he was back in AF. Reports compliance with coreg 12.5 mg BID, Eliquis 5 mg BID, Torsemide 20 mg BID.         Manual transmission received 06/10/20, presenting AF w/ VP 77. Advised patient I will forward to Dr. Ileana Ladd for review and recommendations. Advised patient if he becomes symptomatic or has any questions to please call DC. Verbalizes understanding.

## 2020-06-10 NOTE — Telephone Encounter (Signed)
Called and spoke with patient.  He is aware of appt 06/12/20 at 8:30 am with Roderic Palau, NP.

## 2020-06-10 NOTE — Telephone Encounter (Signed)
Calling patient to update that Dr. Curt Bears would like to patient to follow up at the [facility]. Verbalized understanding.

## 2020-06-12 ENCOUNTER — Ambulatory Visit (HOSPITAL_COMMUNITY)
Admission: RE | Admit: 2020-06-12 | Discharge: 2020-06-12 | Disposition: A | Discharge status: Home / Self Care | Payer: Medicare PPO | Source: Ambulatory Visit | Attending: Nurse Practitioner | Admitting: Nurse Practitioner

## 2020-06-12 ENCOUNTER — Other Ambulatory Visit: Payer: Self-pay

## 2020-06-12 VITALS — BP 144/72 | HR 70 | Ht 70.0 in | Wt 233.0 lb

## 2020-06-12 DIAGNOSIS — N184 Chronic kidney disease, stage 4 (severe): Secondary | ICD-10-CM | POA: Insufficient documentation

## 2020-06-12 DIAGNOSIS — I4819 Other persistent atrial fibrillation: Secondary | ICD-10-CM | POA: Diagnosis not present

## 2020-06-12 DIAGNOSIS — Z794 Long term (current) use of insulin: Secondary | ICD-10-CM | POA: Insufficient documentation

## 2020-06-12 DIAGNOSIS — Z7901 Long term (current) use of anticoagulants: Secondary | ICD-10-CM | POA: Diagnosis not present

## 2020-06-12 DIAGNOSIS — I495 Sick sinus syndrome: Secondary | ICD-10-CM | POA: Diagnosis not present

## 2020-06-12 DIAGNOSIS — I130 Hypertensive heart and chronic kidney disease with heart failure and stage 1 through stage 4 chronic kidney disease, or unspecified chronic kidney disease: Secondary | ICD-10-CM | POA: Diagnosis not present

## 2020-06-12 DIAGNOSIS — Z79899 Other long term (current) drug therapy: Secondary | ICD-10-CM | POA: Diagnosis not present

## 2020-06-12 DIAGNOSIS — I480 Paroxysmal atrial fibrillation: Secondary | ICD-10-CM

## 2020-06-12 DIAGNOSIS — E1122 Type 2 diabetes mellitus with diabetic chronic kidney disease: Secondary | ICD-10-CM | POA: Diagnosis not present

## 2020-06-12 DIAGNOSIS — Z87891 Personal history of nicotine dependence: Secondary | ICD-10-CM | POA: Insufficient documentation

## 2020-06-12 DIAGNOSIS — D6869 Other thrombophilia: Secondary | ICD-10-CM | POA: Diagnosis not present

## 2020-06-12 DIAGNOSIS — I509 Heart failure, unspecified: Secondary | ICD-10-CM | POA: Diagnosis not present

## 2020-06-12 LAB — BASIC METABOLIC PANEL
Anion gap: 14 (ref 5–15)
BUN: 43 mg/dL — ABNORMAL HIGH (ref 8–23)
CO2: 25 mmol/L (ref 22–32)
Calcium: 9.6 mg/dL (ref 8.9–10.3)
Chloride: 101 mmol/L (ref 98–111)
Creatinine, Ser: 2.23 mg/dL — ABNORMAL HIGH (ref 0.61–1.24)
GFR, Estimated: 31 mL/min — ABNORMAL LOW (ref >60)
Glucose, Bld: 246 mg/dL — ABNORMAL HIGH (ref 70–99)
Potassium: 4.2 mmol/L (ref 3.5–5.1)
Sodium: 140 mmol/L (ref 135–145)

## 2020-06-12 LAB — CBC
HCT: 40.2 % (ref 39.0–52.0)
Hemoglobin: 13.2 g/dL (ref 13.0–17.0)
MCH: 30.1 pg (ref 26.0–34.0)
MCHC: 32.8 g/dL (ref 30.0–36.0)
MCV: 91.6 fL (ref 80.0–100.0)
Platelets: 199 10*3/uL (ref 150–400)
RBC: 4.39 MIL/uL (ref 4.22–5.81)
RDW: 13.2 % (ref 11.5–15.5)
WBC: 7.2 10*3/uL (ref 4.0–10.5)
nRBC: 0 % (ref 0.0–0.2)

## 2020-06-12 LAB — TSH: TSH: 0.747 u[IU]/mL (ref 0.350–4.500)

## 2020-06-12 NOTE — Progress Notes (Addendum)
Primary Care Physician: Algis Greenhouse, MD Referring Physician: Device clinic/Dr. Delana Meyer is a 69 y.o. male with a h/o paroxysmal afib, PPM for SSS, CKD, HTN, that is in the clinic for device clinic noting persistent afib since 06/07/20. Ekg shows v paced at 70 bpm. Pt was unaware. He denies any recent change in health. He had very remote h/o use of amiodarone, but currently not on this.  He has not missed anticoagulation with a CHA2DS2VASc score of at least 4.   Today, he denies symptoms of palpitations, chest pain, shortness of breath, orthopnea, PND, lower extremity edema, dizziness, presyncope, syncope, or neurologic sequela. The patient is tolerating medications without difficulties and is otherwise without complaint today.   Past Medical History:  Diagnosis Date  . Anemia due to stage 4 chronic kidney disease ([place]) 05/11/2019   Formatting of this note might be different from the original. Updated from 03/08/2019 [facility] Nephrology VN  . Arthritis   . Arthropathy of lumbar facet joint 07/22/2012  . 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 Benign hypertension with CKD (chronic kidney disease) stage IV ([place]) 05/11/2019   Formatting of this note might be different from the original. Updated per 03/08/2019 [facility] Nephrology VN  . Bilateral lower extremity edema 02/28/2016  . Cardiorenal syndrome with renal failure 11/14/2018  . Chronic anticoagulation 08/31/2017  . CKD (chronic kidney disease) stage 3, GFR 30-59 ml/min 11/30/2017  . Degeneration of intervertebral disc of lumbar region 07/22/2012  . Degenerative disc disease, lumbar 07/22/2012  . Diabetes mellitus without complication ([place])   . Diabetic ulcer of right fifth toe ([place]) 09/18/2019  . Diaphragmatic hernia 09/04/2016  . Elevated brain natriuretic peptide (BNP) level 10/26/2017  . Essential hypertension 01/14/2016   Formatting of this note might be different from the original. Managed CARDS  . Facet arthropathy, lumbar 07/22/2012  .  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 2021: EGD  . Gastroesophageal reflux disease without esophagitis 11/28/2015   Overview:  Managed GI  Formatting of this note might be different from the original. Managed GI  . GERD (gastroesophageal reflux disease)   . Glaucoma 02/28/2016  . High risk medication use 01/14/2016  . History of diabetic ulcer of foot 06/29/2017  . Hyperkalemia 03/08/2018  . Hyperlipidemia, mixed 02/28/2016  . Hypertension    dr Bettina Gavia  in [place]  . Hypertensive heart disease with heart failure ([place]) 01/14/2016  . Hypotension arterial 01/14/2016  . Low back pain 07/22/2012  . Lumbosacral radiculopathy 07/22/2012  . Metabolic bone disease 09/14/9415  . Mobitz type 1 second degree atrioventricular block 10/14/2018  . Obstructive sleep apnea 02/28/2016  . Obstructive sleep apnea on CPAP 02/28/2016   Formatting of this note might be different from the original. Managed PULM 2019: CPAP, new  . Olecranon bursitis 01/13/2017   Overview:  2018: left  . On amiodarone therapy 10/14/2018  . Onychomycosis 10/03/2019  . Orthostatic hypotension 01/14/2016  . PAF (paroxysmal atrial fibrillation) ([place]) 03/02/2017  . Persistent proteinuria 12/15/2017  . Pre-ulcerative calluses 10/17/2015  . Renal artery stenosis ([place]) 02/28/2016  . SSS (sick sinus syndrome) ([place]) 04/02/2017  . Stucco keratoses 06/23/2016  . Tachycardia-bradycardia syndrome ([place]) 04/02/2017  . Type 2 diabetes mellitus, with long-term current use of insulin ([place]) 02/28/2016   Formatting of this note might be different from the original. Per 8/5//2020 [facility] Nephrology VN  . Ulcer of foot, right, limited to breakdown of skin (  [place]) 03/29/2018  . Ulcer of right foot, with fat layer exposed ([place]) 08/22/2019  . Vitamin D deficiency 12/15/2017  . Wellness examination 07/08/2018   Past Surgical History:  Procedure Laterality Date  . LUMBAR LAMINECTOMY/DECOMPRESSION  MICRODISCECTOMY Bilateral 09/22/2012   Procedure: LUMBAR LAMINECTOMY/DECOMPRESSION MICRODISCECTOMY 1 LEVEL;  Surgeon: Ophelia Charter, MD;  Location: [place] NEURO ORS;  Service: Neurosurgery;  Laterality: Bilateral;  Lumbar two-three laminectomy  . PACEMAKER IMPLANT N/A 05/03/2019   Procedure: PACEMAKER IMPLANT;  Surgeon: Constance Haw, MD;  Location: [place] CV LAB;  Service: Cardiovascular;  Laterality: N/A;    Current Outpatient Medications  Medication Sig Dispense Refill  . insulin glargine, 1 Unit Dial, (TOUJEO SOLOSTAR) 300 UNIT/ML Solostar Pen Inject into the skin.    Marland Kitchen ammonium lactate (LAC-HYDRIN) 12 % lotion Apply 1 application topically 2 (two) times daily. to affected area  2  . carvedilol (COREG) 12.5 MG tablet Take 1 tablet (12.5 mg total) by mouth 2 (two) times daily. 180 tablet 1  . Cholecalciferol (VITAMIN D) 2000 units tablet Take 2,000 Units by mouth daily.     . colesevelam (WELCHOL) 625 MG tablet Take 1,875 mg by mouth 2 (two) times daily with a meal.     . Dulaglutide (TRULICITY) 1.5 XT/0.6YI SOPN Inject 1.5 mg as directed every Thursday.     Marland Kitchen ELIQUIS 5 MG TABS tablet TAKE ONE TABLET BY MOUTH TWICE DAILY 180 tablet 1  . esomeprazole (NEXIUM) 40 MG capsule Take 40 mg by mouth daily.  6  . ferrous sulfate 325 (65 FE) MG tablet Take 325 mg by mouth daily with breakfast.    . glucose blood (FORACARE PREMIUM V10 TEST) test strip CHECK BLOOD SUGAR FOUR TIMES DAILY    . hydrALAZINE (APRESOLINE) 100 MG tablet Take 100 mg by mouth 3 (three) times daily.   6  . insulin aspart (NOVOLOG) 100 UNIT/ML FlexPen 15 units breakfast, lunch 0, and supper 20. Maximum daily 50    . latanoprost (XALATAN) 0.005 % ophthalmic solution Place 1 drop into both eyes at bedtime.     Marland Kitchen MITIGARE 0.6 MG CAPS     . Omega-3 Fatty Acids (FISH OIL) 1000 MG CAPS Take 2 g by mouth 2 (two) times daily with a meal.    . rosuvastatin (CRESTOR) 10 MG tablet Take 10 mg by mouth daily.    Marland Kitchen torsemide  (DEMADEX) 20 MG tablet TAKE ONE TABLET BY MOUTH TWICE DAILY 180 tablet 2  . UNIFINE PENTIPS 31G X 8 MM MISC      No current facility-administered medications for this encounter.    Allergies  Allergen Reactions  . Amlodipine Besylate Swelling and Other (See Comments)    Ankle swelling Ankle swelling  . Atorvastatin Other (See Comments)    Muscle weakness  . Ezetimibe      Muscle weakness    Social History   Socioeconomic History  . Marital status: Married    Spouse name: Not on file  . Number of children: Not on file  . Years of education: Not on file  . Highest education level: Not on file  Occupational History  . Not on file  Tobacco Use  . Smoking status: Former Smoker    Packs/day: 2.00    Years: 5.00    Pack years: 10.00    Types: Cigarettes, Cigars  . Smokeless tobacco: Never Used  . Tobacco comment: quit 10 years ago  Vaping Use  . Vaping Use: Never used  Substance and Sexual  Activity  . Alcohol use: No  . Drug use: No  . Sexual activity: Not on file  Other Topics Concern  . Not on file  Social History Narrative  . Not on file   Social Determinants of Health   Financial Resource Strain:   . Difficulty of Paying Living Expenses: Not on file  Food Insecurity:   . Worried About Charity fundraiser in the Last Year: Not on file  . Ran Out of Food in the Last Year: Not on file  Transportation Needs:   . Lack of Transportation (Medical): Not on file  . Lack of Transportation (Non-Medical): Not on file  Physical Activity:   . Days of Exercise per Week: Not on file  . Minutes of Exercise per Session: Not on file  Stress:   . Feeling of Stress : Not on file  Social Connections:   . Frequency of Communication with Friends and Family: Not on file  . Frequency of Social Gatherings with Friends and Family: Not on file  . Attends Religious Services: Not on file  . Active Member of Clubs or Organizations: Not on file  . Attends Archivist Meetings:  Not on file  . Marital Status: Not on file  Intimate Partner Violence:   . Fear of Current or Ex-Partner: Not on file  . Emotionally Abused: Not on file  . Physically Abused: Not on file  . Sexually Abused: Not on file    Family History  Problem Relation Age of Onset  . Cancer Mother   . Liver cancer Father     ROS- All systems are reviewed and negative except as per the HPI above  Physical Exam: Vitals:   06/12/20 0831  Weight: 105.7 kg  Height: 5\' 10"  (1.778 m)   Wt Readings from Last 3 Encounters:  06/12/20 105.7 kg  04/01/20 105.7 kg  01/09/20 104.9 kg    Labs: Lab Results  Component Value Date   NA 140 04/28/2019   K 4.6 04/28/2019   CL 102 04/28/2019   CO2 23 04/28/2019   GLUCOSE 135 (H) 04/28/2019   BUN 39 (H) 04/28/2019   CREATININE 2.18 (H) 04/28/2019   CALCIUM 9.4 04/28/2019   Lab Results  Component Value Date   INR 1.0 03/02/2017   No results found for: CHOL, HDL, LDLCALC, TRIG   GEN- The patient is well appearing, alert and oriented x 3 today.   Head- normocephalic, atraumatic Eyes-  Sclera clear, conjunctiva pink Ears- hearing intact Oropharynx- clear Neck- supple, no JVP Lymph- no cervical lymphadenopathy Lungs- Clear to ausculation bilaterally, normal work of breathing Heart- Regular rate and rhythm, no murmurs, rubs or gallops, PMI not laterally displaced GI- soft, NT, ND, + BS Extremities- no clubbing, cyanosis, or edema MS- no significant deformity or atrophy Skin- no rash or lesion Psych- euthymic mood, full affect Neuro- strength and sensation are intact  EKG-v paced rhythm at 70 bpm   Device clinic note- [facility] alert received 06/09/20 for persistent AF since 06/07/20. Know PAF, + Eliquis, has been maintaining SR.   Calling patient to send manual transmission, assess s/s and medication compliance.  Patient reports he has felt fine and was not aware he was back in AF. Reports compliance with coreg 12.5 mg BID, Eliquis 5 mg BID,  Torsemide 20 mg BID.     Assessment and Plan: 1. Persistent  afib Pt asymptomatic  Since 11/5 No known triggers  Continue carvedilol 12.5 mg bid  Cardioversion, risk vrs  benefit discussed and he wishes to proceed  2. CHA2DS2VASc of at least 4 Continue  eliquis 5 mg bid  States no known missed doses for the last 3 weeks   I will have him send a report to the device clinic one week after cardioversion as he lives in [place]  to assess rhythm after DCCV  F/u with Dr. Bettina Gavia 12/8 and Dr. Curt Bears in February   Butch Penny C. Vartan Kerins, [facility] [address] [place], [place] [address] [phone]

## 2020-06-12 NOTE — Patient Instructions (Signed)
Cardioversion scheduled for Friday, November 19th  - Arrive at the [facility] and go to admitting at 1030AM  - Do not eat or drink anything after midnight the night prior to your procedure.  - Take all your morning medication (except diabetic medications) with a sip of water prior to arrival.  - You will not be able to drive home after your procedure.  - Do NOT miss any doses of your blood thinner - if you should miss a dose please notify our office immediately.  - If you feel as if you go back into normal rhythm prior to scheduled cardioversion, please notify our office immediately. If your procedure is canceled in the cardioversion suite you will be charged a cancellation fee.    Send transmission on device on 11/26 so we can ensure staying in normal rhythm.

## 2020-06-12 NOTE — H&P (View-Only) (Signed)
Primary Care Physician: Algis Greenhouse, MD Referring Physician: Device clinic/Dr. Delana Meyer is a 69 y.o. male with a h/o paroxysmal afib, PPM for SSS, CKD, HTN, that is in the clinic for device clinic noting persistent afib since 06/07/20. Ekg shows v paced at 70 bpm. Pt was unaware. He denies any recent change in health. He had very remote h/o use of amiodarone, but currently on this.  He has not missed anticoagulation with a CHA2DS2VASc score of at least 4.   Today, he denies symptoms of palpitations, chest pain, shortness of breath, orthopnea, PND, lower extremity edema, dizziness, presyncope, syncope, or neurologic sequela. The patient is tolerating medications without difficulties and is otherwise without complaint today.   Past Medical History:  Diagnosis Date  . Anemia due to stage 4 chronic kidney disease ([place]) 05/11/2019   Formatting of this note might be different from the original. Updated from 03/08/2019 [facility] Nephrology VN  . Arthritis   . Arthropathy of lumbar facet joint 07/22/2012  . 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 Benign hypertension with CKD (chronic kidney disease) stage IV ([place]) 05/11/2019   Formatting of this note might be different from the original. Updated per 03/08/2019 [facility] Nephrology VN  . Bilateral lower extremity edema 02/28/2016  . Cardiorenal syndrome with renal failure 11/14/2018  . Chronic anticoagulation 08/31/2017  . CKD (chronic kidney disease) stage 3, GFR 30-59 ml/min 11/30/2017  . Degeneration of intervertebral disc of lumbar region 07/22/2012  . Degenerative disc disease, lumbar 07/22/2012  . Diabetes mellitus without complication ([place])   . Diabetic ulcer of right fifth toe ([place]) 09/18/2019  . Diaphragmatic hernia 09/04/2016  . Elevated brain natriuretic peptide (BNP) level 10/26/2017  . Essential hypertension 01/14/2016   Formatting of this note might be different from the original. Managed CARDS  . Facet arthropathy, lumbar 07/22/2012  .  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 2021: EGD  . Gastroesophageal reflux disease without esophagitis 11/28/2015   Overview:  Managed GI  Formatting of this note might be different from the original. Managed GI  . GERD (gastroesophageal reflux disease)   . Glaucoma 02/28/2016  . High risk medication use 01/14/2016  . History of diabetic ulcer of foot 06/29/2017  . Hyperkalemia 03/08/2018  . Hyperlipidemia, mixed 02/28/2016  . Hypertension    dr Bettina Gavia  in [place]  . Hypertensive heart disease with heart failure ([place]) 01/14/2016  . Hypotension arterial 01/14/2016  . Low back pain 07/22/2012  . Lumbosacral radiculopathy 07/22/2012  . Metabolic bone disease 1/61/0960  . Mobitz type 1 second degree atrioventricular block 10/14/2018  . Obstructive sleep apnea 02/28/2016  . Obstructive sleep apnea on CPAP 02/28/2016   Formatting of this note might be different from the original. Managed PULM 2019: CPAP, new  . Olecranon bursitis 01/13/2017   Overview:  2018: left  . On amiodarone therapy 10/14/2018  . Onychomycosis 10/03/2019  . Orthostatic hypotension 01/14/2016  . PAF (paroxysmal atrial fibrillation) ([place]) 03/02/2017  . Persistent proteinuria 12/15/2017  . Pre-ulcerative calluses 10/17/2015  . Renal artery stenosis ([place]) 02/28/2016  . SSS (sick sinus syndrome) ([place]) 04/02/2017  . Stucco keratoses 06/23/2016  . Tachycardia-bradycardia syndrome ([place]) 04/02/2017  . Type 2 diabetes mellitus, with long-term current use of insulin ([place]) 02/28/2016   Formatting of this note might be different from the original. Per 8/5//2020 [facility] Nephrology VN  . Ulcer of foot, right, limited to breakdown of skin ([place])  03/29/2018  . Ulcer of right foot, with fat layer exposed ([place]) 08/22/2019  . Vitamin D deficiency 12/15/2017  . Wellness examination 07/08/2018   Past Surgical History:  Procedure Laterality Date  . LUMBAR LAMINECTOMY/DECOMPRESSION  MICRODISCECTOMY Bilateral 09/22/2012   Procedure: LUMBAR LAMINECTOMY/DECOMPRESSION MICRODISCECTOMY 1 LEVEL;  Surgeon: Ophelia Charter, MD;  Location: [place] NEURO ORS;  Service: Neurosurgery;  Laterality: Bilateral;  Lumbar two-three laminectomy  . PACEMAKER IMPLANT N/A 05/03/2019   Procedure: PACEMAKER IMPLANT;  Surgeon: Constance Haw, MD;  Location: [place] CV LAB;  Service: Cardiovascular;  Laterality: N/A;    Current Outpatient Medications  Medication Sig Dispense Refill  . insulin glargine, 1 Unit Dial, (TOUJEO SOLOSTAR) 300 UNIT/ML Solostar Pen Inject into the skin.    Marland Kitchen ammonium lactate (LAC-HYDRIN) 12 % lotion Apply 1 application topically 2 (two) times daily. to affected area  2  . carvedilol (COREG) 12.5 MG tablet Take 1 tablet (12.5 mg total) by mouth 2 (two) times daily. 180 tablet 1  . Cholecalciferol (VITAMIN D) 2000 units tablet Take 2,000 Units by mouth daily.     . colesevelam (WELCHOL) 625 MG tablet Take 1,875 mg by mouth 2 (two) times daily with a meal.     . Dulaglutide (TRULICITY) 1.5 ZO/1.0RU SOPN Inject 1.5 mg as directed every Thursday.     Marland Kitchen ELIQUIS 5 MG TABS tablet TAKE ONE TABLET BY MOUTH TWICE DAILY 180 tablet 1  . esomeprazole (NEXIUM) 40 MG capsule Take 40 mg by mouth daily.  6  . ferrous sulfate 325 (65 FE) MG tablet Take 325 mg by mouth daily with breakfast.    . glucose blood (FORACARE PREMIUM V10 TEST) test strip CHECK BLOOD SUGAR FOUR TIMES DAILY    . hydrALAZINE (APRESOLINE) 100 MG tablet Take 100 mg by mouth 3 (three) times daily.   6  . insulin aspart (NOVOLOG) 100 UNIT/ML FlexPen 15 units breakfast, lunch 0, and supper 20. Maximum daily 50    . latanoprost (XALATAN) 0.005 % ophthalmic solution Place 1 drop into both eyes at bedtime.     Marland Kitchen MITIGARE 0.6 MG CAPS     . Omega-3 Fatty Acids (FISH OIL) 1000 MG CAPS Take 2 g by mouth 2 (two) times daily with a meal.    . rosuvastatin (CRESTOR) 10 MG tablet Take 10 mg by mouth daily.    Marland Kitchen torsemide  (DEMADEX) 20 MG tablet TAKE ONE TABLET BY MOUTH TWICE DAILY 180 tablet 2  . UNIFINE PENTIPS 31G X 8 MM MISC      No current facility-administered medications for this encounter.    Allergies  Allergen Reactions  . Amlodipine Besylate Swelling and Other (See Comments)    Ankle swelling Ankle swelling  . Atorvastatin Other (See Comments)    Muscle weakness  . Ezetimibe      Muscle weakness    Social History   Socioeconomic History  . Marital status: Married    Spouse name: Not on file  . Number of children: Not on file  . Years of education: Not on file  . Highest education level: Not on file  Occupational History  . Not on file  Tobacco Use  . Smoking status: Former Smoker    Packs/day: 2.00    Years: 5.00    Pack years: 10.00    Types: Cigarettes, Cigars  . Smokeless tobacco: Never Used  . Tobacco comment: quit 10 years ago  Vaping Use  . Vaping Use: Never used  Substance and Sexual Activity  .  Alcohol use: No  . Drug use: No  . Sexual activity: Not on file  Other Topics Concern  . Not on file  Social History Narrative  . Not on file   Social Determinants of Health   Financial Resource Strain:   . Difficulty of Paying Living Expenses: Not on file  Food Insecurity:   . Worried About Charity fundraiser in the Last Year: Not on file  . Ran Out of Food in the Last Year: Not on file  Transportation Needs:   . Lack of Transportation (Medical): Not on file  . Lack of Transportation (Non-Medical): Not on file  Physical Activity:   . Days of Exercise per Week: Not on file  . Minutes of Exercise per Session: Not on file  Stress:   . Feeling of Stress : Not on file  Social Connections:   . Frequency of Communication with Friends and Family: Not on file  . Frequency of Social Gatherings with Friends and Family: Not on file  . Attends Religious Services: Not on file  . Active Member of Clubs or Organizations: Not on file  . Attends Archivist Meetings:  Not on file  . Marital Status: Not on file  Intimate Partner Violence:   . Fear of Current or Ex-Partner: Not on file  . Emotionally Abused: Not on file  . Physically Abused: Not on file  . Sexually Abused: Not on file    Family History  Problem Relation Age of Onset  . Cancer Mother   . Liver cancer Father     ROS- All systems are reviewed and negative except as per the HPI above  Physical Exam: Vitals:   06/12/20 0831  Weight: 105.7 kg  Height: 5\' 10"  (1.778 m)   Wt Readings from Last 3 Encounters:  06/12/20 105.7 kg  04/01/20 105.7 kg  01/09/20 104.9 kg    Labs: Lab Results  Component Value Date   NA 140 04/28/2019   K 4.6 04/28/2019   CL 102 04/28/2019   CO2 23 04/28/2019   GLUCOSE 135 (H) 04/28/2019   BUN 39 (H) 04/28/2019   CREATININE 2.18 (H) 04/28/2019   CALCIUM 9.4 04/28/2019   Lab Results  Component Value Date   INR 1.0 03/02/2017   No results found for: CHOL, HDL, LDLCALC, TRIG   GEN- The patient is well appearing, alert and oriented x 3 today.   Head- normocephalic, atraumatic Eyes-  Sclera clear, conjunctiva pink Ears- hearing intact Oropharynx- clear Neck- supple, no JVP Lymph- no cervical lymphadenopathy Lungs- Clear to ausculation bilaterally, normal work of breathing Heart- Regular rate and rhythm, no murmurs, rubs or gallops, PMI not laterally displaced GI- soft, NT, ND, + BS Extremities- no clubbing, cyanosis, or edema MS- no significant deformity or atrophy Skin- no rash or lesion Psych- euthymic mood, full affect Neuro- strength and sensation are intact  EKG-v paced rhythm at 70 bpm   Device clinic note- [facility] alert received 06/09/20 for persistent AF since 06/07/20. Know PAF, + Eliquis, has been maintaining SR.   Calling patient to send manual transmission, assess s/s and medication compliance.  Patient reports he has felt fine and was not aware he was back in AF. Reports compliance with coreg 12.5 mg BID, Eliquis 5 mg BID,  Torsemide 20 mg BID.     Assessment and Plan: 1. Persistent  afib Pt asymptomatic  Since 11/5 No known triggers  Continue carvedilol 12.5 mg bid  Cardioversion, risk vrs benefit discussed and  he wishes to proceed  2. CHA2DS2VASc of at least 4 Continue  eliquis 5 mg bid  States no known missed doses for the last 3 weeks   I will have him send a report to the device clinic one week after cardioversion as he lives in [place]  to assess rhythm after DCCV  F/u with Dr. Bettina Gavia 12/8 and Dr. Curt Bears in February   Butch Penny C. Raiford Fetterman, [facility] [address] [place], [place] [address] [phone]

## 2020-06-19 ENCOUNTER — Other Ambulatory Visit (HOSPITAL_COMMUNITY)
Admission: RE | Admit: 2020-06-19 | Discharge: 2020-06-19 | Disposition: A | Discharge status: Home / Self Care | Payer: Medicare PPO | Source: Ambulatory Visit | Attending: Cardiovascular Disease | Admitting: Cardiovascular Disease

## 2020-06-19 DIAGNOSIS — Z20822 Contact with and (suspected) exposure to covid-19: Secondary | ICD-10-CM | POA: Diagnosis not present

## 2020-06-19 DIAGNOSIS — Z01812 Encounter for preprocedural laboratory examination: Secondary | ICD-10-CM | POA: Diagnosis present

## 2020-06-19 LAB — SARS CORONAVIRUS 2 (TAT 6-24 HRS): SARS Coronavirus 2: NEGATIVE

## 2020-06-21 ENCOUNTER — Other Ambulatory Visit: Payer: Self-pay

## 2020-06-21 ENCOUNTER — Ambulatory Visit (HOSPITAL_COMMUNITY): Payer: Medicare PPO | Admitting: Anesthesiology

## 2020-06-21 ENCOUNTER — Encounter (HOSPITAL_COMMUNITY): Payer: Self-pay | Admitting: Cardiovascular Disease

## 2020-06-21 ENCOUNTER — Ambulatory Visit (HOSPITAL_COMMUNITY)
Admission: RE | Admit: 2020-06-21 | Discharge: 2020-06-21 | Disposition: A | Discharge status: Home / Self Care | Payer: Medicare PPO | Attending: Cardiovascular Disease | Admitting: Cardiovascular Disease

## 2020-06-21 ENCOUNTER — Encounter (HOSPITAL_COMMUNITY)
Admission: RE | Disposition: A | Discharge status: Home / Self Care | Payer: Self-pay | Source: Home / Self Care | Attending: Cardiovascular Disease

## 2020-06-21 DIAGNOSIS — Z87891 Personal history of nicotine dependence: Secondary | ICD-10-CM | POA: Insufficient documentation

## 2020-06-21 DIAGNOSIS — I495 Sick sinus syndrome: Secondary | ICD-10-CM | POA: Diagnosis not present

## 2020-06-21 DIAGNOSIS — I4891 Unspecified atrial fibrillation: Secondary | ICD-10-CM | POA: Diagnosis not present

## 2020-06-21 DIAGNOSIS — Z7901 Long term (current) use of anticoagulants: Secondary | ICD-10-CM | POA: Insufficient documentation

## 2020-06-21 DIAGNOSIS — I4819 Other persistent atrial fibrillation: Secondary | ICD-10-CM | POA: Diagnosis not present

## 2020-06-21 DIAGNOSIS — Z79899 Other long term (current) drug therapy: Secondary | ICD-10-CM | POA: Insufficient documentation

## 2020-06-21 DIAGNOSIS — N184 Chronic kidney disease, stage 4 (severe): Secondary | ICD-10-CM | POA: Insufficient documentation

## 2020-06-21 DIAGNOSIS — I129 Hypertensive chronic kidney disease with stage 1 through stage 4 chronic kidney disease, or unspecified chronic kidney disease: Secondary | ICD-10-CM | POA: Diagnosis not present

## 2020-06-21 DIAGNOSIS — Z950 Presence of cardiac pacemaker: Secondary | ICD-10-CM | POA: Insufficient documentation

## 2020-06-21 DIAGNOSIS — Z888 Allergy status to other drugs, medicaments and biological substances status: Secondary | ICD-10-CM | POA: Insufficient documentation

## 2020-06-21 DIAGNOSIS — E1122 Type 2 diabetes mellitus with diabetic chronic kidney disease: Secondary | ICD-10-CM | POA: Diagnosis not present

## 2020-06-21 DIAGNOSIS — Z794 Long term (current) use of insulin: Secondary | ICD-10-CM | POA: Insufficient documentation

## 2020-06-21 HISTORY — PX: CARDIOVERSION: SHX1299

## 2020-06-21 SURGERY — CARDIOVERSION
Anesthesia: General

## 2020-06-21 MED ORDER — SODIUM CHLORIDE 0.9 % IV SOLN: INTRAVENOUS | Status: DC | PRN

## 2020-06-21 MED ORDER — PROPOFOL 10 MG/ML IV BOLUS
INTRAVENOUS | Status: DC | PRN
  Administered 2020-06-21: 60 mg via INTRAVENOUS

## 2020-06-21 NOTE — Anesthesia Preprocedure Evaluation (Signed)
Anesthesia Evaluation  Patient identified by MRN, date of birth, ID band Patient awake    Reviewed: Allergy & Precautions, NPO status , Patient's Chart, lab work & pertinent test results, reviewed documented beta blocker date and time   History of Anesthesia Complications Negative for: history of anesthetic complications  Airway Mallampati: III  TM Distance: >3 FB Neck ROM: Full    Dental  (+) Dental Advisory Given   Pulmonary sleep apnea , former smoker,  Covid-19 Nucleic Acid Test Results Lab Results      Component                Value               Date                      [place]              NEGATIVE            06/19/2020                [place]              NOT DETECTED        04/29/2019              breath sounds clear to auscultation       Cardiovascular hypertension, Pt. on medications and Pt. on home beta blockers + Peripheral Vascular Disease  + dysrhythmias  Rhythm:Irregular     Neuro/Psych  Neuromuscular disease negative psych ROS   GI/Hepatic Neg liver ROS, GERD  Medicated and Controlled,  Endo/Other  diabetes  Renal/GU CRFRenal diseaseLab Results      Component                Value               Date                      CREATININE               2.23 (H)            06/12/2020                Musculoskeletal  (+) Arthritis ,   Abdominal   Peds  Hematology Lab Results      Component                Value               Date                      WBC                      7.2                 06/12/2020                HGB                      13.2                06/12/2020                HCT                      40.2  06/12/2020                MCV                      91.6                06/12/2020                PLT                      199                 06/12/2020            eliquis for afib   Anesthesia Other Findings   Reproductive/Obstetrics                              Anesthesia Physical Anesthesia Plan  ASA: III  Anesthesia Plan: General   Post-op Pain Management:    Induction: Intravenous  PONV Risk Score and Plan: 2 and Treatment may vary due to age or medical condition  Airway Management Planned: Mask  Additional Equipment: None  Intra-op Plan:   Post-operative Plan:   Informed Consent: I have reviewed the patients History and Physical, chart, labs and discussed the procedure including the risks, benefits and alternatives for the proposed anesthesia with the patient or authorized representative who has indicated his/her understanding and acceptance.     Dental advisory given  Plan Discussed with: CRNA and Surgeon  Anesthesia Plan Comments:         Anesthesia Quick Evaluation

## 2020-06-21 NOTE — Discharge Instructions (Signed)
Electrical Cardioversion Electrical cardioversion is the delivery of a jolt of electricity to restore a normal rhythm to the heart. A rhythm that is too fast or is not regular keeps the heart from pumping well. In this procedure, sticky patches or metal paddles are placed on the chest to deliver electricity to the heart from a device. This procedure may be done in an emergency if:  There is low or no blood pressure as a result of the heart rhythm.  Normal rhythm must be restored as fast as possible to protect the brain and heart from further damage.  It may save a life. This may also be a scheduled procedure for irregular or fast heart rhythms that are not immediately life-threatening. Tell a health care provider about:  Any allergies you have.  All medicines you are taking, including vitamins, herbs, eye drops, creams, and over-the-counter medicines.  Any problems you or family members have had with anesthetic medicines.  Any blood disorders you have.  Any surgeries you have had.  Any medical conditions you have.  Whether you are pregnant or may be pregnant. What are the risks? Generally, this is a safe procedure. However, problems may occur, including:  Allergic reactions to medicines.  A blood clot that breaks free and travels to other parts of your body.  The possible return of an abnormal heart rhythm within hours or days after the procedure.  Your heart stopping (cardiac arrest). This is rare. What happens before the procedure? Medicines  Your health care provider may have you start taking: ? Blood-thinning medicines (anticoagulants) so your blood does not clot as easily. ? Medicines to help stabilize your heart rate and rhythm.  Ask your health care provider about: ? Changing or stopping your regular medicines. This is especially important if you are taking diabetes medicines or blood thinners. ? Taking medicines such as aspirin and ibuprofen. These medicines can  thin your blood. Do not take these medicines unless your health care provider tells you to take them. ? Taking over-the-counter medicines, vitamins, herbs, and supplements. General instructions  Follow instructions from your health care provider about eating or drinking restrictions.  Plan to have someone take you home from the hospital or clinic.  If you will be going home right after the procedure, plan to have someone with you for 24 hours.  Ask your health care provider what steps will be taken to help prevent infection. These may include washing your skin with a germ-killing soap. What happens during the procedure?   An IV will be inserted into one of your veins.  Sticky patches (electrodes) or metal paddles may be placed on your chest.  You will be given a medicine to help you relax (sedative).  An electrical shock will be delivered. The procedure may vary among health care providers and hospitals. What can I expect after the procedure?  Your blood pressure, heart rate, breathing rate, and blood oxygen level will be monitored until you leave the hospital or clinic.  Your heart rhythm will be watched to make sure it does not change.  You may have some redness on the skin where the shocks were given. Follow these instructions at home:  Do not drive for 24 hours if you were given a sedative during your procedure.  Take over-the-counter and prescription medicines only as told by your health care provider.  Ask your health care provider how to check your pulse. Check it often.  Rest for 48 hours after the procedure or   as told by your health care provider.  Avoid or limit your caffeine use as told by your health care provider.  Keep all follow-up visits as told by your health care provider. This is important. Contact a health care provider if:  You feel like your heart is beating too quickly or your pulse is not regular.  You have a serious muscle cramp that does not go  away. Get help right away if:  You have discomfort in your chest.  You are dizzy or you feel faint.  You have trouble breathing or you are short of breath.  Your speech is slurred.  You have trouble moving an arm or leg on one side of your body.  Your fingers or toes turn cold or blue. Summary  Electrical cardioversion is the delivery of a jolt of electricity to restore a normal rhythm to the heart.  This procedure may be done right away in an emergency or may be a scheduled procedure if the condition is not an emergency.  Generally, this is a safe procedure.  After the procedure, check your pulse often as told by your health care provider. This information is not intended to replace advice given to you by your health care provider. Make sure you discuss any questions you have with your health care provider. Document Revised: 02/20/2019 Document Reviewed: 02/20/2019 Elsevier Patient Education  2020 Elsevier Inc.  

## 2020-06-21 NOTE — Interval H&P Note (Signed)
History and Physical Interval Note:  06/21/2020 10:09 AM  Louis Neal  has presented today for surgery, with the diagnosis of AFIB.  The various methods of treatment have been discussed with the patient and family. After consideration of risks, benefits and other options for treatment, the patient has consented to  Procedure(s): CARDIOVERSION (N/A) as a surgical intervention.  The patient's history has been reviewed, patient examined, no change in status, stable for surgery.  I have reviewed the patient's chart and labs.  Questions were answered to the patient's satisfaction.     Del Overfelt

## 2020-06-21 NOTE — Op Note (Signed)
Procedure: Electrical Cardioversion Indications:  Atrial Fibrillation  Procedure Details:  Consent: Risks of procedure as well as the alternatives and risks of each were explained to the (patient/caregiver).  Consent for procedure obtained.  Time Out: Verified patient identification, verified procedure, site/side was marked, verified correct patient position, special equipment/implants available, medications/allergies/relevent history reviewed, required imaging and test results available.  Performed  Patient placed on cardiac monitor, pulse oximetry, supplemental oxygen as necessary.  Sedation given: propofol 50 mg IV, Dr. Ermalene Postin Pacer pads placed anterior and posterior chest.  Cardioverted 1 time(s).  Cardioversion with synchronized biphasic 150J shock.  Evaluation: Findings: Post procedure EKG shows: A-sensed (sinus), V paced rhythm Complications: None Patient did tolerate procedure well.  Comprehensive pacemaker check before and after the procedure showed normal device function. Underlying rhythm prior to shock was slow ventricular rate at 35 bpm.  Time Spent Directly with the Patient:  30 minutes   Louis Neal 06/21/2020, 10:48 AM

## 2020-06-21 NOTE — Anesthesia Procedure Notes (Signed)
Procedure Name: General with mask airway Date/Time: 06/21/2020 10:41 AM Performed by: Imagene Riches, CRNA Pre-anesthesia Checklist: Emergency Drugs available, Patient identified, Suction available, Patient being monitored and Timeout performed Patient Re-evaluated:Patient Re-evaluated prior to induction Oxygen Delivery Method: Ambu bag

## 2020-06-21 NOTE — Anesthesia Postprocedure Evaluation (Signed)
Anesthesia Post Note  Patient: Louis Neal  Procedure(s) Performed: CARDIOVERSION (N/A )     Patient location during evaluation: Endoscopy Anesthesia Type: General Level of consciousness: awake and alert Pain management: pain level controlled Vital Signs Assessment: post-procedure vital signs reviewed and stable Respiratory status: spontaneous breathing, nonlabored ventilation, respiratory function stable and patient connected to nasal cannula oxygen Cardiovascular status: stable Postop Assessment: no apparent nausea or vomiting Anesthetic complications: no   No complications documented.  Last Vitals:  Vitals:   06/21/20 1045 06/21/20 1054  BP: (!) 119/58 (!) 137/49  Pulse: 66 77  Resp: 19 14  Temp: 36.5 C   SpO2: 97% 98%    Last Pain:  Vitals:   06/21/20 1054  TempSrc:   PainSc: 0-No pain                 Coby Shrewsberry

## 2020-06-21 NOTE — Transfer of Care (Signed)
Immediate Anesthesia Transfer of Care Note  Patient: Louis Neal  Procedure(s) Performed: CARDIOVERSION (N/A )  Patient Location: Endoscopy Unit  Anesthesia Type:General  Level of Consciousness: drowsy  Airway & Oxygen Therapy: Patient Spontanous Breathing  Post-op Assessment: Report given to RN and Post -op Vital signs reviewed and stable  Post vital signs: Reviewed and stable  Last Vitals:  Vitals Value Taken Time  BP    Temp    Pulse    Resp    SpO2      Last Pain:  Vitals:   06/21/20 1025  TempSrc: Oral  PainSc: 0-No pain         Complications: No complications documented.

## 2020-06-23 ENCOUNTER — Encounter (HOSPITAL_COMMUNITY): Payer: Self-pay | Admitting: Cardiovascular Disease

## 2020-06-28 LAB — CUP PACEART REMOTE DEVICE CHECK
Battery Remaining Longevity: 102 mo
Battery Remaining Percentage: 95.5 %
Battery Voltage: 3.01 V
Brady Statistic AP VP Percent: 32 %
Brady Statistic AP VS Percent: 1 %
Brady Statistic AS VP Percent: 68 %
Brady Statistic AS VS Percent: 1 %
Brady Statistic RA Percent Paced: 22 %
Brady Statistic RV Percent Paced: 99 %
Date Time Interrogation Session: 20211126074719
Implantable Lead Implant Date: 20200930
Implantable Lead Implant Date: 20200930
Implantable Lead Location: 753859
Implantable Lead Location: 753860
Implantable Pulse Generator Implant Date: 20200930
Lead Channel Impedance Value: 390 Ohm
Lead Channel Impedance Value: 510 Ohm
Lead Channel Pacing Threshold Amplitude: 0.75 V
Lead Channel Pacing Threshold Amplitude: 1.25 V
Lead Channel Pacing Threshold Pulse Width: 0.5 ms
Lead Channel Pacing Threshold Pulse Width: 0.5 ms
Lead Channel Sensing Intrinsic Amplitude: 5 mV
Lead Channel Sensing Intrinsic Amplitude: 5.7 mV
Lead Channel Setting Pacing Amplitude: 2 V
Lead Channel Setting Pacing Amplitude: 2.5 V
Lead Channel Setting Pacing Pulse Width: 0.5 ms
Lead Channel Setting Sensing Sensitivity: 2 mV
Pulse Gen Model: 2272
Pulse Gen Serial Number: 9159064

## 2020-07-01 ENCOUNTER — Telehealth: Payer: Self-pay

## 2020-07-01 ENCOUNTER — Ambulatory Visit (INDEPENDENT_AMBULATORY_CARE_PROVIDER_SITE_OTHER): Payer: Medicare PPO

## 2020-07-01 DIAGNOSIS — I495 Sick sinus syndrome: Secondary | ICD-10-CM

## 2020-07-01 NOTE — Telephone Encounter (Signed)
-----   Message from Juluis Mire, RN sent at 06/12/2020  4:30 PM EST ----- Regarding: transmission Pt to send transmission 11/26 to ensure remaining in NSR post cardioversion that is scheduled for 11/19. Please let donna know transmission results. Thanks [employer]

## 2020-07-01 NOTE — Telephone Encounter (Signed)
Manual transmission received.  Presenting Rhythm is SR, however patient has had several AF episodes since DCCV on 11/19., the most recent was 11/26 lasting for 1 day.

## 2020-07-02 NOTE — Addendum Note (Signed)
Encounter addended by: Sherran Needs, NP on: 07/02/2020 8:23 AM  Actions taken: Clinical Note Signed

## 2020-07-03 DIAGNOSIS — E119 Type 2 diabetes mellitus without complications: Secondary | ICD-10-CM | POA: Insufficient documentation

## 2020-07-03 DIAGNOSIS — I10 Essential (primary) hypertension: Secondary | ICD-10-CM | POA: Insufficient documentation

## 2020-07-03 DIAGNOSIS — M1990 Unspecified osteoarthritis, unspecified site: Secondary | ICD-10-CM | POA: Insufficient documentation

## 2020-07-03 DIAGNOSIS — K219 Gastro-esophageal reflux disease without esophagitis: Secondary | ICD-10-CM | POA: Insufficient documentation

## 2020-07-05 NOTE — Telephone Encounter (Signed)
Per Roderic Palau NP --- Let pt know that he was in SR at time of transmission but still in and out AF. He was on amiodarone at one time. He has an appointment with Dr. Bettina Gavia coming up and may want to discuss if there is a need to go back on it.    Patient states his Jodelle Red does show afib intermittently but not every time. He does not know when in or out by symptoms. He will discuss with Dr. Bettina Gavia.

## 2020-07-08 NOTE — Progress Notes (Signed)
Remote pacemaker transmission.   

## 2020-07-10 NOTE — Progress Notes (Signed)
Cardiology Office Note:    Date:  07/11/2020   ID:  Louis Neal, DOB 09/14/1950, MRN [id number]  PCP:  Louis Greenhouse, MD  Cardiologist:  Louis More, MD    Referring MD: Louis Greenhouse, MD    ASSESSMENT:    1. SSS (sick sinus syndrome) (Louis Neal)   2. Paroxysmal atrial fibrillation ([place])   3. On amiodarone therapy   4. Pacemaker   5. Chronic anticoagulation   6. Hypertensive heart disease with heart failure (Louis Neal)   7. Chronic kidney disease (CKD), stage IV (severe) (Louis Neal)   8. Hyperlipidemia, mixed    PLAN:    In order of problems listed above:  1. Louis Neal is doing better his arrhythmia is controlled with a backup pacemaker I suspect he is having paroxysmal atrial fibrillation we will go ahead and resume amiodarone now that there is no concerns of bradycardia continue his current anticoagulant. 2. Marked improvement BP at target continue his current multidrug regimen including beta-blocker and other he has a backup pacemaker heart failure is compensated he has no edema continue his current loop diuretic 3. Follows with Neal due to be seen next month 4. Continue the high intensity statin lipid profile in September shows a cholesterol of 84 LDL 27 HDL 34   Next appointment: 6 months   Medication Adjustments/Labs and Tests Ordered: Current medicines are reviewed at length with the patient today.  Concerns regarding medicines are outlined above.  No orders of the defined types were placed in this encounter.  No orders of the defined types were placed in this encounter.   Chief Complaint  Patient presents with  . Follow-up  . Atrial Fibrillation  . Anticoagulation    History of Present Illness:    Louis Neal is a 69 y.o. male with a hx of paroxysmal atrial fibrillation and flutter on amiodarone sinus bradycardia hypertension CKD.  He had a permanent dual-chamber pacemaker inserted Dr. Curt Neal 05/03/2019 Louis Neal dual-chamber.  He is followed Louis Neal Neal for CKD that is progressed to stage IV.  He had cardioversion Louis Neal successfully resuming sinus rhythm 06/21/2020.  Compliance with diet, lifestyle and medications: Yes  Louis Neal is doing much better Home blood pressure 606-3 50 systolic and he continues to follow with Neal regarding CKD he says that his iPhone adapter tells him he has atrial fibrillation today he is in sinus rhythm dual-chamber paced.  He has no bleeding complication of his anticoagulant blood pressure is at target he reminds me he is off amiodarone that was stopped because of bradycardia.  We will go ahead and place him back on amiodarone now that he has a backup pacemaker and he will be seen his nephrologist.  No chest pain edema shortness of breath palpitation or syncope Past Medical History:  Diagnosis Date  . Anemia due to stage 4 chronic kidney disease (Louis Neal) 05/11/2019   Formatting of this note might be different from the original. Updated from 03/08/2019 Louis Neal Neal  . Arthritis   . Arthropathy of lumbar facet joint 07/22/2012  . 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 Benign hypertension with CKD (chronic kidney disease) stage IV (Louis Neal) 05/11/2019   Formatting of this note might be different from the original. Updated per 03/08/2019 Louis Neal  . Bilateral lower extremity edema 02/28/2016  . Cardiorenal syndrome with renal failure 11/14/2018  . Chronic anticoagulation 08/31/2017  . CKD (chronic kidney disease) stage 3, GFR 30-59 ml/min (Louis Neal) 11/30/2017  . Degeneration of  intervertebral disc of lumbar region 07/22/2012  . Degenerative disc disease, lumbar 07/22/2012  . Diabetes mellitus without complication (Louis Neal)   . Diabetic ulcer of right fifth toe (Louis Neal) 09/18/2019  . Diaphragmatic hernia 09/04/2016  . Elevated brain natriuretic peptide (BNP) level 10/26/2017  . Essential hypertension 01/14/2016   Formatting of this note might be different from the original. Managed CARDS  . Facet  arthropathy, lumbar 07/22/2012  . 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 2021: EGD  . Gastroesophageal reflux disease without esophagitis 11/28/2015   Overview:  Managed GI  Formatting of this note might be different from the original. Managed GI  . GERD (gastroesophageal reflux disease)   . Glaucoma 02/28/2016  . High risk medication use 01/14/2016  . History of diabetic ulcer of foot 06/29/2017  . Hyperkalemia 03/08/2018  . Hyperlipidemia, mixed 02/28/2016  . Hypertension    dr Bettina Gavia  in [place]  . Hypertensive heart disease with heart failure (Louis Neal) 01/14/2016  . Hypotension arterial 01/14/2016  . Low back pain 07/22/2012  . Lumbosacral radiculopathy 07/22/2012  . Metabolic bone disease 12/03/7739  . Mobitz type 1 second degree atrioventricular block 10/14/2018  . Obstructive sleep apnea 02/28/2016  . Obstructive sleep apnea on CPAP 02/28/2016   Formatting of this note might be different from the original. Managed PULM 2019: CPAP, new  . Olecranon bursitis 01/13/2017   Overview:  2018: left  . On amiodarone therapy 10/14/2018  . Onychomycosis 10/03/2019  . Orthostatic hypotension 01/14/2016  . PAF (paroxysmal atrial fibrillation) ([place]) 03/02/2017  . Persistent proteinuria 12/15/2017  . Pre-ulcerative calluses 10/17/2015  . Renal artery stenosis ([place]) 02/28/2016  . SSS (sick sinus syndrome) ([place]) 04/02/2017  . Stucco keratoses 06/23/2016  . Tachycardia-bradycardia syndrome ([place]) 04/02/2017  . Type 2 diabetes mellitus, with long-term current use of insulin (Louis Neal) 02/28/2016   Formatting of this note might be different from the original. Per 8/5//2020 [facility] Neal Neal  . Ulcer of foot, right, limited to breakdown of skin ([place]) 03/29/2018  . Ulcer of right foot, with fat layer exposed ([place]) 08/22/2019  . Vitamin D deficiency 12/15/2017  . Wellness examination 07/08/2018    Past Surgical History:  Procedure Laterality Date  .  CARDIOVERSION N/A 06/21/2020   Procedure: CARDIOVERSION;  Surgeon: Sanda Klein, MD;  Location: [facility] END[facility];  Service: Cardiovascular;  Laterality: N/A;  . LUMBAR LAMINECTOMY/DECOMPRESSION MICRODISCECTOMY Bilateral 09/22/2012   Procedure: LUMBAR LAMINECTOMY/DECOMPRESSION MICRODISCECTOMY 1 LEVEL;  Surgeon: Ophelia Charter, MD;  Location: [facility];  Service: Neurosurgery;  Laterality: Bilateral;  Lumbar two-three laminectomy  . PACEMAKER IMPLANT N/A 05/03/2019   Procedure: PACEMAKER IMPLANT;  Surgeon: Constance Haw, MD;  Location: [facility] CV LAB;  Service: Cardiovascular;  Laterality: N/A;    Current Medications: Current Meds  Medication Sig  . ammonium lactate (LAC-HYDRIN) 12 % lotion Apply 1 application topically 2 (two) times daily. Applied to feet  . carvedilol (COREG) 12.5 MG tablet Take 1 tablet (12.5 mg total) by mouth 2 (two) times daily.  . Cholecalciferol (VITAMIN D) 2000 units tablet Take 2,000 Units by mouth daily with breakfast.   . colesevelam (WELCHOL) 625 MG tablet Take 1,875 mg by mouth 2 (two) times daily with a meal.   . Dulaglutide 1.5 MG/0.5ML SOPN Inject 1.5 mg into the skin every Thursday.   Marland Kitchen ELIQUIS 5 MG TABS tablet TAKE ONE TABLET BY MOUTH TWICE DAILY (Patient taking differently: Take 5 mg by mouth 2 (two)  times daily.)  . esomeprazole (NEXIUM) 40 MG capsule Take 40 mg by mouth daily before breakfast.   . ferrous sulfate 325 (65 FE) MG tablet Take 325 mg by mouth daily with breakfast.  . glucose blood test strip CHECK BLOOD SUGAR FOUR TIMES DAILY  . hydrALAZINE (APRESOLINE) 100 MG tablet Take 100 mg by mouth 3 (three) times daily.   . insulin aspart (NOVOLOG) 100 UNIT/ML FlexPen Inject 30 Units into the skin daily with supper.   . insulin glargine, 1 Unit Dial, (TOUJEO SOLOSTAR) 300 UNIT/ML Solostar Pen Inject 50 Units into the skin daily.   Marland Kitchen latanoprost (XALATAN) 0.005 % ophthalmic solution Place 1 drop into both eyes at bedtime.   Marland Kitchen MITIGARE 0.6  MG CAPS Take 0.6 mg by mouth daily.   . mupirocin ointment (BACTROBAN) 2 % Apply 1 application topically 2 (two) times daily as needed for wound care.  . omega-3 acid ethyl esters (LOVAZA) 1 g capsule Take 2 g by mouth 2 (two) times daily.  . rosuvastatin (CRESTOR) 10 MG tablet Take 10 mg by mouth daily with supper.   . torsemide (DEMADEX) 20 MG tablet TAKE ONE TABLET BY MOUTH TWICE DAILY (Patient taking differently: Take 20 mg by mouth 2 (two) times daily.)  . UNIFINE PENTIPS 31G X 8 MM MISC   . vitamin B-12 (CYANOCOBALAMIN) 1000 MCG tablet Take 1,000 mcg by mouth daily.     Allergies:   Amlodipine besylate, Atorvastatin, and Ezetimibe   Social History   Socioeconomic History  . Marital status: Married    Spouse name: Not on file  . Number of children: Not on file  . Years of education: Not on file  . Highest education level: Not on file  Occupational History  . Not on file  Tobacco Use  . Smoking status: Former Smoker    Packs/day: 2.00    Years: 5.00    Pack years: 10.00    Types: Cigarettes, Cigars  . Smokeless tobacco: Never Used  . Tobacco comment: quit 10 years ago  Vaping Use  . Vaping Use: Never used  Substance and Sexual Activity  . Alcohol use: No  . Drug use: No  . Sexual activity: Not on file  Other Topics Concern  . Not on file  Social History Narrative  . Not on file   Social Determinants of Health   Financial Resource Strain: Not on file  Food Insecurity: Not on file  Transportation Needs: Not on file  Physical Activity: Not on file  Stress: Not on file  Social Connections: Not on file     Family History: The patient's family history includes Cancer in his mother; Liver cancer in his father. ROS:   Please see the history of present illness.    All other systems reviewed and are negative.  EKGs/Labs/Other Studies Reviewed:    The following studies were reviewed today:  EKG:  EKG ordered today and personally reviewed.  The ekg ordered today  demonstrates dual-chamber paced rhythm  Recent Labs: 06/12/2020: BUN 43; Creatinine, Ser 2.23; Hemoglobin 13.2; Platelets 199; Potassium 4.2; Sodium 140; TSH 0.747  Recent Lipid Panel No results found for: CHOL, TRIG, HDL, CHOLHDL, VLDL, LDLCALC, LDLDIRECT  Physical Exam:    VS:  BP (!) 142/64   Pulse 63   Ht 5\' 10"  (1.778 m)   Wt 239 lb (108.4 kg)   SpO2 94%   BMI 34.29 kg/m     Wt Readings from Last 3 Encounters:  07/11/20 239 lb (108.4  kg)  06/21/20 232 lb 15.7 oz (105.7 kg)  06/12/20 233 lb (105.7 kg)     GEN: He appears much healthier well nourished, well developed in no acute distress HEENT: Normal NECK: No JVD; No carotid bruits LYMPHATICS: No lymphadenopathy CARDIAC: RRR, no murmurs, rubs, gallops RESPIRATORY:  Clear to auscultation without rales, wheezing or rhonchi  ABDOMEN: Soft, non-tender, non-distended MUSCULOSKELETAL:  No edema; No deformity  SKIN: Warm and dry NEUROLOGIC:  Alert and oriented x 3 PSYCHIATRIC:  Normal affect    Signed, Louis More, MD  07/11/2020 8:22 AM    [facility]

## 2020-07-11 ENCOUNTER — Other Ambulatory Visit: Payer: Self-pay

## 2020-07-11 ENCOUNTER — Ambulatory Visit: Payer: Medicare PPO | Admitting: Cardiology

## 2020-07-11 ENCOUNTER — Encounter: Payer: Self-pay | Admitting: Cardiology

## 2020-07-11 VITALS — BP 142/64 | HR 63 | Ht 70.0 in | Wt 239.0 lb

## 2020-07-11 DIAGNOSIS — I480 Paroxysmal atrial fibrillation: Secondary | ICD-10-CM | POA: Diagnosis not present

## 2020-07-11 DIAGNOSIS — Z79899 Other long term (current) drug therapy: Secondary | ICD-10-CM | POA: Diagnosis not present

## 2020-07-11 DIAGNOSIS — I495 Sick sinus syndrome: Secondary | ICD-10-CM | POA: Diagnosis not present

## 2020-07-11 DIAGNOSIS — E782 Mixed hyperlipidemia: Secondary | ICD-10-CM

## 2020-07-11 DIAGNOSIS — Z950 Presence of cardiac pacemaker: Secondary | ICD-10-CM | POA: Diagnosis not present

## 2020-07-11 DIAGNOSIS — N184 Chronic kidney disease, stage 4 (severe): Secondary | ICD-10-CM

## 2020-07-11 DIAGNOSIS — Z7901 Long term (current) use of anticoagulants: Secondary | ICD-10-CM

## 2020-07-11 DIAGNOSIS — I110 Hypertensive heart disease with heart failure: Secondary | ICD-10-CM

## 2020-07-11 MED ORDER — AMIODARONE HCL 200 MG PO TABS: 200.0000 mg | ORAL_TABLET | Freq: Two times a day (BID) | ORAL | 3 refills | Status: DC

## 2020-07-11 NOTE — Patient Instructions (Addendum)
Medication Instructions:  Your physician has recommended you make the following change in your medication:  START: Amiodarone 200 mg take one tablet by mouth twice daily for one month. Then take one tablet by mouth daily.  *If you need a refill on your cardiac medications before your next appointment, please call your pharmacy*   Lab Work: None If you have labs (blood work) drawn today and your tests are completely normal, you will receive your results only by: Marland Kitchen MyChart Message (if you have MyChart) OR . A paper copy in the mail If you have any lab test that is abnormal or we need to change your treatment, we will call you to review the results.   Testing/Procedures: None   Follow-Up: At [facility], you and your health needs are our priority.  As part of our continuing mission to provide you with exceptional heart care, we have created designated Provider Care Teams.  These Care Teams include your primary Cardiologist (physician) and Advanced Practice Providers (APPs -  Physician Assistants and Nurse Practitioners) who all work together to provide you with the care you need, when you need it.  We recommend signing up for the patient portal called "MyChart".  Sign up information is provided on this After Visit Summary.  MyChart is used to connect with patients for Virtual Visits (Telemedicine).  Patients are able to view lab/test results, encounter notes, upcoming appointments, etc.  Non-urgent messages can be sent to your provider as well.   To learn more about what you can do with MyChart, go to [email or web address].    Your next appointment:   6 month(s)  The format for your next appointment:   In Person  Provider:   Shirlee More, MD   Other Instructions

## 2020-08-03 IMAGING — CR DG CHEST 2V
2 series · 2 of 2 positions shown · non-contrast
Comparison: None.

CLINICAL DATA: Postop from pacemaker placement.

EXAM:
CHEST - 2 VIEW

[chest lat]
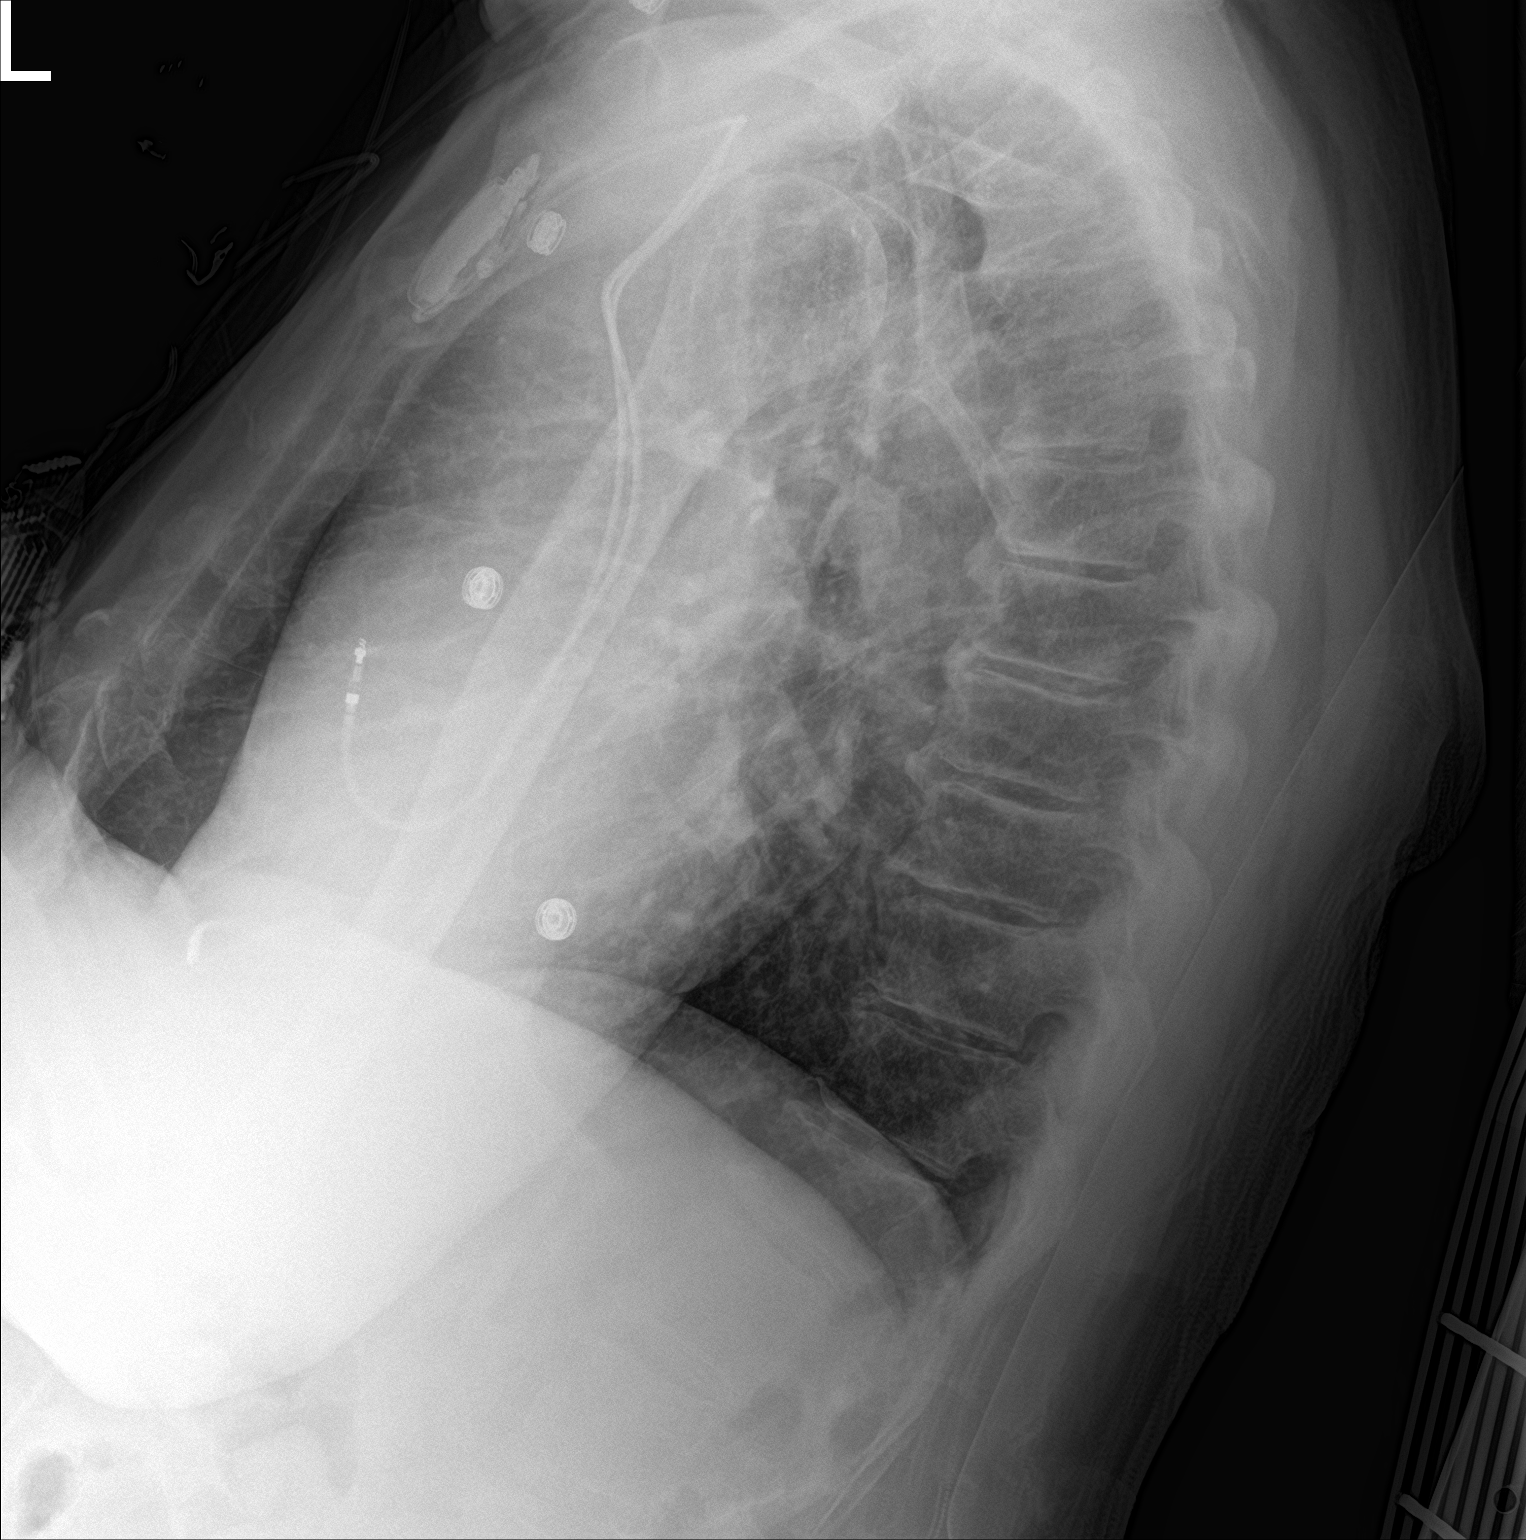

[chest ap]
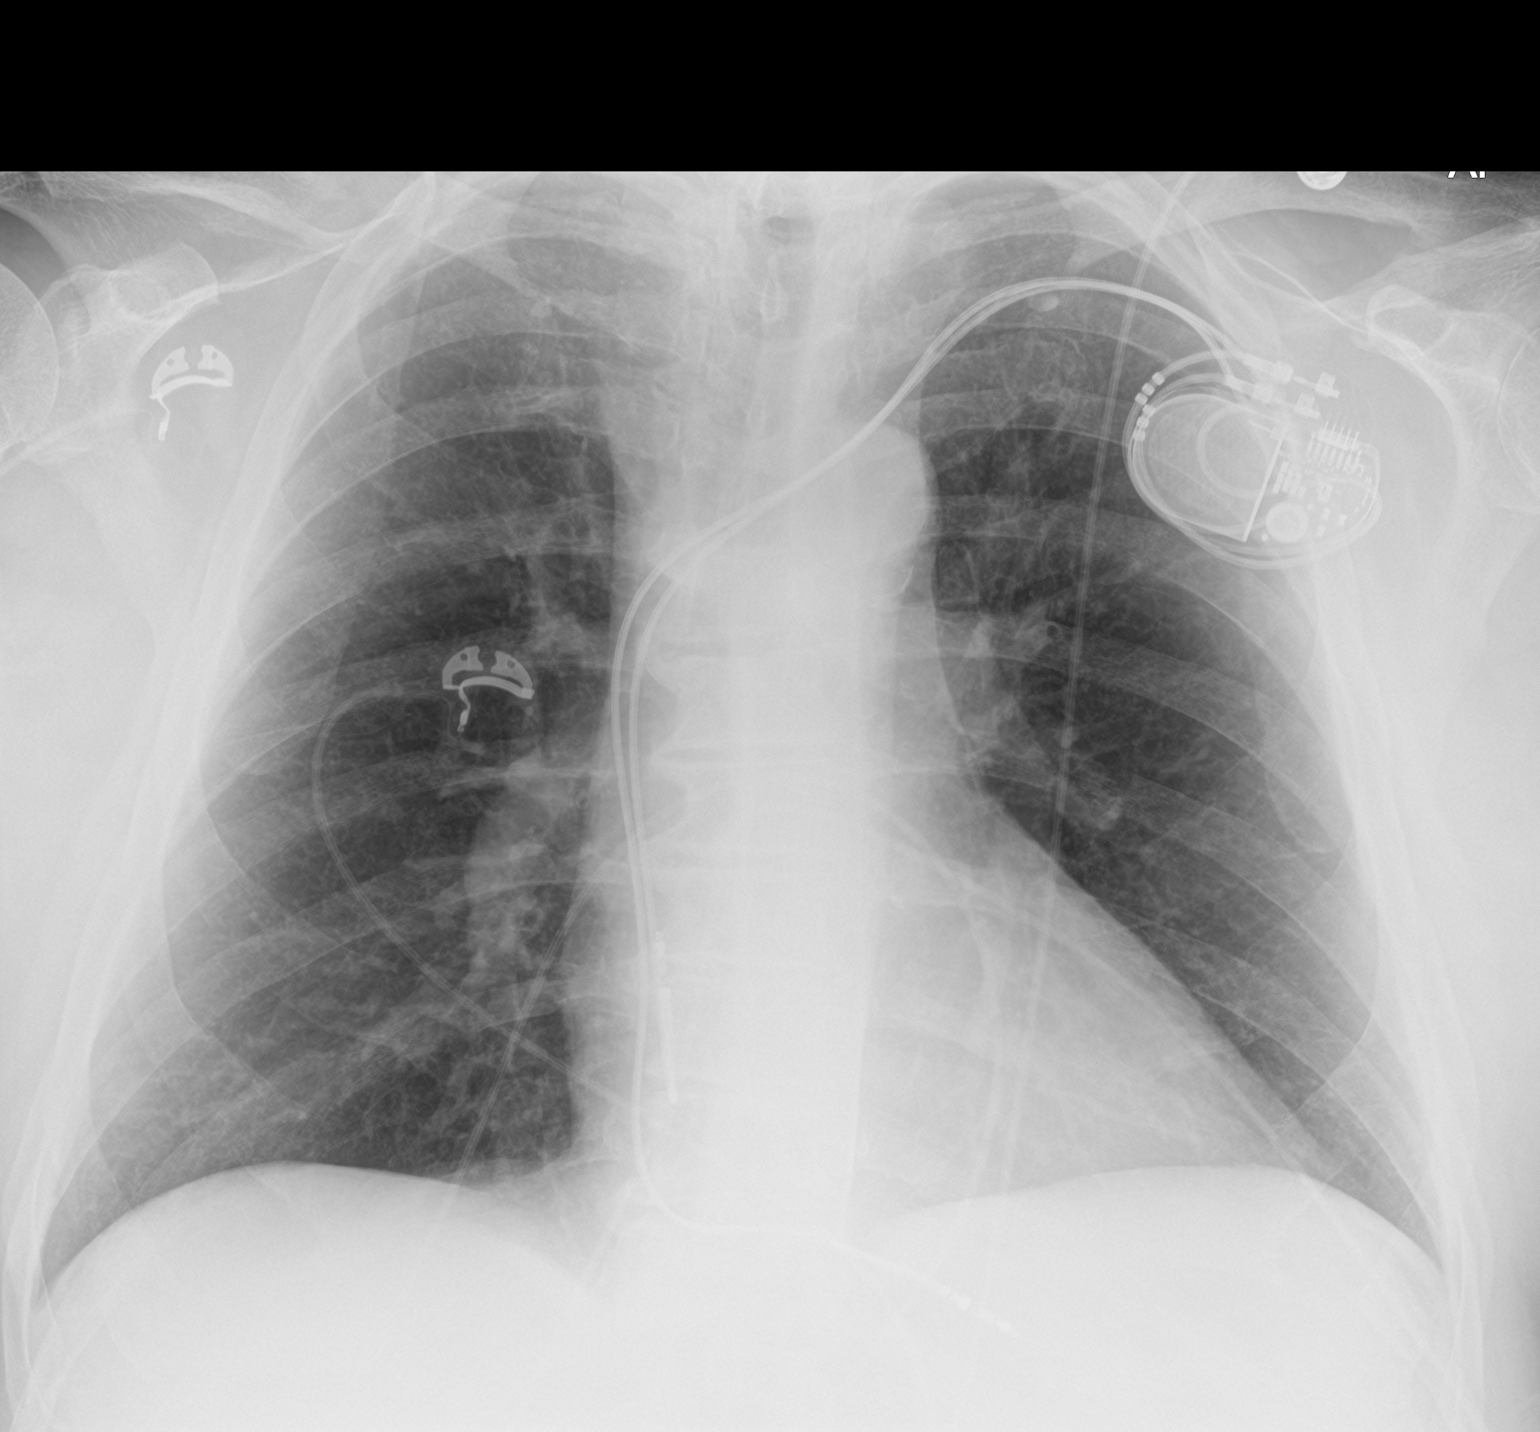

[2 of 2 positions shown; findings below may reference images not displayed]

FINDINGS: The heart size and mediastinal contours are within normal limits.
New dual lead transvenous pacemaker is seen in appropriate position.
No pneumothorax visualized. Both lungs are clear. Old right clavicle
fracture deformity is again noted.
IMPRESSION: New pacemaker in appropriate position. No evidence of pneumothorax
or other acute findings.

## 2020-08-09 ENCOUNTER — Ambulatory Visit (INDEPENDENT_AMBULATORY_CARE_PROVIDER_SITE_OTHER): Payer: Medicare PPO

## 2020-08-09 DIAGNOSIS — I495 Sick sinus syndrome: Secondary | ICD-10-CM

## 2020-08-11 LAB — CUP PACEART REMOTE DEVICE CHECK
Battery Remaining Longevity: 100 mo
Battery Remaining Percentage: 95.5 %
Battery Voltage: 2.99 V
Brady Statistic AP VP Percent: 58 %
Brady Statistic AP VS Percent: 1 %
Brady Statistic AS VP Percent: 41 %
Brady Statistic AS VS Percent: 1 %
Brady Statistic RA Percent Paced: 48 %
Brady Statistic RV Percent Paced: 99 %
Date Time Interrogation Session: 20220107025204
Implantable Lead Implant Date: 20200930
Implantable Lead Implant Date: 20200930
Implantable Lead Location: 753859
Implantable Lead Location: 753860
Implantable Pulse Generator Implant Date: 20200930
Lead Channel Impedance Value: 380 Ohm
Lead Channel Impedance Value: 550 Ohm
Lead Channel Pacing Threshold Amplitude: 0.75 V
Lead Channel Pacing Threshold Amplitude: 1.25 V
Lead Channel Pacing Threshold Pulse Width: 0.5 ms
Lead Channel Pacing Threshold Pulse Width: 0.5 ms
Lead Channel Sensing Intrinsic Amplitude: 4.7 mV
Lead Channel Sensing Intrinsic Amplitude: 5.8 mV
Lead Channel Setting Pacing Amplitude: 2 V
Lead Channel Setting Pacing Amplitude: 2.5 V
Lead Channel Setting Pacing Pulse Width: 0.5 ms
Lead Channel Setting Sensing Sensitivity: 2 mV
Pulse Gen Model: 2272
Pulse Gen Serial Number: 9159064

## 2020-08-14 ENCOUNTER — Other Ambulatory Visit: Payer: Self-pay | Admitting: Cardiology

## 2020-08-23 NOTE — Progress Notes (Signed)
Remote pacemaker transmission.   

## 2020-09-09 ENCOUNTER — Encounter: Payer: Self-pay | Admitting: Cardiology

## 2020-09-09 ENCOUNTER — Other Ambulatory Visit: Payer: Self-pay

## 2020-09-09 ENCOUNTER — Ambulatory Visit (INDEPENDENT_AMBULATORY_CARE_PROVIDER_SITE_OTHER): Payer: Medicare PPO | Admitting: Cardiology

## 2020-09-09 VITALS — BP 138/70 | HR 63 | Ht 70.0 in | Wt 240.8 lb

## 2020-09-09 DIAGNOSIS — I495 Sick sinus syndrome: Secondary | ICD-10-CM

## 2020-09-09 DIAGNOSIS — Z950 Presence of cardiac pacemaker: Secondary | ICD-10-CM | POA: Diagnosis not present

## 2020-09-09 LAB — CUP PACEART INCLINIC DEVICE CHECK
Battery Remaining Longevity: 97 mo
Battery Voltage: 3.01 V
Brady Statistic RA Percent Paced: 58 %
Brady Statistic RV Percent Paced: 99.93 %
Date Time Interrogation Session: 20220207092900
Implantable Lead Implant Date: 20200930
Implantable Lead Implant Date: 20200930
Implantable Lead Location: 753859
Implantable Lead Location: 753860
Implantable Pulse Generator Implant Date: 20200930
Lead Channel Impedance Value: 400 Ohm
Lead Channel Impedance Value: 600 Ohm
Lead Channel Pacing Threshold Amplitude: 1 V
Lead Channel Pacing Threshold Amplitude: 1.5 V
Lead Channel Pacing Threshold Pulse Width: 0.5 ms
Lead Channel Pacing Threshold Pulse Width: 0.5 ms
Lead Channel Sensing Intrinsic Amplitude: 5 mV
Lead Channel Sensing Intrinsic Amplitude: 5.8 mV
Lead Channel Setting Pacing Amplitude: 2 V
Lead Channel Setting Pacing Amplitude: 2.5 V
Lead Channel Setting Pacing Pulse Width: 0.5 ms
Lead Channel Setting Sensing Sensitivity: 2 mV
Pulse Gen Model: 2272
Pulse Gen Serial Number: 9159064

## 2020-09-09 MED ORDER — CARVEDILOL 25 MG PO TABS: 25.0000 mg | ORAL_TABLET | Freq: Two times a day (BID) | ORAL | 3 refills | Status: DC

## 2020-09-09 NOTE — Progress Notes (Signed)
Electrophysiology Office Note   Date:  09/09/2020   ID:  Louis Neal, Louis Neal August 08, 1950, MRN [id number]  PCP:  Algis Greenhouse, MD  Cardiologist: Bettina Gavia Primary Electrophysiologist:  Devynne Sturdivant Meredith Leeds, MD    No chief complaint on file.    History of Present Illness: Louis Neal is a 70 y.o. male who is being seen today for the evaluation of atrial fibrillation at the request of Dough, Jaymes Graff, MD. Presenting today for electrophysiology evaluation.  He has a history significant for diabetes, hyperlipidemia, hypertension, CKD stage IV, and atrial fibrillation.  His heart rates have been slow in atrial fibrillation associated with weakness and fatigue.  He also had bradycardia in sinus rhythm.  He is status post Abbott dual-chamber pacemaker implanted 05/03/2019.  Today, denies symptoms of palpitations, chest pain, shortness of breath, orthopnea, PND, lower extremity edema, claudication, dizziness, presyncope, syncope, bleeding, or neurologic sequela. The patient is tolerating medications without difficulties. Since last being seen he has done well. He has no chest pain or shortness of breath. He did require cardioversion 06/21/2020 for his atrial fibrillation. He has since been started back on amiodarone.   Past Medical History:  Diagnosis Date  . Anemia due to stage 4 chronic kidney disease ([place]) 05/11/2019   Formatting of this note might be different from the original. Updated from 03/08/2019 [facility] Nephrology VN  . Arthritis   . Arthropathy of lumbar facet joint 07/22/2012  . 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 Benign hypertension with CKD (chronic kidney disease) stage IV ([place]) 05/11/2019   Formatting of this note might be different from the original. Updated per 03/08/2019 [facility] Nephrology VN  . Bilateral lower extremity edema 02/28/2016  . Cardiorenal syndrome with renal failure 11/14/2018  . Chronic anticoagulation 08/31/2017  . CKD (chronic kidney disease) stage 3, GFR 30-59 ml/min (H[facility])  11/30/2017  . Degeneration of intervertebral disc of lumbar region 07/22/2012  . Degenerative disc disease, lumbar 07/22/2012  . Diabetes mellitus without complication ([place])   . Diabetic ulcer of right fifth toe ([place]) 09/18/2019  . Diaphragmatic hernia 09/04/2016  . Elevated brain natriuretic peptide (BNP) level 10/26/2017  . Essential hypertension 01/14/2016   Formatting of this note might be different from the original. Managed CARDS  . Facet arthropathy, lumbar 07/22/2012  . 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 2021: EGD  . Gastroesophageal reflux disease without esophagitis 11/28/2015   Overview:  Managed GI  Formatting of this note might be different from the original. Managed GI  . GERD (gastroesophageal reflux disease)   . Glaucoma 02/28/2016  . High risk medication use 01/14/2016  . History of diabetic ulcer of foot 06/29/2017  . Hyperkalemia 03/08/2018  . Hyperlipidemia, mixed 02/28/2016  . Hypertension    dr Bettina Gavia  in [place]  . Hypertensive heart disease with heart failure ([place]) 01/14/2016  . Hypotension arterial 01/14/2016  . Low back pain 07/22/2012  . Lumbosacral radiculopathy 07/22/2012  . Metabolic bone disease [id number]  . Mobitz type 1 second degree atrioventricular block 10/14/2018  . Obstructive sleep apnea 02/28/2016  . Obstructive sleep apnea on CPAP 02/28/2016   Formatting of this note might be different from the original. Managed PULM 2019: CPAP, new  . Olecranon bursitis 01/13/2017   Overview:  2018: left  . On amiodarone therapy 10/14/2018  . Onychomycosis 10/03/2019  . Orthostatic hypotension 01/14/2016  . PAF (paroxysmal atrial fibrillation) ([place]) 03/02/2017  . Persistent proteinuria 12/15/2017  .  Pre-ulcerative calluses 10/17/2015  . Renal artery stenosis ([place]) 02/28/2016  . SSS (sick sinus syndrome) ([place]) 04/02/2017  . Stucco keratoses 06/23/2016  . Tachycardia-bradycardia syndrome ([place]) 04/02/2017  .  Type 2 diabetes mellitus, with long-term current use of insulin ([place]) 02/28/2016   Formatting of this note might be different from the original. Per 8/5//2020 [facility] Nephrology VN  . Ulcer of foot, right, limited to breakdown of skin ([place]) 03/29/2018  . Ulcer of right foot, with fat layer exposed ([place]) 08/22/2019  . Vitamin D deficiency 12/15/2017  . Wellness examination 07/08/2018   Past Surgical History:  Procedure Laterality Date  . CARDIOVERSION N/A 06/21/2020   Procedure: CARDIOVERSION;  Surgeon: Sanda Klein, MD;  Location: [facility];  Service: Cardiovascular;  Laterality: N/A;  . LUMBAR LAMINECTOMY/DECOMPRESSION MICRODISCECTOMY Bilateral 09/22/2012   Procedure: LUMBAR LAMINECTOMY/DECOMPRESSION MICRODISCECTOMY 1 LEVEL;  Surgeon: Ophelia Charter, MD;  Location: [facility]O [facility]S;  Service: Neurosurgery;  Laterality: Bilateral;  Lumbar two-three laminectomy  . PACEMAKER IMPLANT N/A 05/03/2019   Procedure: PACEMAKER IMPLANT;  Surgeon: Constance Haw, MD;  Location: [facility] CV LAB;  Service: Cardiovascular;  Laterality: N/A;     Current Outpatient Medications  Medication Sig Dispense Refill  . amiodarone (PACERONE) 200 MG tablet Take 200 mg by mouth daily in the afternoon.    Marland Kitchen ammonium lactate (LAC-HYDRIN) 12 % lotion Apply 1 application topically 2 (two) times daily. Applied to feet  2  . carvedilol (COREG) 25 MG tablet Take 1 tablet (25 mg total) by mouth 2 (two) times daily. 180 tablet 3  . Cholecalciferol (VITAMIN D) 2000 units tablet Take 2,000 Units by mouth daily with breakfast.     . colesevelam (WELCHOL) 625 MG tablet Take 1,875 mg by mouth 2 (two) times daily with a meal.     . Dulaglutide 3 MG/0.5ML SOPN Inject 0.5 mLs into the skin every 7 (seven) days.    Marland Kitchen ELIQUIS 5 MG TABS tablet TAKE ONE TABLET BY MOUTH TWICE DAILY 180 tablet 1  . esomeprazole (NEXIUM) 40 MG capsule Take 40 mg by mouth daily before breakfast.   6  . ferrous sulfate 325 (65 FE) MG tablet Take 325 mg  by mouth daily with breakfast.    . glucose blood test strip CHECK BLOOD SUGAR FOUR TIMES DAILY    . hydrALAZINE (APRESOLINE) 100 MG tablet Take 100 mg by mouth 3 (three) times daily.   6  . insulin aspart (NOVOLOG) 100 UNIT/ML FlexPen Inject 30 Units into the skin daily with supper.     . insulin glargine, 1 Unit Dial, (TOUJEO SOLOSTAR) 300 UNIT/ML Solostar Pen Inject 60 Units into the skin daily.    Marland Kitchen latanoprost (XALATAN) 0.005 % ophthalmic solution Place 1 drop into both eyes at bedtime.     Marland Kitchen MITIGARE 0.6 MG CAPS Take 0.6 mg by mouth daily.     . mupirocin ointment (BACTROBAN) 2 % Apply 1 application topically 2 (two) times daily as needed for wound care.    . omega-3 acid ethyl esters (LOVAZA) 1 g capsule Take 2 g by mouth 2 (two) times daily.    . rosuvastatin (CRESTOR) 10 MG tablet Take 10 mg by mouth daily with supper.     . torsemide (DEMADEX) 20 MG tablet TAKE ONE TABLET BY MOUTH TWICE DAILY (Patient taking differently: Take 20 mg by mouth 2 (two) times daily.) 180 tablet 2  . UNIFINE PENTIPS 31G X 8 MM MISC     . vitamin B-12 (CYANOCOBALAMIN) 1000 MCG  tablet Take 1,000 mcg by mouth daily.     No current facility-administered medications for this visit.    Allergies:   Amlodipine besylate, Atorvastatin, and Ezetimibe   Social History:  The patient  reports that he has quit smoking. His smoking use included cigarettes and cigars. He has a 10.00 pack-year smoking history. He has never used smokeless tobacco. He reports that he does not drink alcohol and does not use drugs.   Family History:  The patient's family history includes Cancer in his mother; Liver cancer in his father.   ROS:  Please see the history of present illness.   Otherwise, review of systems is positive for none.   All other systems are reviewed and negative.   PHYSICAL EXAM: VS:  BP 138/70   Pulse 63   Ht '5\' 10"'$  (1.778 m)   Wt 240 lb 12.8 oz (109.2 kg)   SpO2 96%   BMI 34.55 kg/m  , BMI Body mass index is  34.55 kg/m. GEN: Well nourished, well developed, in no acute distress  HEENT: normal  Neck: no JVD, carotid bruits, or masses Cardiac: RRR; no murmurs, rubs, or gallops,no edema  Respiratory:  clear to auscultation bilaterally, normal work of breathing GI: soft, nontender, nondistended, + BS MS: no deformity or atrophy  Skin: warm and dry, device site well healed Neuro:  Strength and sensation are intact Psych: euthymic mood, full affect  EKG:  EKG is ordered today. Personal review of the ekg ordered shows sinus rhythm, ventricular paced  Personal review of the device interrogation today. Results in [place]: 06/12/2020: BUN 43; Creatinine, Ser 2.23; Hemoglobin 13.2; Platelets 199; Potassium 4.2; Sodium 140; TSH 0.747    Lipid Panel  No results found for: CHOL, TRIG, HDL, CHOLHDL, VLDL, LDLCALC, LDLDIRECT   Wt Readings from Last 3 Encounters:  09/09/20 240 lb 12.8 oz (109.2 kg)  07/11/20 239 lb (108.4 kg)  06/21/20 232 lb 15.7 oz (105.7 kg)      Other studies Reviewed: Additional studies/ records that were reviewed today include: TTE 11/26/2017 Review of the above records today demonstrates:  - Left ventricle: The cavity size was normal. There was mild   concentric hypertrophy. Systolic function was normal. Wall motion   was normal; there were no regional wall motion abnormalities. The   study is not technically sufficient to allow evaluation of LV   diastolic function. - Mitral valve: There was moderate regurgitation. - Left atrium: The atrium was moderately dilated.  Cardiac monitor 09/22/2018 personally reviewed Conclusion paroxysmal atrial fibrillation with a relatively slow response, Mobitz 1 second-degree AV block.  ASSESSMENT AND PLAN:  1.  Paroxysmal atrial fibrillation: Currently on Eliquis and amiodarone.  CHA2DS2-VASc of at least 3.  High risk medication monitoring. He continues to feel well. He has no chest pain or shortness of breath. He was  recently started back on amiodarone.  2.  Hypertension: Blood pressure is elevated today and elevated at home. Clinton Wahlberg increase carvedilol to 25 mg.  3.  CKD stage IV: Has follow-up with neurology at [facility].  4.  Sick sinus syndrome: Status post Abbott dual-chamber pacemaker implanted 05/03/2019.  Device functioning appropriately.  No changes.     Current medicines are reviewed at length with the patient today.   The patient does not have concerns regarding his medicines.  The following changes were made today: Increase carvedilol  Labs/ tests ordered today include:  Orders Placed This Encounter  Procedures  . EKG 12-Lead  Disposition:   FU with Jesten Cappuccio 12 months  Signed, Marco Adelson Meredith Leeds, MD  09/09/2020 8:59 AM     [facility] [address] [place] [address] [phone] (office) [phone] (fax)

## 2020-09-09 NOTE — Patient Instructions (Signed)
Medication Instructions:  Your physician has recommended you make the following change in your medication:  1. INCREASE Carvedilol to 25 mg twice a day  *If you need a refill on your cardiac medications before your next appointment, please call your pharmacy*   Lab Work: None ordered   Testing/Procedures: None ordered   Follow-Up: At [facility], you and your health needs are our priority.  As part of our continuing mission to provide you with exceptional heart care, we have created designated Provider Care Teams.  These Care Teams include your primary Cardiologist (physician) and Advanced Practice Providers (APPs -  Physician Assistants and Nurse Practitioners) who all work together to provide you with the care you need, when you need it.  Your next appointment:   1 year(s)  The format for your next appointment:   In Person  Provider:   Allegra Lai, MD    Thank you for choosing [facility]!!   Trinidad Curet, RN [phone]   Other Instructions

## 2020-09-17 ENCOUNTER — Telehealth: Payer: Self-pay | Admitting: Cardiology

## 2020-09-17 NOTE — Telephone Encounter (Signed)
Pt c/o medication issue:  1. Name of Medication: amiodarone (PACERONE) 200 MG tablet  2. How are you currently taking this medication (dosage and times per day)? 1 tablet daily  3. Are you having a reaction (difficulty breathing--STAT)? no  4. What is your medication issue? Mel Almond from [facility] states the newest prescription for the medication says twice a day, but the patient states he is supposed to take it once a day. She states they need this prescription sent to them.

## 2020-09-18 MED ORDER — AMIODARONE HCL 200 MG PO TABS
200.0000 mg | ORAL_TABLET | Freq: Every day | ORAL | 3 refills | Status: DC
Start: 2020-09-18 — End: 2020-11-18

## 2020-09-18 NOTE — Telephone Encounter (Signed)
Called and spoke to patient. He was started back on amio 200 mg BID at his visit with Dr. Bettina Gavia on 07/11/20 with instructions to decrease to QD after a month. He was taking amio 200 mg QD at his last visit with Dr. Curt Bears. Patient denies having any complaints at this time. Updated RX for amio 200 mg QD.

## 2020-11-08 ENCOUNTER — Ambulatory Visit (INDEPENDENT_AMBULATORY_CARE_PROVIDER_SITE_OTHER): Payer: Medicare PPO

## 2020-11-08 DIAGNOSIS — I495 Sick sinus syndrome: Secondary | ICD-10-CM

## 2020-11-08 LAB — CUP PACEART REMOTE DEVICE CHECK
Battery Remaining Longevity: 99 mo
Battery Remaining Percentage: 95.5 %
Battery Voltage: 2.99 V
Brady Statistic AP VP Percent: 72 %
Brady Statistic AP VS Percent: 1 %
Brady Statistic AS VP Percent: 28 %
Brady Statistic AS VS Percent: 1 %
Brady Statistic RA Percent Paced: 72 %
Brady Statistic RV Percent Paced: 99 %
Date Time Interrogation Session: 20220408020014
Implantable Lead Implant Date: 20200930
Implantable Lead Implant Date: 20200930
Implantable Lead Location: 753859
Implantable Lead Location: 753860
Implantable Pulse Generator Implant Date: 20200930
Lead Channel Impedance Value: 380 Ohm
Lead Channel Impedance Value: 490 Ohm
Lead Channel Pacing Threshold Amplitude: 1 V
Lead Channel Pacing Threshold Amplitude: 1.5 V
Lead Channel Pacing Threshold Pulse Width: 0.5 ms
Lead Channel Pacing Threshold Pulse Width: 0.5 ms
Lead Channel Sensing Intrinsic Amplitude: 5 mV
Lead Channel Sensing Intrinsic Amplitude: 5.8 mV
Lead Channel Setting Pacing Amplitude: 2 V
Lead Channel Setting Pacing Amplitude: 2.5 V
Lead Channel Setting Pacing Pulse Width: 0.5 ms
Lead Channel Setting Sensing Sensitivity: 2 mV
Pulse Gen Model: 2272
Pulse Gen Serial Number: 9159064

## 2020-11-18 ENCOUNTER — Other Ambulatory Visit: Payer: Self-pay | Admitting: Cardiology

## 2020-11-22 NOTE — Progress Notes (Signed)
Remote pacemaker transmission.   

## 2021-01-07 NOTE — Progress Notes (Signed)
Cardiology Office Note:    Date:  01/08/2021   ID:  Louis Neal, DOB [birth year], MRN [id number]  PCP:  Algis Greenhouse, MD  Cardiologist:  Shirlee More, MD    Referring MD: Algis Greenhouse, MD please do CMP TSH free T3 free T4 every 6 months on amiodarone.   ASSESSMENT:    1. SSS (sick sinus syndrome) ([place])   2. Pacemaker   3. Paroxysmal atrial fibrillation (HCC)   4. On amiodarone therapy   5. Chronic anticoagulation   6. Hypertensive heart disease with heart failure ([place])   7. Chronic kidney disease (CKD), stage IV (severe) ([place])   8. Hyperlipidemia, mixed    PLAN:    In order of problems listed above:  1. Louis Neal is doing much better since pacemaker insertion and conversion to sinus rhythm.  Normal pacemaker function followed in our program and has had no breakthrough episodes of atrial fibrillation 2. Louis Neal is maintaining sinus rhythm and will continue low-dose amiodarone I will ask his PCP to be sure Louis Neal gets a CMP TSH T3-T4 every 6 months on amiodarone and Louis Neal will continue his current anticoagulant. 3. BP is at target.  Louis Neal takes a multi drug regimen including high-dose hydralazine carvedilol and his loop diuretic CKD is followed by nephrology. 4. Ideal lipids continue with statin   Next appointment: 6 months we will continue to follow in device clinic   Medication Adjustments/Labs and Tests Ordered: Current medicines are reviewed at length with the patient today.  Concerns regarding medicines are outlined above.  Orders Placed This Encounter  Procedures  . EKG 12-Lead   No orders of the defined types were placed in this encounter.   Chief Complaint  Patient presents with  . Follow-up    History of Present Illness:    Louis Neal is a 70 y.o. male with a hx of sick sinus syndrome, paroxysmal atrial fibrillation suppressed with amiodarone therapy, permanent dual-chamber pacemaker, chronic anticoagulation, hypertensive heart disease with heart failure, stage IV  CKD and hyper lipidemia.  Louis Neal was last seen 07/11/2020 after cardioversion to sinus rhythm 06/21/2020.  Compliance with diet, lifestyle and medications: Yes  Is pleased with the quality of his life. His weight is stable and blood pressure generally runs less than 1 [id number] systolic. Louis Neal has had no palpitation edema shortness of breath chest pain or syncope.  Louis Neal tolerates his anticoagulant without bleeding complication and his statin without muscle pain or weakness. Louis Neal is due for nephrology follow-up next week and soon after visit with his PCP. Louis Neal will need to have a CMP and thyroid studies every 6 months on amiodarone  Recent labs 01/06/2021 Creatinine 2.09 stable GFR 33 cc potassium 4.0 sodium 139 Cholesterol 92 LDL 29 HDL 35 non-HDL cholesterol 57 Hemoglobin 13.2  Remote device check 11/08/2020 showed normal pacemaker device function projected battery life 99 months ventricularly paced 29% of the time atrially paced 73% of the time of the time Past Medical History:  Diagnosis Date  . Anemia due to stage 4 chronic kidney disease ([place]) 05/11/2019   Formatting of this note might be different from the original. Updated from 03/08/2019 [facility] Nephrology VN  . Arthritis   . Arthropathy of lumbar facet joint 07/22/2012  . 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 Benign hypertension with CKD (chronic kidney disease) stage IV ([place]) 05/11/2019   Formatting of this note might be different from the original. Updated per 03/08/2019 [facility] Nephrology VN  . Bilateral lower extremity edema 02/28/2016  .  Cardiorenal syndrome with renal failure 11/14/2018  . Chronic anticoagulation 08/31/2017  . CKD (chronic kidney disease) stage 3, GFR 30-59 ml/min (HCC) 11/30/2017  . Degeneration of intervertebral disc of lumbar region 07/22/2012  . Degenerative disc disease, lumbar 07/22/2012  . Diabetes mellitus without complication ([place])   . Diabetic ulcer of right fifth toe ([place]) 09/18/2019  . Diaphragmatic hernia 09/04/2016  . Elevated  brain natriuretic peptide (BNP) level 10/26/2017  . Essential hypertension 01/14/2016   Formatting of this note might be different from the original. Managed CARDS  . Facet arthropathy, lumbar 07/22/2012  . 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 2021: EGD  . Gastroesophageal reflux disease without esophagitis 11/28/2015   Overview:  Managed GI  Formatting of this note might be different from the original. Managed GI  . GERD (gastroesophageal reflux disease)   . Glaucoma 02/28/2016  . High risk medication use 01/14/2016  . History of diabetic ulcer of foot 06/29/2017  . Hyperkalemia 03/08/2018  . Hyperlipidemia, mixed 02/28/2016  . Hypertension    dr Bettina Gavia  in [place]  . Hypertensive heart disease with heart failure ([place]) 01/14/2016  . Hypotension arterial 01/14/2016  . Low back pain 07/22/2012  . Lumbosacral radiculopathy 07/22/2012  . Metabolic bone disease [id number]  . Mobitz type 1 second degree atrioventricular block 10/14/2018  . Obstructive sleep apnea 02/28/2016  . Obstructive sleep apnea on CPAP 02/28/2016   Formatting of this note might be different from the original. Managed PULM 2019: CPAP, new  . Olecranon bursitis 01/13/2017   Overview:  2018: left  . On amiodarone therapy 10/14/2018  . Onychomycosis 10/03/2019  . Orthostatic hypotension 01/14/2016  . PAF (paroxysmal atrial fibrillation) ([place]) 03/02/2017  . Persistent proteinuria 12/15/2017  . Pre-ulcerative calluses 10/17/2015  . Renal artery stenosis ([place]) 02/28/2016  . SSS (sick sinus syndrome) ([place]) 04/02/2017  . Stucco keratoses 06/23/2016  . Tachycardia-bradycardia syndrome ([place]) 04/02/2017  . Type 2 diabetes mellitus, with long-term current use of insulin ([place]) 02/28/2016   Formatting of this note might be different from the original. Per 8/5//2020 [facility] Nephrology VN  . Ulcer of foot, right, limited to breakdown of skin ([place]) 03/29/2018  . Ulcer of right foot, with  fat layer exposed ([place]) 08/22/2019  . Vitamin D deficiency 12/15/2017  . Wellness examination 07/08/2018    Past Surgical History:  Procedure Laterality Date  . CARDIOVERSION N/A 06/21/2020   Procedure: CARDIOVERSION;  Surgeon: Sanda Klein, MD;  Location: [facility]OSCOPY;  Service: Cardiovascular;  Laterality: N/A;  . LUMBAR LAMINECTOMY/DECOMPRESSION MICRODISCECTOMY Bilateral 09/22/2012   Procedure: LUMBAR LAMINECTOMY/DECOMPRESSION MICRODISCECTOMY 1 LEVEL;  Surgeon: Ophelia Charter, MD;  Location: [facility] NEURO ORS;  Service: Neurosurgery;  Laterality: Bilateral;  Lumbar two-three laminectomy  . PACEMAKER IMPLANT N/A 05/03/2019   Procedure: PACEMAKER IMPLANT;  Surgeon: Constance Haw, MD;  Location: [facility] CV LAB;  Service: Cardiovascular;  Laterality: N/A;    Current Medications: Current Meds  Medication Sig  . amiodarone (PACERONE) 200 MG tablet TAKE ONE TABLET BY MOUTH EVERY DAY  . ammonium lactate (LAC-HYDRIN) 12 % lotion Apply 1 application topically 2 (two) times daily. Applied to feet  . carvedilol (COREG) 25 MG tablet Take 1 tablet (25 mg total) by mouth 2 (two) times daily.  . Cholecalciferol (VITAMIN D) 2000 units tablet Take 2,000 Units by mouth daily with breakfast.   . colesevelam (WELCHOL) 625 MG tablet Take 1,875 mg by mouth 2 (two) times daily with  a meal.   . Dulaglutide 3 MG/0.5ML SOPN Inject 0.5 mLs into the skin every 7 (seven) days.  Marland Kitchen ELIQUIS 5 MG TABS tablet TAKE ONE TABLET BY MOUTH TWICE DAILY  . esomeprazole (NEXIUM) 40 MG capsule Take 40 mg by mouth daily before breakfast.   . ferrous sulfate 325 (65 FE) MG tablet Take 325 mg by mouth daily with breakfast.  . glucose blood test strip CHECK BLOOD SUGAR FOUR TIMES DAILY  . hydrALAZINE (APRESOLINE) 100 MG tablet Take 100 mg by mouth 3 (three) times daily.   . insulin aspart (NOVOLOG) 100 UNIT/ML FlexPen Inject 30 Units into the skin daily with supper.   . insulin glargine, 1 Unit Dial, (TOUJEO SOLOSTAR) 300  UNIT/ML Solostar Pen Inject 60 Units into the skin daily.  Marland Kitchen latanoprost (XALATAN) 0.005 % ophthalmic solution Place 1 drop into both eyes at bedtime.   Marland Kitchen MITIGARE 0.6 MG CAPS Take 0.6 mg by mouth daily.   . mupirocin ointment (BACTROBAN) 2 % Apply 1 application topically 2 (two) times daily as needed for wound care.  . omega-3 acid ethyl esters (LOVAZA) 1 g capsule Take 2 g by mouth 2 (two) times daily.  . rosuvastatin (CRESTOR) 10 MG tablet Take 10 mg by mouth daily with supper.   . torsemide (DEMADEX) 20 MG tablet TAKE ONE TABLET BY MOUTH TWICE DAILY  . UNIFINE PENTIPS 31G X 8 MM MISC   . vitamin B-12 (CYANOCOBALAMIN) 1000 MCG tablet Take 1,000 mcg by mouth daily.     Allergies:   Amlodipine besylate, Atorvastatin, and Ezetimibe   Social History   Socioeconomic History  . Marital status: Married    Spouse name: Not on file  . Number of children: Not on file  . Years of education: Not on file  . Highest education level: Not on file  Occupational History  . Not on file  Tobacco Use  . Smoking status: Former Smoker    Packs/day: 2.00    Years: 5.00    Pack years: 10.00    Types: Cigarettes, Cigars  . Smokeless tobacco: Never Used  . Tobacco comment: quit 10 years ago  Vaping Use  . Vaping Use: Never used  Substance and Sexual Activity  . Alcohol use: No  . Drug use: No  . Sexual activity: Not on file  Other Topics Concern  . Not on file  Social History Narrative  . Not on file   Social Determinants of Health   Financial Resource Strain: Not on file  Food Insecurity: Not on file  Transportation Needs: Not on file  Physical Activity: Not on file  Stress: Not on file  Social Connections: Not on file     Family History: The patient's family history includes Cancer in his mother; Liver cancer in his father. ROS:   Please see the history of present illness.    All other systems reviewed and are negative.  EKGs/Labs/Other Studies Reviewed:    The following studies  were reviewed today:  EKG:  EKG ordered today and personally reviewed.  The ekg ordered today demonstrates 100% atrially and ventricularly paced  Recent Labs: 06/12/2020: BUN 43; Creatinine, Ser 2.23; Hemoglobin 13.2; Platelets 199; Potassium 4.2; Sodium 140; TSH 0.747    Physical Exam:    VS:  BP 138/70 (BP Location: Right Arm, Patient Position: Sitting, Cuff Size: Normal)   Pulse 62   Ht '5\' 10"'$  (1.778 m)   Wt 240 lb (108.9 kg)   SpO2 97%   BMI 34.44  kg/m     Wt Readings from Last 3 Encounters:  01/08/21 240 lb (108.9 kg)  09/09/20 240 lb 12.8 oz (109.2 kg)  07/11/20 239 lb (108.4 kg)     GEN:  Well nourished, well developed in no acute distress HEENT: Normal NECK: No JVD; No carotid bruits LYMPHATICS: No lymphadenopathy CARDIAC: Pacemaker pocket without skin irritation or effusion RRR, no murmurs, rubs, gallops RESPIRATORY:  Clear to auscultation without rales, wheezing or rhonchi  ABDOMEN: Soft, non-tender, non-distended MUSCULOSKELETAL:  No edema; No deformity  SKIN: Warm and dry NEUROLOGIC:  Alert and oriented x 3 PSYCHIATRIC:  Normal affect    Signed, Shirlee More, MD  01/08/2021 8:15 AM    [place]

## 2021-01-08 ENCOUNTER — Encounter: Payer: Self-pay | Admitting: Cardiology

## 2021-01-08 ENCOUNTER — Other Ambulatory Visit: Payer: Self-pay

## 2021-01-08 ENCOUNTER — Ambulatory Visit (INDEPENDENT_AMBULATORY_CARE_PROVIDER_SITE_OTHER): Payer: Medicare PPO | Admitting: Cardiology

## 2021-01-08 VITALS — BP 138/70 | HR 62 | Ht 70.0 in | Wt 240.0 lb

## 2021-01-08 DIAGNOSIS — Z7901 Long term (current) use of anticoagulants: Secondary | ICD-10-CM

## 2021-01-08 DIAGNOSIS — I480 Paroxysmal atrial fibrillation: Secondary | ICD-10-CM

## 2021-01-08 DIAGNOSIS — Z79899 Other long term (current) drug therapy: Secondary | ICD-10-CM

## 2021-01-08 DIAGNOSIS — Z950 Presence of cardiac pacemaker: Secondary | ICD-10-CM | POA: Diagnosis not present

## 2021-01-08 DIAGNOSIS — E782 Mixed hyperlipidemia: Secondary | ICD-10-CM

## 2021-01-08 DIAGNOSIS — I495 Sick sinus syndrome: Secondary | ICD-10-CM | POA: Diagnosis not present

## 2021-01-08 DIAGNOSIS — I110 Hypertensive heart disease with heart failure: Secondary | ICD-10-CM

## 2021-01-08 DIAGNOSIS — N184 Chronic kidney disease, stage 4 (severe): Secondary | ICD-10-CM

## 2021-01-08 NOTE — Addendum Note (Signed)
Addended by: Anselm Pancoast on: 01/08/2021 09:42 AM   Modules accepted: Orders

## 2021-01-08 NOTE — Patient Instructions (Signed)

## 2021-02-01 ENCOUNTER — Other Ambulatory Visit: Payer: Self-pay | Admitting: Cardiology

## 2021-02-04 DIAGNOSIS — N529 Male erectile dysfunction, unspecified: Secondary | ICD-10-CM

## 2021-02-04 HISTORY — DX: Male erectile dysfunction, unspecified: N52.9

## 2021-02-04 NOTE — Telephone Encounter (Signed)
Prescription refill request for Eliquis received. Indication:atrial fib Last office visit:6/22 Scr:2.2 Age: 70 Weight:108.9 kg  Prescription refilled

## 2021-02-07 ENCOUNTER — Ambulatory Visit (INDEPENDENT_AMBULATORY_CARE_PROVIDER_SITE_OTHER): Payer: Medicare PPO

## 2021-02-07 DIAGNOSIS — I495 Sick sinus syndrome: Secondary | ICD-10-CM | POA: Diagnosis not present

## 2021-02-09 LAB — CUP PACEART REMOTE DEVICE CHECK
Battery Remaining Longevity: 76 mo
Battery Remaining Percentage: 78 %
Battery Voltage: 2.99 V
Brady Statistic AP VP Percent: 75 %
Brady Statistic AP VS Percent: 1 %
Brady Statistic AS VP Percent: 25 %
Brady Statistic AS VS Percent: 1 %
Brady Statistic RA Percent Paced: 75 %
Brady Statistic RV Percent Paced: 99 %
Date Time Interrogation Session: 20220708020015
Implantable Lead Implant Date: 20200930
Implantable Lead Implant Date: 20200930
Implantable Lead Location: 753859
Implantable Lead Location: 753860
Implantable Pulse Generator Implant Date: 20200930
Lead Channel Impedance Value: 380 Ohm
Lead Channel Impedance Value: 530 Ohm
Lead Channel Pacing Threshold Amplitude: 1 V
Lead Channel Pacing Threshold Amplitude: 1.5 V
Lead Channel Pacing Threshold Pulse Width: 0.5 ms
Lead Channel Pacing Threshold Pulse Width: 0.5 ms
Lead Channel Sensing Intrinsic Amplitude: 3.8 mV
Lead Channel Sensing Intrinsic Amplitude: 5.8 mV
Lead Channel Setting Pacing Amplitude: 2 V
Lead Channel Setting Pacing Amplitude: 2.5 V
Lead Channel Setting Pacing Pulse Width: 0.5 ms
Lead Channel Setting Sensing Sensitivity: 2 mV
Pulse Gen Model: 2272
Pulse Gen Serial Number: 9159064

## 2021-02-28 NOTE — Progress Notes (Signed)
Remote pacemaker transmission.   

## 2021-05-09 ENCOUNTER — Ambulatory Visit (INDEPENDENT_AMBULATORY_CARE_PROVIDER_SITE_OTHER): Payer: Medicare PPO

## 2021-05-09 DIAGNOSIS — I495 Sick sinus syndrome: Secondary | ICD-10-CM

## 2021-05-13 LAB — CUP PACEART REMOTE DEVICE CHECK
Battery Remaining Longevity: 73 mo
Battery Remaining Percentage: 75 %
Battery Voltage: 2.99 V
Brady Statistic AP VP Percent: 75 %
Brady Statistic AP VS Percent: 1 %
Brady Statistic AS VP Percent: 25 %
Brady Statistic AS VS Percent: 1 %
Brady Statistic RA Percent Paced: 75 %
Brady Statistic RV Percent Paced: 99 %
Date Time Interrogation Session: 20221007020016
Implantable Lead Implant Date: 20200930
Implantable Lead Implant Date: 20200930
Implantable Lead Location: 753859
Implantable Lead Location: 753860
Implantable Pulse Generator Implant Date: 20200930
Lead Channel Impedance Value: 380 Ohm
Lead Channel Impedance Value: 530 Ohm
Lead Channel Pacing Threshold Amplitude: 1 V
Lead Channel Pacing Threshold Amplitude: 1.5 V
Lead Channel Pacing Threshold Pulse Width: 0.5 ms
Lead Channel Pacing Threshold Pulse Width: 0.5 ms
Lead Channel Sensing Intrinsic Amplitude: 4 mV
Lead Channel Sensing Intrinsic Amplitude: 7.2 mV
Lead Channel Setting Pacing Amplitude: 2 V
Lead Channel Setting Pacing Amplitude: 2.5 V
Lead Channel Setting Pacing Pulse Width: 0.5 ms
Lead Channel Setting Sensing Sensitivity: 2 mV
Pulse Gen Model: 2272
Pulse Gen Serial Number: 9159064

## 2021-05-19 NOTE — Progress Notes (Signed)
Remote pacemaker transmission.   

## 2021-07-01 ENCOUNTER — Other Ambulatory Visit: Payer: Self-pay | Admitting: Cardiology

## 2021-08-01 ENCOUNTER — Other Ambulatory Visit: Payer: Self-pay | Admitting: Cardiology

## 2021-08-01 NOTE — Telephone Encounter (Signed)
Prescription refill request for Eliquis received. Indication: afib  Last office visit: [place], 01/08/2021 Scr: 2.09, 07/15/2021 Age: 70 yo  Weight: 108.9 kg   Refill sent.

## 2021-08-08 ENCOUNTER — Ambulatory Visit (INDEPENDENT_AMBULATORY_CARE_PROVIDER_SITE_OTHER): Payer: Medicare PPO

## 2021-08-08 DIAGNOSIS — I495 Sick sinus syndrome: Secondary | ICD-10-CM

## 2021-08-11 LAB — CUP PACEART REMOTE DEVICE CHECK
Battery Remaining Longevity: 71 mo
Battery Remaining Percentage: 72 %
Battery Voltage: 2.99 V
Brady Statistic AP VP Percent: 76 %
Brady Statistic AP VS Percent: 1 %
Brady Statistic AS VP Percent: 24 %
Brady Statistic AS VS Percent: 1 %
Brady Statistic RA Percent Paced: 75 %
Brady Statistic RV Percent Paced: 99 %
Date Time Interrogation Session: 20230106141228
Implantable Lead Implant Date: 20200930
Implantable Lead Implant Date: 20200930
Implantable Lead Location: 753859
Implantable Lead Location: 753860
Implantable Pulse Generator Implant Date: 20200930
Lead Channel Impedance Value: 380 Ohm
Lead Channel Impedance Value: 530 Ohm
Lead Channel Pacing Threshold Amplitude: 1 V
Lead Channel Pacing Threshold Amplitude: 1.5 V
Lead Channel Pacing Threshold Pulse Width: 0.5 ms
Lead Channel Pacing Threshold Pulse Width: 0.5 ms
Lead Channel Sensing Intrinsic Amplitude: 4 mV
Lead Channel Sensing Intrinsic Amplitude: 7.2 mV
Lead Channel Setting Pacing Amplitude: 2 V
Lead Channel Setting Pacing Amplitude: 2.5 V
Lead Channel Setting Pacing Pulse Width: 0.5 ms
Lead Channel Setting Sensing Sensitivity: 2 mV
Pulse Gen Model: 2272
Pulse Gen Serial Number: 9159064

## 2021-08-15 ENCOUNTER — Encounter: Payer: Self-pay | Admitting: Cardiology

## 2021-08-15 ENCOUNTER — Other Ambulatory Visit: Payer: Self-pay

## 2021-08-15 ENCOUNTER — Ambulatory Visit: Payer: Medicare PPO | Admitting: Cardiology

## 2021-08-15 VITALS — BP 122/60 | HR 60 | Ht 70.0 in | Wt 240.0 lb

## 2021-08-15 DIAGNOSIS — I495 Sick sinus syndrome: Secondary | ICD-10-CM

## 2021-08-15 DIAGNOSIS — N184 Chronic kidney disease, stage 4 (severe): Secondary | ICD-10-CM

## 2021-08-15 DIAGNOSIS — Z950 Presence of cardiac pacemaker: Secondary | ICD-10-CM

## 2021-08-15 DIAGNOSIS — Z7901 Long term (current) use of anticoagulants: Secondary | ICD-10-CM

## 2021-08-15 DIAGNOSIS — Z79899 Other long term (current) drug therapy: Secondary | ICD-10-CM

## 2021-08-15 DIAGNOSIS — I480 Paroxysmal atrial fibrillation: Secondary | ICD-10-CM | POA: Diagnosis not present

## 2021-08-15 DIAGNOSIS — I110 Hypertensive heart disease with heart failure: Secondary | ICD-10-CM

## 2021-08-15 NOTE — Patient Instructions (Signed)
Medication Instructions:  Your physician recommends that you continue on your current medications as directed. Please refer to the Current Medication list given to you today.  *If you need a refill on your cardiac medications before your next appointment, please call your pharmacy*   Lab Work: Please have your PCP draw a CMP, TSH, T3, T4 when you go in for your next office visit.  If you have labs (blood work) drawn today and your tests are completely normal, you will receive your results only by: [place] (if you have MyChart) OR A paper copy in the mail If you have any lab test that is abnormal or we need to change your treatment, we will call you to review the results.   Testing/Procedures: None   Follow-Up: At [facility], you and your health needs are our priority.  As part of our continuing mission to provide you with exceptional heart care, we have created designated Provider Care Teams.  These Care Teams include your primary Cardiologist (physician) and Advanced Practice Providers (APPs -  Physician Assistants and Nurse Practitioners) who all work together to provide you with the care you need, when you need it.  We recommend signing up for the patient portal called "MyChart".  Sign up information is provided on this After Visit Summary.  MyChart is used to connect with patients for Virtual Visits (Telemedicine).  Patients are able to view lab/test results, encounter notes, upcoming appointments, etc.  Non-urgent messages can be sent to your provider as well.   To learn more about what you can do with MyChart, go to [email or web address].    Your next appointment:   6 month(s)  The format for your next appointment:   In Person  Provider:   Shirlee More, MD    Other Instructions

## 2021-08-15 NOTE — Progress Notes (Signed)
Cardiology Office Note:    Date:  08/15/2021   ID:  Louis Neal, DOB 03-Aug-1951, MRN [id number]  PCP:  Algis Greenhouse, MD  Cardiologist:  Shirlee More, MD    Referring MD: Algis Greenhouse, MD    ASSESSMENT:    1. Paroxysmal atrial fibrillation (HCC)   2. On amiodarone therapy   3. Chronic anticoagulation   4. SSS (sick sinus syndrome) ([place])   5. Pacemaker   6. Hypertensive heart disease with heart failure ([place])   7. Chronic kidney disease (CKD), stage IV (severe) (HCC)    PLAN:    In order of problems listed above:  Louis Neal is doing much better maintaining sinus rhythm on low-dose amiodarone remains anticoagulant without bleeding complication I will ask his PCP to be sure to check thyroids TSH T4 T3 and liver function with his next labs.  He is due in a few months.  We will continue to follow his pacemaker at device clinic Blood pressure well controlled on current multidrug regimen heart failure compensated no edema with his current loop diuretic and continues to follow with the nephrology service with stable to improved CKD   Next appointment: 6 months   Medication Adjustments/Labs and Tests Ordered: Current medicines are reviewed at length with the patient today.  Concerns regarding medicines are outlined above.  Orders Placed This Encounter  Procedures   EKG 12-Lead   No orders of the defined types were placed in this encounter.   Chief Complaint  Patient presents with   Congestive Heart Failure  And pacemaker with atrial fibrillation  History of Present Illness:    Louis Neal is a 71 y.o. male with a hx of sick sinus syndrome permanent dual-chamber pacemaker for bradycardia paroxysmal atrial fibrillation suppressed with amiodarone therapy hypertensive heart disease with heart failure and stage IV CKD followed by [facility] nephrology and hyperlipidemia last seen 01/08/2021.  Compliance with diet, lifestyle and medications: Yes All blood pressures  run less than 130 and 140/80 Weight stable he is lost 20 pounds on serial visits He feels much better no edema shortness of breath chest pain palpitation or syncope I discussed his pacemaker download with him He continues to follow nephrology CKD is stable and actually improved and his last check now stage III Overall marked improvement in quality of his life with his pacemaker therapy  Recent labs 07/15/2021 sodium 139 potassium 4.1 creatinine 2.09 GFR 33 cc cholesterol 103 LDL 44 HDL 37 hemoglobin 12.8  His latest pacemaker download [id number] shows normal device and lead parameters battery life projected 71 months.  He is atrially paced 77% of the time and ventricular paced 100% of the time.  He had no detected atrial fibrillation. Past Medical History:  Diagnosis Date   Anemia due to stage 4 chronic kidney disease ([place]) 05/11/2019   Formatting of this note might be different from the original. Updated from 03/08/2019 [facility] Nephrology VN   Arthritis    Arthropathy of lumbar facet joint 07/22/2012   Barrett esophagus 02/28/2016   Benign hypertension with CKD (chronic kidney disease) stage IV ([place]) 05/11/2019   Formatting of this note might be different from the original. Updated per 03/08/2019 [facility] Nephrology VN   Bilateral lower extremity edema 02/28/2016   Cardiorenal syndrome with renal failure 11/14/2018   Chronic anticoagulation 08/31/2017   CKD (chronic kidney disease) stage 3, GFR 30-59 ml/min (HCC) 11/30/2017   Degeneration of intervertebral disc of lumbar region 07/22/2012   Degenerative disc  disease, lumbar 07/22/2012   Diabetes mellitus without complication ([place])    Diabetic ulcer of right fifth toe ([place]) 09/18/2019   Diaphragmatic hernia 09/04/2016   Elevated brain natriuretic peptide (BNP) level 10/26/2017   Essential hypertension 01/14/2016   Formatting of this note might be different from the original. Managed CARDS   Facet arthropathy, lumbar 07/22/2012   Gastric antral vascular  ectasia 12/17/2019   Formatting of this note might be different from the original. 2021: EGD   Gastroesophageal reflux disease without esophagitis 11/28/2015   Overview:  Managed GI  Formatting of this note might be different from the original. Managed GI   GERD (gastroesophageal reflux disease)    Glaucoma 02/28/2016   High risk medication use 01/14/2016   History of diabetic ulcer of foot 06/29/2017   Hyperkalemia 03/08/2018   Hyperlipidemia, mixed 02/28/2016   Hypertension    dr Bettina Gavia  in [place]   Hypertensive heart disease with heart failure ([place]) 01/14/2016   Hypotension arterial 01/14/2016   Low back pain 07/22/2012   Lumbosacral radiculopathy 35/00/9381   Metabolic bone disease 03/31/9370   Mobitz type 1 second degree atrioventricular block 10/14/2018   Obstructive sleep apnea 02/28/2016   Obstructive sleep apnea on CPAP 02/28/2016   Formatting of this note might be different from the original. Managed PULM 2019: CPAP, new   Olecranon bursitis 01/13/2017   Overview:  2018: left   On amiodarone therapy 10/14/2018   Onychomycosis 10/03/2019   Orthostatic hypotension 01/14/2016   PAF (paroxysmal atrial fibrillation) ([place]) 03/02/2017   Persistent proteinuria 12/15/2017   Pre-ulcerative calluses 10/17/2015   Renal artery stenosis (HCC) 02/28/2016   SSS (sick sinus syndrome) ([place]) 04/02/2017   Stucco keratoses 06/23/2016   Tachycardia-bradycardia syndrome ([place]) 04/02/2017   Type 2 diabetes mellitus, with long-term current use of insulin ([place]) 02/28/2016   Formatting of this note might be different from the original. Per 8/5//2020 [facility] Nephrology VN   Ulcer of foot, right, limited to breakdown of skin ([place]) 03/29/2018   Ulcer of right foot, with fat layer exposed ([place]) 08/22/2019   Vitamin D deficiency 12/15/2017   Wellness examination 07/08/2018    Past Surgical History:  Procedure Laterality Date   CARDIOVERSION N/A 06/21/2020   Procedure: CARDIOVERSION;  Surgeon: Sanda Klein, MD;  Location:  [facility];  Service: Cardiovascular;  Laterality: N/A;   LUMBAR LAMINECTOMY/DECOMPRESSION MICRODISCECTOMY Bilateral 09/22/2012   Procedure: LUMBAR LAMINECTOMY/DECOMPRESSION MICRODISCECTOMY 1 LEVEL;  Surgeon: Ophelia Charter, MD;  Location: [facility] NEURO ORS;  Service: Neurosurgery;  Laterality: Bilateral;  Lumbar two-three laminectomy   PACEMAKER IMPLANT N/A 05/03/2019   Procedure: PACEMAKER IMPLANT;  Surgeon: Constance Haw, MD;  Location: [place] [facility] CV LAB;  Service: Cardiovascular;  Laterality: N/A;    Current Medications: No outpatient medications have been marked as taking for the 08/15/21 encounter (Office Visit) with Richardo Priest, MD.     Allergies:   Amlodipine besylate, Atorvastatin, and Ezetimibe   Social History   Socioeconomic History   Marital status: Married    Spouse name: Not on file   Number of children: Not on file   Years of education: Not on file   Highest education level: Not on file  Occupational History   Not on file  Tobacco Use   Smoking status: Former    Packs/day: 2.00    Years: 5.00    Pack years: 10.00    Types: Cigarettes, Cigars    Passive exposure: Past   Smokeless tobacco: Never   Tobacco comments:  quit 10 years ago  Vaping Use   Vaping Use: Never used  Substance and Sexual Activity   Alcohol use: No   Drug use: No   Sexual activity: Not on file  Other Topics Concern   Not on file  Social History Narrative   Not on file   Social Determinants of Health   Financial Resource Strain: Not on file  Food Insecurity: Not on file  Transportation Needs: Not on file  Physical Activity: Not on file  Stress: Not on file  Social Connections: Not on file     Family History: The patient's family history includes Cancer in his mother; Liver cancer in his father. ROS:   Please see the history of present illness.    All other systems reviewed and are negative.  EKGs/Labs/Other Studies Reviewed:    The following studies were  reviewed today:  EKG:  EKG ordered today and personally reviewed.  The ekg ordered today demonstrates dual-chamber paced rhythm normal function  Physical Exam:    VS:  BP 122/60 (BP Location: Right Arm)    Pulse 60    Ht 5\' 10"  (1.778 m)    Wt 240 lb (108.9 kg)    SpO2 97%    BMI 34.44 kg/m     Wt Readings from Last 3 Encounters:  08/15/21 240 lb (108.9 kg)  01/08/21 240 lb (108.9 kg)  09/09/20 240 lb 12.8 oz (109.2 kg)     GEN:  Well nourished, well developed in no acute distress HEENT: Normal NECK: No JVD; No carotid bruits LYMPHATICS: No lymphadenopathy CARDIAC: RRR, no murmurs, rubs, gallops RESPIRATORY:  Clear to auscultation without rales, wheezing or rhonchi  ABDOMEN: Soft, non-tender, non-distended MUSCULOSKELETAL:  No edema; No deformity  SKIN: Warm and dry NEUROLOGIC:  Alert and oriented x 3 PSYCHIATRIC:  Normal affect    Signed, Shirlee More, MD  08/15/2021 9:47 AM    [place]

## 2021-08-19 NOTE — Progress Notes (Signed)
Remote pacemaker transmission.   

## 2021-08-30 ENCOUNTER — Other Ambulatory Visit: Payer: Self-pay | Admitting: Cardiology

## 2021-10-17 ENCOUNTER — Telehealth: Payer: Self-pay | Admitting: Cardiology

## 2021-10-17 MED ORDER — CARVEDILOL 25 MG PO TABS: 25.0000 mg | ORAL_TABLET | Freq: Two times a day (BID) | ORAL | 0 refills | Status: DC

## 2021-10-17 NOTE — Telephone Encounter (Signed)
?*  STAT* If patient is at the pharmacy, call can be transferred to refill team. ? ? ?1. Which medications need to be refilled? (please list name of each medication and dose if known) carvedilol (COREG) 25 MG tablet ? ?2. Which pharmacy/location (including street and city if local pharmacy) is medication to be sent to? [facility] ? ?3. Do they need a 30 day or 90 day supply? 90 day  ?

## 2021-10-17 NOTE — Telephone Encounter (Signed)
Pt's medication was sent to pt's pharmacy as requested. Confirmation received.  °

## 2021-11-07 ENCOUNTER — Ambulatory Visit (INDEPENDENT_AMBULATORY_CARE_PROVIDER_SITE_OTHER): Payer: Medicare PPO

## 2021-11-07 DIAGNOSIS — I495 Sick sinus syndrome: Secondary | ICD-10-CM | POA: Diagnosis not present

## 2021-11-10 ENCOUNTER — Ambulatory Visit (INDEPENDENT_AMBULATORY_CARE_PROVIDER_SITE_OTHER): Payer: Medicare PPO | Admitting: Cardiology

## 2021-11-10 ENCOUNTER — Encounter: Payer: Self-pay | Admitting: Cardiology

## 2021-11-10 VITALS — BP 164/62 | HR 63 | Ht 70.0 in | Wt 243.6 lb

## 2021-11-10 DIAGNOSIS — Z01812 Encounter for preprocedural laboratory examination: Secondary | ICD-10-CM

## 2021-11-10 DIAGNOSIS — Z950 Presence of cardiac pacemaker: Secondary | ICD-10-CM

## 2021-11-10 DIAGNOSIS — I480 Paroxysmal atrial fibrillation: Secondary | ICD-10-CM

## 2021-11-10 DIAGNOSIS — E032 Hypothyroidism due to medicaments and other exogenous substances: Secondary | ICD-10-CM

## 2021-11-10 DIAGNOSIS — D6869 Other thrombophilia: Secondary | ICD-10-CM

## 2021-11-10 DIAGNOSIS — Z79899 Other long term (current) drug therapy: Secondary | ICD-10-CM

## 2021-11-10 DIAGNOSIS — I495 Sick sinus syndrome: Secondary | ICD-10-CM | POA: Diagnosis not present

## 2021-11-10 HISTORY — DX: Presence of cardiac pacemaker: Z95.0

## 2021-11-10 LAB — CUP PACEART REMOTE DEVICE CHECK
Battery Remaining Longevity: 67 mo
Battery Remaining Percentage: 69 %
Battery Voltage: 2.99 V
Brady Statistic AP VP Percent: 77 %
Brady Statistic AP VS Percent: 1 %
Brady Statistic AS VP Percent: 23 %
Brady Statistic AS VS Percent: 1 %
Brady Statistic RA Percent Paced: 77 %
Brady Statistic RV Percent Paced: 99 %
Date Time Interrogation Session: 20230407051910
Implantable Lead Implant Date: 20200930
Implantable Lead Implant Date: 20200930
Implantable Lead Location: 753859
Implantable Lead Location: 753860
Implantable Pulse Generator Implant Date: 20200930
Lead Channel Impedance Value: 360 Ohm
Lead Channel Impedance Value: 550 Ohm
Lead Channel Pacing Threshold Amplitude: 1 V
Lead Channel Pacing Threshold Amplitude: 1.5 V
Lead Channel Pacing Threshold Pulse Width: 0.5 ms
Lead Channel Pacing Threshold Pulse Width: 0.5 ms
Lead Channel Sensing Intrinsic Amplitude: 2.8 mV
Lead Channel Sensing Intrinsic Amplitude: 7.2 mV
Lead Channel Setting Pacing Amplitude: 2 V
Lead Channel Setting Pacing Amplitude: 2.5 V
Lead Channel Setting Pacing Pulse Width: 0.5 ms
Lead Channel Setting Sensing Sensitivity: 2 mV
Pulse Gen Model: 2272
Pulse Gen Serial Number: 9159064

## 2021-11-10 NOTE — Progress Notes (Signed)
? ?Electrophysiology Office Note ? ? ?Date:  11/10/2021  ? ?ID:  Louis Neal, DOB 1951/08/01, MRN [id number] ? ?PCP:  Algis Greenhouse, MD  ?Cardiologist: Bettina Gavia ?Primary Electrophysiologist:  Rastus Borton Meredith Leeds, MD   ? ?No chief complaint on file. ? ?  ?History of Present Illness: ?Louis Neal is a 71 y.o. male who is being seen today for the evaluation of atrial fibrillation at the request of Dough, Jaymes Graff, MD. Presenting today for electrophysiology evaluation. ? ?He has a history significant for diabetes, hyperlipidemia, hypertension, CKD stage IV, atrial fibrillation.  His heart rates were slow in atrial fibrillation associated with weakness and fatigue.  He also had bradycardia in sinus rhythm.  He is status post Abbott dual-chamber pacemaker implanted 05/03/2019. ? ?Today, denies symptoms of palpitations, chest pain, shortness of breath, orthopnea, PND, lower extremity edema, claudication, dizziness, presyncope, syncope, bleeding, or neurologic sequela. The patient is tolerating medications without difficulties.  Since being seen he has done well.  He has had no chest pain or shortness of breath.  He is able to all of his daily activities and is quite happy with the way he has been feeling. ? ? ?Past Medical History:  ?Diagnosis Date  ? Anemia due to stage 4 chronic kidney disease ([place]) 05/11/2019  ? Formatting of this note might be different from the original. Updated from 03/08/2019 [facility] Nephrology VN  ? Arthritis   ? Arthropathy of lumbar facet joint 07/22/2012  ? 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 Benign hypertension with CKD (chronic kidney disease) stage IV ([place]) 05/11/2019  ? Formatting of this note might be different from the original. Updated per 03/08/2019 [facility] Nephrology VN  ? Bilateral lower extremity edema 02/28/2016  ? Cardiorenal syndrome with renal failure 11/14/2018  ? Chronic anticoagulation 08/31/2017  ? CKD (chronic kidney disease) stage 3, GFR 30-59 ml/min (HCC) 11/30/2017  ? Degeneration of  intervertebral disc of lumbar region 07/22/2012  ? Degenerative disc disease, lumbar 07/22/2012  ? Diabetes mellitus without complication ([place])   ? Diabetic ulcer of right fifth toe ([place]) 09/18/2019  ? Diaphragmatic hernia 09/04/2016  ? Elevated brain natriuretic peptide (BNP) level 10/26/2017  ? Essential hypertension 01/14/2016  ? Formatting of this note might be different from the original. Managed CARDS  ? Facet arthropathy, lumbar 07/22/2012  ? 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 Formatting of this note might be different from the original. 2021: EGD  ? Gastroesophageal reflux disease without esophagitis 11/28/2015  ? Overview:  Managed GI  Formatting of this note might be different from the original. Managed GI  ? GERD (gastroesophageal reflux disease)   ? Glaucoma 02/28/2016  ? High risk medication use 01/14/2016  ? History of diabetic ulcer of foot 06/29/2017  ? Hyperkalemia 03/08/2018  ? Hyperlipidemia, mixed 02/28/2016  ? Hypertension   ? dr Bettina Gavia  in [place]  ? Hypertensive heart disease with heart failure ([place]) 01/14/2016  ? Hypotension arterial 01/14/2016  ? Low back pain 07/22/2012  ? Lumbosacral radiculopathy 07/22/2012  ? Metabolic bone disease 8/85/0277  ? Mobitz type 1 second degree atrioventricular block 10/14/2018  ? Obstructive sleep apnea 02/28/2016  ? Obstructive sleep apnea on CPAP 02/28/2016  ? Formatting of this note might be different from the original. Managed PULM 2019: CPAP, new  ? Olecranon bursitis 01/13/2017  ? Overview:  2018: left  ? On amiodarone therapy 10/14/2018  ? Onychomycosis 10/03/2019  ? Orthostatic hypotension 01/14/2016  ? PAF (paroxysmal atrial fibrillation) ([place]) 03/02/2017  ?  Persistent proteinuria 12/15/2017  ? Pre-ulcerative calluses 10/17/2015  ? Renal artery stenosis ([place]) 02/28/2016  ? SSS (sick sinus syndrome) ([place]) 04/02/2017  ? Stucco keratoses 06/23/2016  ? Tachycardia-bradycardia syndrome ([place]) 04/02/2017  ? Type 2 diabetes mellitus, with long-term current use of insulin  ([place]) 02/28/2016  ? Formatting of this note might be different from the original. Per 8/5//2020 [facility] Nephrology VN  ? Ulcer of foot, right, limited to breakdown of skin ([place]) 03/29/2018  ? Ulcer of right foot, with fat layer exposed ([place]) 08/22/2019  ? Vitamin D deficiency 12/15/2017  ? Wellness examination 07/08/2018  ? ?Past Surgical History:  ?Procedure Laterality Date  ? CARDIOVERSION N/A 06/21/2020  ? Procedure: CARDIOVERSION;  Surgeon: Sanda Klein, MD;  [facility] ENDOS[facility];  Service: Cardiovascular;  Laterality: N/A;  ? LUMBAR LAMINECTOMY/DECOMPRESSION MICRODISCECTOMY Bilateral 09/22/2012  ? Procedure: LUMBAR LAMINECTOMY/DECOMPRESSION MICRODISCECTOMY 1 LEVEL;  Surgeon: Ophelia Charter, MD;  Location: [place] NEURO ORS;  Service: Neurosurgery;  Laterality: Bilateral;  Lumbar two-three laminectomy  ? PACEMAKER IMPLANT N/A 05/03/2019  ? Procedure: PACEMAKER IMPLANT;  Surgeon: Constance Haw, MD;  Location: [place] CV LAB;  Service: Cardiovascular;  Laterality: N/A;  ? ? ? ?Current Outpatient Medications  ?Medication Sig Dispense Refill  ? amiodarone (PACERONE) 200 MG tablet TAKE ONE TABLET BY MOUTH EVERY DAY 90 tablet 3  ? ammonium lactate (LAC-HYDRIN) 12 % lotion Apply 1 application topically 2 (two) times daily. Applied to feet  2  ? carvedilol (COREG) 25 MG tablet Take 1 tablet (25 mg total) by mouth 2 (two) times daily. Please keep upcoming appt with Dr Curt Bears in April 2023 before anymore refills. Thank you Final Attempt 180 tablet 0  ? Cholecalciferol (VITAMIN D) 2000 units tablet Take 2,000 Units by mouth daily with breakfast.     ? Dulaglutide 3 MG/0.5ML SOPN Inject 0.5 mLs into the skin every 7 (seven) days.    ? ELIQUIS 5 MG TABS tablet TAKE ONE TABLET BY MOUTH TWICE DAILY 180 tablet 1  ? esomeprazole (NEXIUM) 40 MG capsule Take 40 mg by mouth daily before breakfast.   6  ? ferrous sulfate 325 (65 FE) MG tablet Take 325 mg by mouth daily with breakfast.    ? glucose blood test strip CHECK BLOOD  SUGAR FOUR TIMES DAILY    ? hydrALAZINE (APRESOLINE) 100 MG tablet Take 100 mg by mouth 3 (three) times daily.   6  ? insulin aspart (NOVOLOG) 100 UNIT/ML FlexPen Inject 30 Units into the skin daily with supper.     ? insulin glargine, 1 Unit Dial, (TOUJEO SOLOSTAR) 300 UNIT/ML Solostar Pen Inject 85 Units into the skin daily.    ? insulin glargine, 1 Unit Dial, (TOUJEO SOLOSTAR) 300 UNIT/ML Solostar Pen Inject 85 Units into the skin.    ? latanoprost (XALATAN) 0.005 % ophthalmic solution Place 1 drop into both eyes at bedtime.     ? mupirocin ointment (BACTROBAN) 2 % Apply 1 application topically 2 (two) times daily as needed for wound care.    ? omega-3 acid ethyl esters (LOVAZA) 1 g capsule Take 2 g by mouth 2 (two) times daily.    ? rosuvastatin (CRESTOR) 10 MG tablet Take 10 mg by mouth daily with supper.     ? torsemide (DEMADEX) 20 MG tablet Take 20 mg by mouth 2 (two) times daily.    ? vitamin B-12 (CYANOCOBALAMIN) 1000 MCG tablet Take 1,000 mcg by mouth daily.    ? ?No current facility-administered medications for this visit.  ? ? ?  Allergies:   Amlodipine besylate, Atorvastatin, and Ezetimibe  ? ?Social History:  The patient  reports that he has quit smoking. His smoking use included cigarettes and cigars. He has a 10.00 pack-year smoking history. He has been exposed to tobacco smoke. He has never used smokeless tobacco. He reports that he does not drink alcohol and does not use drugs.  ? ?Family History:  The patient's family history includes Cancer in his mother; Liver cancer in his father.  ? ?ROS:  Please see the history of present illness.   Otherwise, review of systems is positive for none.   All other systems are reviewed and negative.  ? ?PHYSICAL EXAM: ?VS:  BP (!) 164/62   Pulse 63   Ht 5\' 10"  (1.778 m)   Wt 243 lb 9.6 oz (110.5 kg)   SpO2 95%   BMI 34.95 kg/m?  , BMI Body mass index is 34.95 kg/m?. ?GEN: Well nourished, well developed, in no acute distress  ?HEENT: normal  ?Neck: no JVD,  carotid bruits, or masses ?Cardiac: RRR; no murmurs, rubs, or gallops,no edema  ?Respiratory:  clear to auscultation bilaterally, normal work of breathing ?GI: soft, nontender, nondistended, + BS ?MS: no deformity or

## 2021-11-10 NOTE — Patient Instructions (Addendum)
Medication Instructions:  ?Your physician recommends that you continue on your current medications as directed. Please refer to the Current Medication list given to you today. ? ?Please follow up with your PCP re: blood pressure ? ? ?*If you need a refill on your cardiac medications before your next appointment, please call your pharmacy* ? ? ?Lab Work: ? ?**Liver Panel and TSH today ? ?If you have labs (blood work) drawn today and your tests are completely normal, you will receive your results only by: ?MyChart Message (if you have MyChart) OR ?A paper copy in the mail ?If you have any lab test that is abnormal or we need to change your treatment, we will call you to review the results. ? ? ?Testing/Procedures: ?None ordered. ? ? ? ?Follow-Up: ?At [facility], you and your health needs are our priority.  As part of our continuing mission to provide you with exceptional heart care, we have created designated Provider Care Teams.  These Care Teams include your primary Cardiologist (physician) and Advanced Practice Providers (APPs -  Physician Assistants and Nurse Practitioners) who all work together to provide you with the care you need, when you need it. ? ?We recommend signing up for the patient portal called "MyChart".  Sign up information is provided on this After Visit Summary.  MyChart is used to connect with patients for Virtual Visits (Telemedicine).  Patients are able to view lab/test results, encounter notes, upcoming appointments, etc.  Non-urgent messages can be sent to your provider as well.   ?To learn more about what you can do with MyChart, go to [email or web address].   ? ?Your next appointment:   ?12 month(s) ? ?The format for your next appointment:   ?In Person ? ?Provider:   ?Allegra Lai, MD ? ? ?Important Information About Sugar ? ? ? ? ?  ?

## 2021-11-11 LAB — HEPATIC FUNCTION PANEL
ALT: 19 IU/L (ref 0–44)
AST: 17 IU/L (ref 0–40)
Albumin: 4.8 g/dL — ABNORMAL HIGH (ref 3.7–4.7)
Alkaline Phosphatase: 69 IU/L (ref 44–121)
Bilirubin Total: 0.3 mg/dL (ref 0.0–1.2)
Bilirubin, Direct: 0.1 mg/dL (ref 0.00–0.40)
Total Protein: 7.2 g/dL (ref 6.0–8.5)

## 2021-11-11 LAB — TSH: TSH: 1.36 u[IU]/mL (ref 0.450–4.500)

## 2021-11-24 NOTE — Progress Notes (Signed)
Remote pacemaker transmission.   

## 2022-01-01 ENCOUNTER — Other Ambulatory Visit: Payer: Self-pay

## 2022-01-01 MED ORDER — CARVEDILOL 25 MG PO TABS: 25.0000 mg | ORAL_TABLET | Freq: Two times a day (BID) | ORAL | 0 refills | Status: DC

## 2022-01-24 ENCOUNTER — Other Ambulatory Visit: Payer: Self-pay | Admitting: Cardiology

## 2022-02-06 ENCOUNTER — Ambulatory Visit (INDEPENDENT_AMBULATORY_CARE_PROVIDER_SITE_OTHER): Payer: Medicare PPO

## 2022-02-06 DIAGNOSIS — I495 Sick sinus syndrome: Secondary | ICD-10-CM

## 2022-02-07 LAB — CUP PACEART REMOTE DEVICE CHECK
Battery Remaining Longevity: 64 mo
Battery Remaining Percentage: 66 %
Battery Voltage: 2.99 V
Brady Statistic AP VP Percent: 76 %
Brady Statistic AP VS Percent: 1 %
Brady Statistic AS VP Percent: 24 %
Brady Statistic AS VS Percent: 1 %
Brady Statistic RA Percent Paced: 76 %
Brady Statistic RV Percent Paced: 99 %
Date Time Interrogation Session: 20230707020022
Implantable Lead Implant Date: 20200930
Implantable Lead Implant Date: 20200930
Implantable Lead Location: 753859
Implantable Lead Location: 753860
Implantable Pulse Generator Implant Date: 20200930
Lead Channel Impedance Value: 350 Ohm
Lead Channel Impedance Value: 530 Ohm
Lead Channel Pacing Threshold Amplitude: 0.75 V
Lead Channel Pacing Threshold Amplitude: 1.25 V
Lead Channel Pacing Threshold Pulse Width: 0.5 ms
Lead Channel Pacing Threshold Pulse Width: 0.5 ms
Lead Channel Sensing Intrinsic Amplitude: 3 mV
Lead Channel Sensing Intrinsic Amplitude: 7.2 mV
Lead Channel Setting Pacing Amplitude: 2 V
Lead Channel Setting Pacing Amplitude: 2.5 V
Lead Channel Setting Pacing Pulse Width: 0.5 ms
Lead Channel Setting Sensing Sensitivity: 2 mV
Pulse Gen Model: 2272
Pulse Gen Serial Number: 9159064

## 2022-02-23 NOTE — Progress Notes (Signed)
Remote pacemaker transmission.   

## 2022-03-03 DIAGNOSIS — E11621 Type 2 diabetes mellitus with foot ulcer: Secondary | ICD-10-CM | POA: Insufficient documentation

## 2022-03-03 HISTORY — DX: Type 2 diabetes mellitus with foot ulcer: E11.621

## 2022-04-15 ENCOUNTER — Other Ambulatory Visit: Payer: Self-pay | Admitting: Cardiology

## 2022-04-15 NOTE — Telephone Encounter (Signed)
*  STAT* If patient is at the pharmacy, call can be transferred to refill team.   1. Which medications need to be refilled? (please list name of each medication and dose if known) carvedilol (COREG) 25 MG tablet  2. Which pharmacy/location (including street and city if local pharmacy) is medication to be sent to? [place], [place]  3. Do they need a 30 day or 90 day supply? [place]

## 2022-04-16 MED ORDER — CARVEDILOL 25 MG PO TABS: 25.0000 mg | ORAL_TABLET | Freq: Two times a day (BID) | ORAL | 2 refills | Status: DC

## 2022-04-30 ENCOUNTER — Encounter: Payer: Self-pay | Admitting: Cardiology

## 2022-04-30 ENCOUNTER — Ambulatory Visit: Payer: Medicare PPO | Attending: Cardiology | Admitting: Cardiology

## 2022-04-30 VITALS — BP 132/60 | HR 65 | Ht 70.0 in | Wt 228.2 lb

## 2022-04-30 DIAGNOSIS — Z7901 Long term (current) use of anticoagulants: Secondary | ICD-10-CM | POA: Diagnosis not present

## 2022-04-30 DIAGNOSIS — I495 Sick sinus syndrome: Secondary | ICD-10-CM | POA: Diagnosis not present

## 2022-04-30 DIAGNOSIS — I480 Paroxysmal atrial fibrillation: Secondary | ICD-10-CM

## 2022-04-30 DIAGNOSIS — I110 Hypertensive heart disease with heart failure: Secondary | ICD-10-CM

## 2022-04-30 DIAGNOSIS — Z950 Presence of cardiac pacemaker: Secondary | ICD-10-CM

## 2022-04-30 DIAGNOSIS — Z79899 Other long term (current) drug therapy: Secondary | ICD-10-CM

## 2022-04-30 NOTE — Patient Instructions (Signed)
Medication Instructions:  Your physician recommends that you continue on your current medications as directed. Please refer to the Current Medication list given to you today.  *If you need a refill on your cardiac medications before your next appointment, please call your pharmacy*   Lab Work: Your physician recommends that you have labs done in the office today. Your test included  complete metabolic panel, TSH, T3 and T4.  If you have labs (blood work) drawn today and your tests are completely normal, you will receive your results only by: [place] (if you have MyChart) OR A paper copy in the mail If you have any lab test that is abnormal or we need to change your treatment, we will call you to review the results.   Testing/Procedures: None ordered   Follow-Up: At [facility], you and your health needs are our priority.  As part of our continuing mission to provide you with exceptional heart care, we have created designated Provider Care Teams.  These Care Teams include your primary Cardiologist (physician) and Advanced Practice Providers (APPs -  Physician Assistants and Nurse Practitioners) who all work together to provide you with the care you need, when you need it.  We recommend signing up for the patient portal called "MyChart".  Sign up information is provided on this After Visit Summary.  MyChart is used to connect with patients for Virtual Visits (Telemedicine).  Patients are able to view lab/test results, encounter notes, upcoming appointments, etc.  Non-urgent messages can be sent to your provider as well.   To learn more about what you can do with MyChart, go to [email or web address].    Your next appointment:   9 month(s)  The format for your next appointment:   In Person  Provider:   Shirlee More, MD   Other Instructions NA

## 2022-04-30 NOTE — Progress Notes (Signed)
Cardiology Office Note:    Date:  04/30/2022   ID:  Louis Neal, DOB 07/26/1951, MRN [id number]  PCP:  Algis Greenhouse, MD  Cardiologist:  Shirlee More, MD    Referring MD: Algis Greenhouse, MD    ASSESSMENT:    1. SSS (sick sinus syndrome) ([place])   2. PAF (paroxysmal atrial fibrillation) ([place])   3. Pacemaker - STJ   4. Chronic anticoagulation   5. Hypertensive heart disease with heart failure ([place])   6. On amiodarone therapy    PLAN:    In order of problems listed above:  [name] continues to do well since he had backup pacemaker for his bradycardia maintaining sinus rhythm on low-dose amiodarone I will check labs today including CMP for liver toxicity and thyroids and continue low-dose amiodarone and his current anticoagulant. Blood pressure at home runs typically in the 130-140/70 range much improved and he will continue his current multidrug regimen for what had been resistant hypertension with his CKD now well controlled including his carvedilol hydralazine and loop diuretic.  Heart failure is compensated LDL at target with diabetes continue with statin   Next appointment: 9 months follow-up low-dose amiodarone   Medication Adjustments/Labs and Tests Ordered: Current medicines are reviewed at length with the patient today.  Concerns regarding medicines are outlined above.  No orders of the defined types were placed in this encounter.  No orders of the defined types were placed in this encounter.   Chief complaint follow-up on amiodarone and has heart failure   History of Present Illness:    Louis Neal is a 71 y.o. male with a hx of  sick sinus syndrome permanent dual-chamber pacemaker for bradycardia paroxysmal atrial fibrillation suppressed with amiodarone therapy hypertensive heart disease with heart failure and stage IV CKD followed by [facility] nephrology and hyperlipidemia  last seen 08/15/2021.  Compliance with diet, lifestyle and medications:  Yes  He is pleased with the quality of his life And not having edema shortness of breath chest pain palpitation or syncope. He follows with nephrology [facility] stable CKD stage III He follows with our device clinic with normal device function and attaining sinus rhythm on amiodarone without symptoms of toxicity and tolerates his anticoagulant without bleeding  Recent labs 02/05/2022: Cholesterol 80 LDL 25 HDL 37 non-HDL cholesterol 43  Past Medical History:  Diagnosis Date   Anemia due to stage 4 chronic kidney disease ([place]) 05/11/2019   Formatting of this note might be different from the original. 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 Updated per 03/08/2019 [facility] Nephrology VN   Bilateral lower extremity edema 02/28/2016   Cardiorenal syndrome with renal failure 11/14/2018   Chronic anticoagulation 08/31/2017   CKD (chronic kidney disease) stage 3, GFR 30-59 ml/min ([place]) 11/30/2017   Degeneration of intervertebral disc of lumbar region 07/22/2012   Degenerative disc disease, lumbar 07/22/2012   Diabetes mellitus without complication ([place])    Diabetic ulcer of right fifth toe ([place]) 09/18/2019   Diaphragmatic hernia 09/04/2016   Elevated brain natriuretic peptide (BNP) level 10/26/2017   Essential hypertension 01/14/2016   Formatting of this note might be different from the original. Managed CARDS   Facet arthropathy, lumbar 07/22/2012   Gastric antral vascular  ectasia 12/17/2019   Formatting of this note might be different from the original. 2021: EGD   Gastroesophageal reflux disease without esophagitis 11/28/2015   Overview:  Managed GI  Formatting of this note might be different from the original. Managed GI   GERD (gastroesophageal reflux disease)     Glaucoma 02/28/2016   High risk medication use 01/14/2016   History of diabetic ulcer of foot 06/29/2017   Hyperkalemia 03/08/2018   Hyperlipidemia, mixed 02/28/2016   Hypertension    dr Bettina Gavia  in [place]   Hypertensive heart disease with heart failure ([place]) 01/14/2016   Hypotension arterial 01/14/2016   Low back pain 07/22/2012   Lumbosacral radiculopathy 32/99/2426   Metabolic bone disease 8/34/1962   Mobitz type 1 second degree atrioventricular block 10/14/2018   Obstructive sleep apnea 02/28/2016   Obstructive sleep apnea on CPAP 02/28/2016   Formatting of this note might be different from the original. Managed PULM 2019: CPAP, new   Olecranon bursitis 01/13/2017   Overview:  2018: left   On amiodarone therapy 10/14/2018   Onychomycosis 10/03/2019   Orthostatic hypotension 01/14/2016   PAF (paroxysmal atrial fibrillation) ([place]) 03/02/2017   Persistent proteinuria 12/15/2017   Pre-ulcerative calluses 10/17/2015   Renal artery stenosis (HCC) 02/28/2016   SSS (sick sinus syndrome) ([place]) 04/02/2017   Stucco keratoses 06/23/2016   Tachycardia-bradycardia syndrome ([place]) 04/02/2017   Type 2 diabetes mellitus, with long-term current use of insulin ([place]) 02/28/2016   Formatting of this note might be different from the original. Per 8/5//2020 [facility] Nephrology VN   Ulcer of foot, right, limited to breakdown of skin ([place]) 03/29/2018   Ulcer of right foot, with fat layer exposed ([place]) 08/22/2019   Vitamin D deficiency 12/15/2017   Wellness examination 07/08/2018    Past Surgical History:  Procedure Laterality Date   CARDIOVERSION N/A 06/21/2020   Procedure: CARDIOVERSION;  Surgeon: Sanda Klein, MD;  Location: [place];  Service: Cardiovascular;  Laterality: N/A;   LUMBAR LAMINECTOMY/DECOMPRESSION MICRODISCECTOMY Bilateral 09/22/2012   Procedure: LUMBAR LAMINECTOMY/DECOMPRESSION MICRODISCECTOMY 1 LEVEL;  Surgeon: Ophelia Charter, MD;  Location: [facility] NEURO [facility]S;  Service: Neurosurgery;  Laterality:  Bilateral;  Lumbar two-three laminectomy   PACEMAKER IMPLANT N/A 05/03/2019   Procedure: PACEMAKER IMPLANT;  Surgeon: Constance Haw, MD;  Location: [place] CV LAB;  Service: Cardiovascular;  Laterality: N/A;    Current Medications: Current Meds  Medication Sig   allopurinol (ZYLOPRIM) 100 MG tablet Take 100 mg by mouth daily.   amiodarone (PACERONE) 200 MG tablet TAKE ONE TABLET BY MOUTH EVERY DAY   ammonium lactate (LAC-HYDRIN) 12 % lotion Apply 1 application topically 2 (two) times daily. Applied to feet   apixaban (ELIQUIS) 5 MG TABS tablet Take 1 tablet (5 mg total) by mouth 2 (two) times daily. NEEDS LABS FOR ELIQUIS REFILLS, PLEASE CALL OFFICE   carvedilol (COREG) 25 MG tablet Take 1 tablet (25 mg total) by mouth 2 (two) times daily.   Cholecalciferol (VITAMIN D) 2000 units tablet Take 2,000 Units by mouth daily with breakfast.    esomeprazole (NEXIUM) 40 MG capsule Take 40 mg by mouth daily before breakfast.    ferrous sulfate 325 (65 FE) MG tablet Take 325 mg by mouth daily with breakfast.   glucose blood test strip CHECK BLOOD SUGAR FOUR TIMES DAILY   hydrALAZINE (APRESOLINE) 100 MG tablet Take 100 mg by mouth 3 (three) times daily.    insulin glargine, 1 Unit Dial, (TOUJEO SOLOSTAR) 300 UNIT/ML Solostar Pen Inject 80 Units into  the skin daily.   latanoprost (XALATAN) 0.005 % ophthalmic solution Place 1 drop into both eyes at bedtime.    mupirocin ointment (BACTROBAN) 2 % Apply 1 application topically 2 (two) times daily as needed for wound care.   omega-3 acid ethyl esters (LOVAZA) 1 g capsule Take 2 g by mouth 2 (two) times daily.   rosuvastatin (CRESTOR) 10 MG tablet Take 10 mg by mouth daily with supper.    torsemide (DEMADEX) 20 MG tablet Take 20 mg by mouth 2 (two) times daily.   TRULICITY 4.5 JF/3.5KT SOPN Inject 4.5 mg into the skin once a week.   vitamin B-12 (CYANOCOBALAMIN) 1000 MCG tablet Take 1,000 mcg by mouth daily.     Allergies:   Amlodipine besylate,  Atorvastatin, and Ezetimibe   Social History   Socioeconomic History   Marital status: Married    Spouse name: Not on file   Number of children: Not on file   Years of education: Not on file   Highest education level: Not on file  Occupational History   Not on file  Tobacco Use   Smoking status: Former    Packs/day: 2.00    Years: 5.00    Total pack years: 10.00    Types: Cigarettes, Cigars    Passive exposure: Past   Smokeless tobacco: Never   Tobacco comments:    quit 10 years ago  Vaping Use   Vaping Use: Never used  Substance and Sexual Activity   Alcohol use: No   Drug use: No   Sexual activity: Not on file  Other Topics Concern   Not on file  Social History Narrative   Not on file   Social Determinants of Health   Financial Resource Strain: Not on file  Food Insecurity: Not on file  Transportation Needs: Not on file  Physical Activity: Not on file  Stress: Not on file  Social Connections: Not on file     Family History: The patient's family history includes Cancer in his mother; Liver cancer in his father. ROS:   Please see the history of present illness.    All other systems reviewed and are negative.  EKGs/Labs/Other Studies Reviewed:    The following studies were reviewed today:  EKG:  EKG ordered today and personally reviewed.  The ekg ordered today demonstrates dual-chamber paced rhythm  Recent Labs: 11/10/2021: ALT 19; TSH 1.360    Physical Exam:    VS:  BP 132/60   Pulse 65   Ht 5\' 10"  (1.778 m)   Wt 228 lb 3.2 oz (103.5 kg)   SpO2 97%   BMI 32.74 kg/m     Wt Readings from Last 3 Encounters:  04/30/22 228 lb 3.2 oz (103.5 kg)  11/10/21 243 lb 9.6 oz (110.5 kg)  08/15/21 240 lb (108.9 kg)     GEN:  Well nourished, well developed in no acute distress HEENT: Normal NECK: No JVD; No carotid bruits LYMPHATICS: No lymphadenopathy CARDIAC: RRR, no murmurs, rubs, gallops RESPIRATORY:  Clear to auscultation without rales, wheezing or  rhonchi  ABDOMEN: Soft, non-tender, non-distended MUSCULOSKELETAL:  No edema; No deformity  SKIN: Warm and dry NEUROLOGIC:  Alert and oriented x 3 PSYCHIATRIC:  Normal affect    Signed, Shirlee More, MD  04/30/2022 9:36 AM    [place]

## 2022-05-01 LAB — COMPREHENSIVE METABOLIC PANEL
ALT: 15 IU/L (ref 0–44)
AST: 17 IU/L (ref 0–40)
Albumin/Globulin Ratio: 2 (ref 1.2–2.2)
Albumin: 4.5 g/dL (ref 3.8–4.8)
Alkaline Phosphatase: 74 IU/L (ref 44–121)
BUN/Creatinine Ratio: 18 (ref 10–24)
BUN: 36 mg/dL — ABNORMAL HIGH (ref 8–27)
Bilirubin Total: 0.2 mg/dL (ref 0.0–1.2)
CO2: 24 mmol/L (ref 20–29)
Calcium: 9.3 mg/dL (ref 8.6–10.2)
Chloride: 96 mmol/L (ref 96–106)
Creatinine, Ser: 1.99 mg/dL — ABNORMAL HIGH (ref 0.76–1.27)
Globulin, Total: 2.2 g/dL (ref 1.5–4.5)
Glucose: 216 mg/dL — ABNORMAL HIGH (ref 70–99)
Potassium: 3.8 mmol/L (ref 3.5–5.2)
Sodium: 137 mmol/L (ref 134–144)
Total Protein: 6.7 g/dL (ref 6.0–8.5)
eGFR: 35 mL/min/{1.73_m2} — ABNORMAL LOW (ref >59)

## 2022-05-01 LAB — T4: T4, Total: 9.6 ug/dL (ref 4.5–12.0)

## 2022-05-01 LAB — T3: T3, Total: 89 ng/dL (ref 71–180)

## 2022-05-01 LAB — TSH: TSH: 1.11 u[IU]/mL (ref 0.450–4.500)

## 2022-05-06 ENCOUNTER — Other Ambulatory Visit: Payer: Self-pay | Admitting: Cardiology

## 2022-05-06 NOTE — Telephone Encounter (Signed)
Prescription refill request for Eliquis received. Indication:  PAF Last office visit: 04/30/22  Rinaldo Cloud MD Scr: 1.99 on 04/30/22 Age: 71 Weight: 103.5kg  Based on above findings Eliquis 5mg  twice daily is the appropriate dose.  Refill approved.

## 2022-05-08 ENCOUNTER — Ambulatory Visit (INDEPENDENT_AMBULATORY_CARE_PROVIDER_SITE_OTHER): Payer: Medicare PPO

## 2022-05-08 DIAGNOSIS — I495 Sick sinus syndrome: Secondary | ICD-10-CM

## 2022-05-08 LAB — CUP PACEART REMOTE DEVICE CHECK
Battery Remaining Longevity: 59 mo
Battery Remaining Percentage: 63 %
Battery Voltage: 2.99 V
Brady Statistic AP VP Percent: 72 %
Brady Statistic AP VS Percent: 1 %
Brady Statistic AS VP Percent: 28 %
Brady Statistic AS VS Percent: 1 %
Brady Statistic RA Percent Paced: 72 %
Brady Statistic RV Percent Paced: 99 %
Date Time Interrogation Session: 20231006024118
Implantable Lead Implant Date: 20200930
Implantable Lead Implant Date: 20200930
Implantable Lead Location: 753859
Implantable Lead Location: 753860
Implantable Pulse Generator Implant Date: 20200930
Lead Channel Impedance Value: 330 Ohm
Lead Channel Impedance Value: 490 Ohm
Lead Channel Pacing Threshold Amplitude: 0.75 V
Lead Channel Pacing Threshold Amplitude: 1.25 V
Lead Channel Pacing Threshold Pulse Width: 0.5 ms
Lead Channel Pacing Threshold Pulse Width: 0.5 ms
Lead Channel Sensing Intrinsic Amplitude: 1.5 mV
Lead Channel Sensing Intrinsic Amplitude: 7.2 mV
Lead Channel Setting Pacing Amplitude: 2 V
Lead Channel Setting Pacing Amplitude: 2.5 V
Lead Channel Setting Pacing Pulse Width: 0.5 ms
Lead Channel Setting Sensing Sensitivity: 2 mV
Pulse Gen Model: 2272
Pulse Gen Serial Number: 9159064

## 2022-05-12 NOTE — Progress Notes (Signed)
Remote pacemaker transmission.   

## 2022-07-07 DIAGNOSIS — M109 Gout, unspecified: Secondary | ICD-10-CM | POA: Insufficient documentation

## 2022-07-07 HISTORY — DX: Gout, unspecified: M10.9

## 2022-08-06 LAB — CUP PACEART REMOTE DEVICE CHECK
Battery Remaining Longevity: 56 mo
Battery Remaining Percentage: 60 %
Battery Voltage: 2.99 V
Brady Statistic AP VP Percent: 71 %
Brady Statistic AP VS Percent: 1 %
Brady Statistic AS VP Percent: 29 %
Brady Statistic AS VS Percent: 1 %
Brady Statistic RA Percent Paced: 70 %
Brady Statistic RV Percent Paced: 99 %
Date Time Interrogation Session: 20240104113621
Implantable Lead Connection Status: 753985
Implantable Lead Connection Status: 753985
Implantable Lead Implant Date: 20200930
Implantable Lead Implant Date: 20200930
Implantable Lead Location: 753859
Implantable Lead Location: 753860
Implantable Pulse Generator Implant Date: 20200930
Lead Channel Impedance Value: 310 Ohm
Lead Channel Impedance Value: 460 Ohm
Lead Channel Pacing Threshold Amplitude: 0.75 V
Lead Channel Pacing Threshold Amplitude: 1.25 V
Lead Channel Pacing Threshold Pulse Width: 0.5 ms
Lead Channel Pacing Threshold Pulse Width: 0.5 ms
Lead Channel Sensing Intrinsic Amplitude: 0.9 mV
Lead Channel Sensing Intrinsic Amplitude: 3.6 mV
Lead Channel Setting Pacing Amplitude: 2 V
Lead Channel Setting Pacing Amplitude: 2.5 V
Lead Channel Setting Pacing Pulse Width: 0.5 ms
Lead Channel Setting Sensing Sensitivity: 2 mV
Pulse Gen Model: 2272
Pulse Gen Serial Number: 9159064

## 2022-08-07 ENCOUNTER — Ambulatory Visit (INDEPENDENT_AMBULATORY_CARE_PROVIDER_SITE_OTHER): Payer: Medicare PPO

## 2022-08-07 DIAGNOSIS — I495 Sick sinus syndrome: Secondary | ICD-10-CM

## 2022-08-07 LAB — CUP PACEART REMOTE DEVICE CHECK
Battery Remaining Longevity: 57 mo
Battery Remaining Percentage: 60 %
Battery Voltage: 2.99 V
Brady Statistic AP VP Percent: 71 %
Brady Statistic AP VS Percent: 1 %
Brady Statistic AS VP Percent: 29 %
Brady Statistic AS VS Percent: 1 %
Brady Statistic RA Percent Paced: 70 %
Brady Statistic RV Percent Paced: 99 %
Date Time Interrogation Session: 20240105103219
Implantable Lead Connection Status: 753985
Implantable Lead Connection Status: 753985
Implantable Lead Implant Date: 20200930
Implantable Lead Implant Date: 20200930
Implantable Lead Location: 753859
Implantable Lead Location: 753860
Implantable Pulse Generator Implant Date: 20200930
Lead Channel Impedance Value: 330 Ohm
Lead Channel Impedance Value: 480 Ohm
Lead Channel Pacing Threshold Amplitude: 0.75 V
Lead Channel Pacing Threshold Amplitude: 1.25 V
Lead Channel Pacing Threshold Pulse Width: 0.5 ms
Lead Channel Pacing Threshold Pulse Width: 0.5 ms
Lead Channel Sensing Intrinsic Amplitude: 1.1 mV
Lead Channel Sensing Intrinsic Amplitude: 3.6 mV
Lead Channel Setting Pacing Amplitude: 2 V
Lead Channel Setting Pacing Amplitude: 2.5 V
Lead Channel Setting Pacing Pulse Width: 0.5 ms
Lead Channel Setting Sensing Sensitivity: 2 mV
Pulse Gen Model: 2272
Pulse Gen Serial Number: 9159064

## 2022-08-21 ENCOUNTER — Other Ambulatory Visit: Payer: Self-pay | Admitting: Cardiology

## 2022-08-24 DIAGNOSIS — L309 Dermatitis, unspecified: Secondary | ICD-10-CM | POA: Insufficient documentation

## 2022-08-24 HISTORY — DX: Dermatitis, unspecified: L30.9

## 2022-08-24 NOTE — Progress Notes (Signed)
Remote pacemaker transmission.   

## 2022-09-10 ENCOUNTER — Encounter (HOSPITAL_COMMUNITY): Payer: Self-pay | Admitting: *Deleted

## 2022-10-17 ENCOUNTER — Other Ambulatory Visit: Payer: Self-pay | Admitting: Cardiology

## 2022-10-19 NOTE — Telephone Encounter (Signed)
Prescription refill request for Eliquis received. Indication: PAF Last office visit: 04/30/22  Rinaldo Cloud MD Scr: 2.15 on 07/16/22  Epic Age: 72 Weight: 103.5kg  Based on above findings Eliquis 5mg  twice daily is the appropriate dose.  Refill approved.

## 2022-11-06 ENCOUNTER — Ambulatory Visit: Payer: Medicare PPO | Attending: Cardiology

## 2022-11-06 DIAGNOSIS — I495 Sick sinus syndrome: Secondary | ICD-10-CM

## 2022-11-08 LAB — CUP PACEART REMOTE DEVICE CHECK
Battery Remaining Longevity: 53 mo
Battery Remaining Percentage: 57 %
Battery Voltage: 2.99 V
Brady Statistic AP VP Percent: 72 %
Brady Statistic AP VS Percent: 1 %
Brady Statistic AS VP Percent: 28 %
Brady Statistic AS VS Percent: 1 %
Brady Statistic RA Percent Paced: 72 %
Brady Statistic RV Percent Paced: 99 %
Date Time Interrogation Session: 20240405020012
Implantable Lead Connection Status: 753985
Implantable Lead Connection Status: 753985
Implantable Lead Implant Date: 20200930
Implantable Lead Implant Date: 20200930
Implantable Lead Location: 753859
Implantable Lead Location: 753860
Implantable Pulse Generator Implant Date: 20200930
Lead Channel Impedance Value: 310 Ohm
Lead Channel Impedance Value: 480 Ohm
Lead Channel Pacing Threshold Amplitude: 0.75 V
Lead Channel Pacing Threshold Amplitude: 1.25 V
Lead Channel Pacing Threshold Pulse Width: 0.5 ms
Lead Channel Pacing Threshold Pulse Width: 0.5 ms
Lead Channel Sensing Intrinsic Amplitude: 2.6 mV
Lead Channel Sensing Intrinsic Amplitude: 3.6 mV
Lead Channel Setting Pacing Amplitude: 2 V
Lead Channel Setting Pacing Amplitude: 2.5 V
Lead Channel Setting Pacing Pulse Width: 0.5 ms
Lead Channel Setting Sensing Sensitivity: 2 mV
Pulse Gen Model: 2272
Pulse Gen Serial Number: 9159064

## 2022-11-16 ENCOUNTER — Encounter: Payer: Medicare PPO | Admitting: Cardiology

## 2022-11-20 ENCOUNTER — Encounter: Payer: Medicare PPO | Admitting: Cardiology

## 2022-11-26 DIAGNOSIS — D631 Anemia in chronic kidney disease: Secondary | ICD-10-CM

## 2022-11-26 DIAGNOSIS — D649 Anemia, unspecified: Secondary | ICD-10-CM | POA: Insufficient documentation

## 2022-11-26 DIAGNOSIS — N183 Anemia in chronic kidney disease: Secondary | ICD-10-CM

## 2022-11-26 HISTORY — DX: Anemia in chronic kidney disease: D63.1

## 2022-11-26 HISTORY — DX: Anemia in chronic kidney disease: N18.30

## 2022-12-01 ENCOUNTER — Other Ambulatory Visit: Payer: Self-pay | Admitting: Cardiology

## 2022-12-09 NOTE — Progress Notes (Signed)
Remote pacemaker transmission.   

## 2023-01-18 ENCOUNTER — Telehealth: Payer: Self-pay

## 2023-01-18 NOTE — Telephone Encounter (Signed)
   Pre-operative Risk Assessment    Patient Name: Louis Neal  DOB: October 23, 1950 MRN: [id number]      Request for Surgical Clearance    Procedure:   EGD  Date of Surgery:  Clearance 02/17/23                                 Surgeon:  Dr. Marcial Pacas [place] Surgeon's Group or Practice Name:  [facility] Phone number:  [phone] Fax number:  [phone]   Type of Clearance Requested:   - Pharmacy:  Hold Apixaban (Eliquis) on July 15 th   Type of Anesthesia:  Not Indicated   Additional requests/questions:    Merlene Laughter   01/18/2023, 4:46 PM

## 2023-01-19 NOTE — Telephone Encounter (Signed)
Patient with diagnosis of afib on Eliquis for anticoagulation.    Procedure: EGD Date of procedure: 02/17/23  CHA2DS2-VASc Score = 4  This indicates a 4.8% annual risk of stroke. The patient's score is based upon: CHF History: 1 HTN History: 1 Diabetes History: 1 Stroke History: 0 Vascular Disease History: 0 Age Score: 1 Gender Score: 0  CrCl 18mL/min Platelet count 267K  Per office protocol, patient can hold Eliquis for 2 days prior to procedure as requested.    **This guidance is not considered finalized until pre-operative APP has relayed final recommendations.**

## 2023-01-19 NOTE — Telephone Encounter (Signed)
   Patient Name: Louis Neal  DOB: 07-03-1951 MRN: [id number]  Primary Cardiologist: Norman Herrlich, MD  Clinical pharmacists have reviewed the patient's past medical history, labs, and current medications as part of preoperative protocol coverage. The following recommendations have been made:  Patient with diagnosis of afib on Eliquis for anticoagulation.     Procedure: EGD Date of procedure: 02/17/23   CHA2DS2-VASc Score = 4  This indicates a 4.8% annual risk of stroke. The patient's score is based upon: CHF History: 1 HTN History: 1 Diabetes History: 1 Stroke History: 0 Vascular Disease History: 0 Age Score: 1 Gender Score: 0   CrCl 42mL/min Platelet count 267K   Per office protocol, patient can hold Eliquis for 2 days prior to procedure as requested. Please resume Eliquis as soon as possible postprocedure, at the discretion of the surgeon.       I will route this recommendation to the requesting party via Epic fax function and remove from pre-op pool.  Please call with questions.  Joylene Grapes, NP 01/19/2023, 10:52 AM

## 2023-01-24 NOTE — Progress Notes (Unsigned)
Cardiology Office Note:    Date:  01/25/2023   ID:  Louis Neal, DOB 1951/02/18, MRN [id number]  PCP:  Olive Bass, MD  Cardiologist:  Norman Herrlich, MD    Referring MD: Olive Bass, MD    ASSESSMENT:    1. SSS (sick sinus syndrome) (HCC)   2. Pacemaker - STJ   3. PAF (paroxysmal atrial fibrillation) (HCC)   4. Chronic anticoagulation   5. On amiodarone therapy   6. Hypertensive heart disease with heart failure (HCC)   7. 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  Home blood pressure runs in the range of 110/60.  Manage decrease the dose of his carvedilol by 50% continue his current guideline directed antihypertensive therapy including hydralazine and his loop diuretic.  Recheck echocardiogram ejection fraction a myocardial perfusion study. Maintaining sinus rhythm continue low-dose amiodarone anticoagulant Heart failure is nicely compensated blood pressure is relatively low and I am going to decrease his carvedilol dosage Stable CKD is followed by nephrology [facility]   Next appointment: 6 months   Medication Adjustments/Labs and Tests Ordered: Current medicines are reviewed at length with the patient today.  Concerns regarding medicines are outlined above.  No orders of the defined types were placed in this encounter.  No orders of the defined types were placed in this encounter.    History of Present Illness:    Louis Neal is a 72 y.o. male with a hx of sick sinus syndrome with permanent dual-chamber pacemaker for bradycardia paroxysmal atrial fibrillation suppressed with low-dose amiodarone therapy hypertensive heart disease with heart failure and  stage IV CKD followed by [facility] nephrology and hyperlipidemia last seen 04/22/2022.  He is anemic with a hemoglobin of 9.21-month ago and his most recent creatinine was 1.91 with a GFR of 36 cc/min.  Lipid panel 08/18/2022 showed a total cholesterol of 87 LDL direct 26 non-HDL cholesterol 46.  Compliance with diet, lifestyle and medications: Yes  Overall is doing well but recently notices some exercise intolerance exertional shortness of breath weakness in his legs when he tries to do more vigorous activity walking a longer distance incline or using his upper extremities in the garden he is also had some vague nonexertional chest discomfort.  He wonders if this is due to heat or humidity or whether it is underlying heart disease He is not having edema orthopnea palpitations syncope TIA or bleeding from his anticoagulant. Past Medical History:  Diagnosis Date   Anemia due to stage 4 chronic kidney disease (HCC) 05/11/2019   Formatting of this note might be different from the original. 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 Updated per 03/08/2019 [facility]N   Bilateral lower extremity edema 02/28/2016   Cardiorenal syndrome with renal failure 11/14/2018   Chronic anticoagulation 08/31/2017   CKD (chronic kidney disease) stage 3, GFR 30-59 ml/min (HCC) 11/30/2017   Degeneration of intervertebral disc of lumbar region 07/22/2012   Degenerative  disc disease, lumbar 07/22/2012   Diabetes mellitus without complication (HCC)    Diabetic ulcer of right fifth toe (HCC) 09/18/2019   Diaphragmatic hernia 09/04/2016   Elevated brain natriuretic peptide (BNP) level 10/26/2017   Essential hypertension 01/14/2016   Formatting of this note might be different from  the original. 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 2021: EGD   Gastroesophageal reflux disease without esophagitis 11/28/2015   Overview:  Managed GI  Formatting of this note might be different from the original. Managed GI   GERD (gastroesophageal reflux disease)    Glaucoma 02/28/2016   High risk medication use 01/14/2016   History of diabetic ulcer of foot 06/29/2017   Hyperkalemia 03/08/2018   Hyperlipidemia, mixed 02/28/2016   Hypertension    dr Dulce Sellar  in [place]   Hypertensive heart disease with heart failure (HCC) 01/14/2016   Hypotension arterial 01/14/2016   Low back pain 07/22/2012   Lumbosacral radiculopathy 07/22/2012   Metabolic bone disease 12/15/2017   Mobitz type 1 second degree atrioventricular block 10/14/2018   Obstructive sleep apnea 02/28/2016   Obstructive sleep apnea on CPAP 02/28/2016   Formatting of this note might be different from the original. 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 Per 8/5//2020 [facility]phrology VN   Ulcer of foot, right, limited to breakdown of skin (HCC) 03/29/2018   Ulcer of right foot, with fat layer exposed (HCC) 08/22/2019   Vitamin D deficiency 12/15/2017   Wellness examination 07/08/2018    Past Surgical History:  Procedure Laterality Date    CARDIOVERSION N/A 06/21/2020   Procedure: CARDIOVERSION;  Surgeon: Thurmon Fair, MD;  Location: [facility] ENDOSCOPY;  Service: Cardiovascular;  Laterality: N/A;   LUMBAR LAMINECTOMY/DECOMPRESSION MICRODISCECTOMY Bilateral 09/22/2012   Procedure: LUMBAR LAMINECTOMY/DECOMPRESSION MICRODISCECTOMY 1 LEVEL;  Surgeon: Cristi Loron, MD;  Location: [facility] NEURO ORS;  Service: Neurosurgery;  Laterality: Bilateral;  Lumbar two-three laminectomy   PACEMAKER IMPLANT N/A 05/03/2019   Procedure: PACEMAKER IMPLANT;  Surgeon: Regan Lemming, MD;  Location: [facility] INVASIVE CV LAB;  Service: Cardiovascular;  Laterality: N/A;    Current Medications: Current Meds  Medication Sig   amiodarone (PACERONE) 200 MG tablet Take 1 tablet (200 mg total) by mouth daily.   ammonium lactate (LAC-HYDRIN) 12 % lotion Apply 1 application topically 2 (two) times daily. Applied to feet   carvedilol (COREG) 25 MG tablet Take 1 tablet (25 mg total) by mouth 2 (two) times daily.   Cholecalciferol (VITAMIN D) 2000 units tablet Take 2,000 Units by mouth daily with breakfast.    clobetasol cream (TEMOVATE) 0.05 % Apply 1 Application topically 2 (two) times daily as needed (rash).   colchicine 0.6 MG tablet Take 0.6 mg by mouth daily as needed (gout).   ELIQUIS 5 MG TABS tablet TAKE ONE TABLET BY MOUTH TWICE DAILY   epoetin alfa (EPOGEN)  20000 UNIT/ML injection Inject 20,000 Units into the skin every 14 (fourteen) days.   esomeprazole (NEXIUM) 40 MG capsule Take 40 mg by mouth daily before breakfast.    febuxostat (ULORIC) 40 MG tablet Take 1 tablet by mouth daily.   ferrous sulfate 325 (65 FE) MG tablet Take 325 mg by mouth daily with breakfast.   hydrALAZINE (APRESOLINE) 100 MG tablet Take 100 mg by mouth 3 (three) times daily.    insulin glargine, 1 Unit Dial, (TOUJEO SOLOSTAR) 300 UNIT/ML Solostar Pen Inject 80 Units into the skin daily.   JARDIANCE 25 MG TABS tablet Take 25 mg by mouth daily.   latanoprost (XALATAN) 0.005 %  ophthalmic solution Place 1 drop into both eyes at bedtime.    mupirocin ointment (BACTROBAN) 2 % Apply 1 application topically 2 (two) times daily as needed for wound care.   omega-3 acid ethyl esters (LOVAZA) 1 g capsule Take 2 g by mouth 2 (two) times daily.   rosuvastatin (CRESTOR) 10 MG tablet Take 10 mg by mouth daily with supper.    torsemide (DEMADEX) 20 MG tablet Take 20 mg by mouth 2 (two) times daily.   TRULICITY 4.5 MG/0.5ML SOPN Inject 4.5 mg into the skin once a week.   vitamin B-12 (CYANOCOBALAMIN) 1000 MCG tablet Take 1,000 mcg by mouth daily.     Allergies:   Amlodipine besylate, Atorvastatin, and Ezetimibe   Social History   Socioeconomic History   Marital status: Married    Spouse name: Not on file   Number of children: Not on file   Years of education: Not on file   Highest education level: Not on file  Occupational History   Not on file  Tobacco Use   Smoking status: Former    Packs/day: 2.00    Years: 5.00    Additional pack years: 0.00    Total pack years: 10.00    Types: Cigarettes, Cigars    Passive exposure: Past   Smokeless tobacco: Never   Tobacco comments:    quit 10 years ago  Vaping Use   Vaping Use: Never used  Substance and Sexual Activity   Alcohol use: No   Drug use: No   Sexual activity: Not on file  Other Topics Concern   Not on file  Social History Narrative   Not on file   Social Determinants of Health   Financial Resource Strain: Not on file  Food Insecurity: Not on file  Transportation Needs: Not on file  Physical Activity: Not on file  Stress: Not on file  Social Connections: Not on file      EKGs/Labs/Other Studies Reviewed:    The following studies were reviewed today:  Cardiac Studies & Procedures       ECHOCARDIOGRAM  ECHOCARDIOGRAM COMPLETE 11/26/2017  Narrative *[facility]* [address] [place], [place]  [address] [phone]  ------------------------------------------------------------------- Transthoracic Echocardiography  Patient:    Daymien, Goth MR #:       [id number] Study Date: 11/26/2017 Gender:     M Age:        44 Height:     180.3 cm Weight:     121.3 kg BSA:        2.51 m^2 Pt. Status: Room:  Kandis Nab ATTENDING    Norman Herrlich, MD ORDERING     Norman Herrlich, MD REFERRING    Norman Herrlich, MD PERFORMING   [facility] SONOGRAPHER  [place],  RDCS  cc:  -------------------------------------------------------------------  ------------------------------------------------------------------- Indications:      Dyspnea 786.09.  ------------------------------------------------------------------- History:   PMH:   Atrial fibrillation.  Risk factors: Hypertension. Diabetes mellitus.  ------------------------------------------------------------------- Study Conclusions  - Left ventricle: The cavity size was normal. There was mild concentric hypertrophy. Systolic function was normal. Wall motion was normal; there were no regional wall motion abnormalities. The study is not technically sufficient to allow evaluation of LV diastolic function. - Mitral valve: There was moderate regurgitation. - Left atrium: The atrium was moderately dilated.  Impressions:  - Normal LVEF 60-65% Mild LVH. Moderate LAE. Moderate MR. Trace TR. Undetermined diastolic function.  ------------------------------------------------------------------- Study data:  No prior study was available for comparison.  Study status:  Routine.  Procedure:  The patient reported no pain pre or post test. Transthoracic echocardiography. Image quality was adequate.  Study completion:  There were no complications. Transthoracic echocardiography.  M-mode, complete 2D, spectral Doppler, and color Doppler.  Birthdate:  Patient birthdate: [birth year].  Age:  Patient is 71 yr old.   Sex:  Gender: male. BMI: 37.3 kg/m^2.  Blood pressure:     148/70  Patient status: Outpatient.  Study date:  Study date: 11/26/2017. Study time: 09:09 AM.  Location:  Echo laboratory.  -------------------------------------------------------------------  ------------------------------------------------------------------- Left ventricle:  The cavity size was normal. There was mild concentric hypertrophy. Systolic function was normal. Wall motion was normal; there were no regional wall motion abnormalities. The study is not technically sufficient to allow evaluation of LV diastolic function.  ------------------------------------------------------------------- Aortic valve:   Trileaflet; normal thickness leaflets. Mobility was not restricted. Sclerosis without stenosis.  Doppler: Transvalvular velocity was within the normal range. There was no stenosis. There was no regurgitation.  ------------------------------------------------------------------- Aorta:  Aortic root: The aortic root was normal in size.  ------------------------------------------------------------------- Mitral valve:   Structurally normal valve.   Mobility was not restricted.  Doppler:  Transvalvular velocity was within the normal range. There was no evidence for stenosis. There was moderate regurgitation.    Indexed valve area by continuity equation (using LVOT flow): 1.51 cm^2/m^2.    Mean gradient (D): 6 mm Hg. Peak gradient (D): 14 mm Hg.  ------------------------------------------------------------------- Left atrium:  The atrium was moderately dilated.  ------------------------------------------------------------------- Right ventricle:  The cavity size was normal. Wall thickness was normal. Systolic function was normal.  ------------------------------------------------------------------- Pulmonic valve:    Structurally normal valve.   Cusp separation was normal.  Doppler:  Transvalvular velocity was  within the normal range. There was no evidence for stenosis. There was no regurgitation.  ------------------------------------------------------------------- Tricuspid valve:   Structurally normal valve.    Doppler: Transvalvular velocity was within the normal range. There was trivial regurgitation.  ------------------------------------------------------------------- Pulmonary artery:   The main pulmonary artery was normal-sized. Systolic pressure was within the normal range.  ------------------------------------------------------------------- Right atrium:  The atrium was normal in size.  ------------------------------------------------------------------- Pericardium:  There was no pericardial effusion.  ------------------------------------------------------------------- Systemic veins: Inferior vena cava: The vessel was normal in size.  ------------------------------------------------------------------- Measurements  Left ventricle                           Value          Reference LV ID, ED, PLAX chordal                  44.6  mm       43 - 52 LV ID, ES, PLAX chordal  33.6  mm       23 - 38 LV fx shortening, PLAX chordal    (L)    25    %        >=29 LV PW thickness, ED                      14.2  mm       ---------- IVS/LV PW ratio, ED                      1.03           <=1.3 Stroke volume, 2D                        167   ml       ---------- Stroke volume/bsa, 2D                    66    ml/m^2   ---------- LV e&', lateral                           18.6  cm/s     ---------- LV E/e&', lateral                         9.89           ---------- LV e&', medial                            18.8  cm/s     ---------- LV E/e&', medial                          9.79           ---------- LV e&', average                           18.7  cm/s     ---------- LV E/e&', average                         9.84           ---------- Longitudinal strain, TDI                 18    %         ----------  Ventricular septum                       Value          Reference IVS thickness, ED                        14.6  mm       ----------  LVOT                                     Value          Reference LVOT ID, S                               25    mm       ----------  LVOT area                                4.91  cm^2     ---------- LVOT peak velocity, S                    141   cm/s     ---------- LVOT mean velocity, S                    87.6  cm/s     ---------- LVOT VTI, S                              34.1  cm       ---------- LVOT peak gradient, S                    8     mm Hg    ----------  Aorta                                    Value          Reference Aortic root ID, ED                       35    mm       ---------- Ascending aorta ID, A-P, S               37    mm       ----------  Left atrium                              Value          Reference LA ID, A-P, ES                           48    mm       ---------- LA ID/bsa, A-P                           1.91  cm/m^2   <=2.2 LA volume, S                             98    ml       ---------- LA volume/bsa, S                         39    ml/m^2   ---------- LA volume, ES, 1-p A4C                   90    ml       ---------- LA volume/bsa, ES, 1-p A4C               35.8  ml/m^2   ---------- LA volume, ES, 1-p A2C                   107   ml       ---------- LA volume/bsa, ES, 1-p A2C  42.6  ml/m^2   ----------  Mitral valve                             Value          Reference Mitral E-wave peak velocity              184   cm/s     ---------- Mitral mean velocity, D                  96.5  cm/s     ---------- Mitral deceleration time                 194   ms       150 - 230 Mitral mean gradient, D                  6     mm Hg    ---------- Mitral peak gradient, D                  14    mm Hg    ---------- Mitral valve area/bsa, LVOT              1.51  cm^2/m^2 ---------- continuity Mitral annulus  VTI, D                    46.4  cm       ---------- Mitral regurg VTI, PISA                  214   cm       ---------- Mitral ERO, PISA                         0.27  cm^2     ---------- Mitral regurg volume, PISA               58    ml       ----------  Pulmonary arteries                       Value          Reference PA pressure, S, DP                       28    mm Hg    <=30  Tricuspid valve                          Value          Reference Tricuspid regurg peak velocity           251   cm/s     ---------- Tricuspid peak RV-RA gradient            25    mm Hg    ----------  Right atrium                             Value          Reference RA ID, S-I, ES, A4C               (H)    57.2  mm       34 - 49 RA area, ES, A4C                  (  H)    20.5  cm^2     8.3 - 19.5 RA volume, ES, A/L                       62.3  ml       ---------- RA volume/bsa, ES, A/L                   24.8  ml/m^2   ----------  Systemic veins                           Value          Reference Estimated CVP                            3     mm Hg    ----------  Right ventricle                          Value          Reference TAPSE                                    23.3  mm       ---------- RV pressure, S, DP                       28    mm Hg    <=30 RV s&', lateral, S                        12.1  cm/s     ----------  Legend: (L)  and  (H)  mark values outside specified reference range.  ------------------------------------------------------------------- Prepared and Electronically Authenticated by  Gypsy Balsam, MD 2019-04-26T11:37:26    MONITORS  LONG TERM MONITOR (3-14 DAYS) 09/22/2018  Narrative A ZIO monitor was performed for 3 days starting 09/14/2018.  Predominant rhythm is sinus with minimum average and maximum heart rates of 57, 71 and 92 bpm.  There are episodes of Mobitz 1 second-degree heart block seen.  There are no episodes of pauses 3 seconds or greater or high degree sinus node or AV  AV or sinus node block.  Atrial fibrillation/flutter is present with a 2% burden average rate 46 bpm rate varying from 30 to 76 bpm.  Rare isolated PVCs are present.  Rare isolated APCs are present.  There were no triggered or diary events.   Conclusion paroxysmal atrial fibrillation with a relatively slow response, Mobitz 1 second-degree AV block.           EKG:  EKG ordered today and personally reviewed.  The ekg ordered today demonstrates dual-chamber paced rhythm normal function  Recent Labs: 04/30/2022: ALT 15; BUN 36; Creatinine, Ser 1.99; Potassium 3.8; Sodium 137; TSH 1.110  Recent Lipid Panel No results found for: "CHOL", "TRIG", "HDL", "CHOLHDL", "VLDL", "LDLCALC", "LDLDIRECT"  Physical Exam:    VS:  BP 100/70 (BP Location: Right Arm, Patient Position: Sitting, Cuff Size: Normal)   Pulse 60   Ht 5\' 10"  (1.778 m)   Wt 213 lb (96.6 kg)   SpO2 93%   BMI 30.56 kg/m     Wt Readings from Last 3 Encounters:  01/25/23 213 lb (96.6 kg)  04/30/22 228 lb  3.2 oz (103.5 kg)  11/10/21 243 lb 9.6 oz (110.5 kg)     GEN:  Well nourished, well developed in no acute distress HEENT: Normal NECK: No JVD; No carotid bruits LYMPHATICS: No lymphadenopathy CARDIAC: RRR, no murmurs, rubs, gallops RESPIRATORY:  Clear to auscultation without rales, wheezing or rhonchi  ABDOMEN: Soft, non-tender, non-distended MUSCULOSKELETAL:  No edema; No deformity  SKIN: Warm and dry NEUROLOGIC:  Alert and oriented x 3 PSYCHIATRIC:  Normal affect    Signed, Norman Herrlich, MD  01/25/2023 8:32 AM    [facility]

## 2023-01-25 ENCOUNTER — Ambulatory Visit: Payer: Medicare PPO | Attending: Cardiology | Admitting: Cardiology

## 2023-01-25 ENCOUNTER — Encounter: Payer: Self-pay | Admitting: Cardiology

## 2023-01-25 VITALS — BP 100/70 | HR 60 | Ht 70.0 in | Wt 213.0 lb

## 2023-01-25 DIAGNOSIS — I480 Paroxysmal atrial fibrillation: Secondary | ICD-10-CM | POA: Diagnosis not present

## 2023-01-25 DIAGNOSIS — Z79899 Other long term (current) drug therapy: Secondary | ICD-10-CM

## 2023-01-25 DIAGNOSIS — Z950 Presence of cardiac pacemaker: Secondary | ICD-10-CM | POA: Diagnosis not present

## 2023-01-25 DIAGNOSIS — Z7901 Long term (current) use of anticoagulants: Secondary | ICD-10-CM

## 2023-01-25 DIAGNOSIS — N1832 Chronic kidney disease, stage 3b: Secondary | ICD-10-CM

## 2023-01-25 DIAGNOSIS — I495 Sick sinus syndrome: Secondary | ICD-10-CM | POA: Diagnosis not present

## 2023-01-25 DIAGNOSIS — I129 Hypertensive chronic kidney disease with stage 1 through stage 4 chronic kidney disease, or unspecified chronic kidney disease: Secondary | ICD-10-CM

## 2023-01-25 DIAGNOSIS — I110 Hypertensive heart disease with heart failure: Secondary | ICD-10-CM

## 2023-01-25 MED ORDER — CARVEDILOL 12.5 MG PO TABS: 12.5000 mg | ORAL_TABLET | Freq: Two times a day (BID) | ORAL | 3 refills | Status: DC

## 2023-01-25 NOTE — Addendum Note (Signed)
Addended by: Roxanne Mins I on: 01/25/2023 09:39 AM   Modules accepted: Orders

## 2023-01-25 NOTE — Patient Instructions (Signed)
Medication Instructions:  Your physician has recommended you make the following change in your medication:   START: Carvedilol 12.5 mg twice daily  *If you need a refill on your cardiac medications before your next appointment, please call your pharmacy*   Lab Work: Your physician recommends that you return for lab work in:   Labs today: TSH T3 T4  If you have labs (blood work) drawn today and your tests are completely normal, you will receive your results only by: MyChart Message (if you have MyChart) OR A paper copy in the mail If you have any lab test that is abnormal or we need to change your treatment, we will call you to review the results.   Testing/Procedures: Your physician has requested that you have an echocardiogram. Echocardiography is a painless test that uses sound waves to create images of your heart. It provides your doctor with information about the size and shape of your heart and how well your heart's chambers and valves are working. This procedure takes approximately one hour. There are no restrictions for this procedure. Please do NOT wear cologne, perfume, aftershave, or lotions (deodorant is allowed). Please arrive 15 minutes prior to your appointment time.    [facility] [address] [place], [place] [address] Phone:  [phone]    Please arrive 15 minutes prior to your appointment time for registration and insurance purposes.  The test will take approximately 3 to 4 hours to complete; you may bring reading material.  If someone comes with you to your appointment, they will need to remain in the main lobby due to limited space in the testing area. **If you are pregnant or breastfeeding, please notify the nuclear lab prior to your appointment**  How to prepare for your Myocardial Perfusion Test: Do not eat or drink 3 hours prior to your test, except you may have water. Do not consume products containing caffeine (regular or  decaffeinated) 12 hours prior to your test. (ex: coffee, chocolate, sodas, tea). Do bring a list of your current medications with you.  If not listed below, you may take your medications as normal. HOLD diabetic medication/insulin the morning of the test: Jardiance, Take half of long acting insulin the night before test: Tujeo and Trulicity Do wear comfortable clothes (no dresses or overalls) and walking shoes, tennis shoes preferred (No heels or open toe shoes are allowed). Do NOT wear cologne, perfume, aftershave, or lotions (deodorant is allowed). If these instructions are not followed, your test will have to be rescheduled.  Please report to [address] for your test.  If you have questions or concerns about your appointment, you can call the [facility]  Nuclear Imaging Lab at [phone].  If you cannot keep your appointment, please provide 24 hours notification to the Nuclear Lab, to avoid a possible $50 charge to your account.    Follow-Up: At [facility], you and your health needs are our priority.  As part of our continuing mission to provide you with exceptional heart care, we have created designated Provider Care Teams.  These Care Teams include your primary Cardiologist (physician) and Advanced Practice Providers (APPs -  Physician Assistants and Nurse Practitioners) who all work together to provide you with the care you need, when you need it.  We recommend signing up for the patient portal called "MyChart".  Sign up information is provided on this After Visit Summary.  MyChart is used to connect with patients for Virtual Visits (  Telemedicine).  Patients are able to view lab/test results, encounter notes, upcoming appointments, etc.  Non-urgent messages can be sent to your provider as well.   To learn more about what you can do with MyChart, go to [email or web address].    Your next appointment:   6 month(s)  Provider:   Norman Herrlich, MD     Other Instructions None

## 2023-01-26 ENCOUNTER — Telehealth (HOSPITAL_COMMUNITY): Payer: Self-pay | Admitting: *Deleted

## 2023-01-26 LAB — TSH+T4F+T3FREE
Free T4: 2.13 ng/dL — ABNORMAL HIGH (ref 0.82–1.77)
T3, Free: 2.8 pg/mL (ref 2.0–4.4)
TSH: 0.889 u[IU]/mL (ref 0.450–4.500)

## 2023-01-26 NOTE — Telephone Encounter (Signed)
Patient given detailed instructions per Myocardial Perfusion Study Information Sheet for the test on 01/27/23 Patient notified to arrive 15 minutes early and that it is imperative to arrive on time for appointment to keep from having the test rescheduled.  If you need to cancel or reschedule your appointment, please call the office within 24 hours of your appointment. . Patient verbalized understanding. Louis Neal Jacqueline   

## 2023-01-27 ENCOUNTER — Ambulatory Visit: Payer: Medicare PPO | Attending: Cardiology

## 2023-01-27 DIAGNOSIS — I129 Hypertensive chronic kidney disease with stage 1 through stage 4 chronic kidney disease, or unspecified chronic kidney disease: Secondary | ICD-10-CM

## 2023-01-27 DIAGNOSIS — Z950 Presence of cardiac pacemaker: Secondary | ICD-10-CM | POA: Diagnosis not present

## 2023-01-27 DIAGNOSIS — Z79899 Other long term (current) drug therapy: Secondary | ICD-10-CM

## 2023-01-27 DIAGNOSIS — I495 Sick sinus syndrome: Secondary | ICD-10-CM

## 2023-01-27 DIAGNOSIS — I480 Paroxysmal atrial fibrillation: Secondary | ICD-10-CM

## 2023-01-27 DIAGNOSIS — Z7901 Long term (current) use of anticoagulants: Secondary | ICD-10-CM

## 2023-01-27 DIAGNOSIS — I110 Hypertensive heart disease with heart failure: Secondary | ICD-10-CM

## 2023-01-27 DIAGNOSIS — N1832 Chronic kidney disease, stage 3b: Secondary | ICD-10-CM

## 2023-01-27 LAB — MYOCARDIAL PERFUSION IMAGING
LV dias vol: 147 mL (ref 62–150)
LV sys vol: 68 mL
Nuc Stress EF: 54 %
Peak HR: 63 {beats}/min
Rest HR: 62 {beats}/min
Rest Nuclear Isotope Dose: 10.3 mCi
SDS: 4
SRS: 13
SSS: 17
Stress Nuclear Isotope Dose: 32.9 mCi
TID: 1.08

## 2023-01-27 MED ORDER — TECHNETIUM TC 99M TETROFOSMIN IV KIT
32.9000 | PACK | Freq: Once | INTRAVENOUS | Status: AC | PRN
  Administered 2023-01-27: 32.9 via INTRAVENOUS

## 2023-01-27 MED ORDER — TECHNETIUM TC 99M TETROFOSMIN IV KIT
10.3000 | PACK | Freq: Once | INTRAVENOUS | Status: AC | PRN
  Administered 2023-01-27: 10.3 via INTRAVENOUS

## 2023-01-27 MED ORDER — REGADENOSON 0.4 MG/5ML IV SOLN
0.4000 mg | Freq: Once | INTRAVENOUS | Status: AC
Start: 2023-01-27 — End: 2023-01-27
  Administered 2023-01-27: 0.4 mg via INTRAVENOUS

## 2023-02-01 NOTE — Addendum Note (Signed)
Addended by: Norman Herrlich on: 02/01/2023 12:49 PM   Modules accepted: Orders

## 2023-02-03 ENCOUNTER — Telehealth: Payer: Self-pay

## 2023-02-03 NOTE — Telephone Encounter (Signed)
Patient with diagnosis of afib on Eliquis for anticoagulation.    Procedure: Cataract and  Hydrus with Goniotomy  Date of procedure: 03/02/23   CHA2DS2-VASc Score = 4   This indicates a 4.8% annual risk of stroke. The patient's score is based upon: CHF History: 1 HTN History: 1 Diabetes History: 1 Stroke History: 0 Vascular Disease History: 0 Age Score: 1 Gender Score: 0      CrCl 35 ml/min  Per office protocol, patient can hold Eliquis for 2 days prior to procedure.    **This guidance is not considered finalized until pre-operative APP has relayed final recommendations.**

## 2023-02-03 NOTE — Telephone Encounter (Signed)
   Pre-operative Risk Assessment    Patient Name: Louis Neal  DOB: [birth year] MRN: [id number]      Request for Surgical Clearance    Procedure:   Cataract and  Hydrus with Goniotomy   Date of Surgery:  Clearance 03/02/23                                 Surgeon:  Dr. Sheffield Slider Surgeon's Group or Practice Name:  [facility] Phone number:  [phone]  Fax number:  [phone]   Type of Clearance Requested:   - Medical  - Pharmacy:  Hold Apixaban (Eliquis) 2 days prior   Type of Anesthesia:   IV sedation   Additional requests/questions:   Last in-office pacemaker evaluation  Signed, Eleonore Chiquito   02/03/2023, 8:37 AM

## 2023-02-03 NOTE — Telephone Encounter (Signed)
   Name: Louis Neal  DOB: 1951-01-11  MRN: [id number]   Primary Cardiologist: Norman Herrlich, MD  Chart reviewed as part of pre-operative protocol coverage. JAHZIEL LURRY was last seen on 01/25/2023 by Dr. Dulce Sellar.  He was doing well at that time.   Therefore, based on ACC/AHA guidelines, the patient would be at acceptable risk for the planned procedure without further cardiovascular testing.   Per pharm D, patient may hold Eliquis 2 days prior to procedure.   I will route this recommendation to the requesting party via Epic fax function and remove from pre-op pool. Please call with questions.  Carlos Levering, NP 02/03/2023, 5:04 PM

## 2023-02-03 NOTE — Telephone Encounter (Signed)
Dr. Dulce Sellar,  You saw this patient on 01/25/2023. Will you please comment on medical clearance for Cataract and Hydrus with Goniotomy?  Please route your response to P CV DIV Preop. I will communicate with requesting office once you have given recommendations.   Thank you!  Carlos Levering, NP

## 2023-02-05 ENCOUNTER — Ambulatory Visit (INDEPENDENT_AMBULATORY_CARE_PROVIDER_SITE_OTHER): Payer: Medicare PPO

## 2023-02-05 DIAGNOSIS — I495 Sick sinus syndrome: Secondary | ICD-10-CM | POA: Diagnosis not present

## 2023-02-05 LAB — CUP PACEART REMOTE DEVICE CHECK
Battery Remaining Longevity: 51 mo
Battery Remaining Percentage: 54 %
Battery Voltage: 2.98 V
Brady Statistic AP VP Percent: 70 %
Brady Statistic AP VS Percent: 1 %
Brady Statistic AS VP Percent: 30 %
Brady Statistic AS VS Percent: 1 %
Brady Statistic RA Percent Paced: 70 %
Brady Statistic RV Percent Paced: 99 %
Date Time Interrogation Session: 20240705020013
Implantable Lead Connection Status: 753985
Implantable Lead Connection Status: 753985
Implantable Lead Implant Date: 20200930
Implantable Lead Implant Date: 20200930
Implantable Lead Location: 753859
Implantable Lead Location: 753860
Implantable Pulse Generator Implant Date: 20200930
Lead Channel Impedance Value: 310 Ohm
Lead Channel Impedance Value: 460 Ohm
Lead Channel Pacing Threshold Amplitude: 0.75 V
Lead Channel Pacing Threshold Amplitude: 1.25 V
Lead Channel Pacing Threshold Pulse Width: 0.5 ms
Lead Channel Pacing Threshold Pulse Width: 0.5 ms
Lead Channel Sensing Intrinsic Amplitude: 1.7 mV
Lead Channel Sensing Intrinsic Amplitude: 3.6 mV
Lead Channel Setting Pacing Amplitude: 2 V
Lead Channel Setting Pacing Amplitude: 2.5 V
Lead Channel Setting Pacing Pulse Width: 0.5 ms
Lead Channel Setting Sensing Sensitivity: 2 mV
Pulse Gen Model: 2272
Pulse Gen Serial Number: 9159064

## 2023-02-15 ENCOUNTER — Encounter: Payer: Self-pay | Admitting: Cardiology

## 2023-02-15 ENCOUNTER — Ambulatory Visit: Payer: Medicare PPO | Attending: Cardiology | Admitting: Cardiology

## 2023-02-15 VITALS — BP 116/58 | HR 62 | Ht 70.0 in | Wt 221.0 lb

## 2023-02-15 DIAGNOSIS — I480 Paroxysmal atrial fibrillation: Secondary | ICD-10-CM

## 2023-02-15 DIAGNOSIS — I495 Sick sinus syndrome: Secondary | ICD-10-CM

## 2023-02-15 DIAGNOSIS — D6869 Other thrombophilia: Secondary | ICD-10-CM | POA: Diagnosis not present

## 2023-02-15 NOTE — Progress Notes (Signed)
PERIOPERATIVE PRESCRIPTION FOR IMPLANTED CARDIAC DEVICE PROGRAMMING  Patient Information: Name:  Louis Neal  DOB:  1951/07/06  MRN:  [id number]  Procedure:   Cataract and  Hydrus with Goniotomy    Date of Surgery:  Clearance 03/02/23                                 Surgeon:  Dr. Sheffield Slider Surgeon's Group or Practice Name:  [facility] Phone number:  [phone]          Fax number:  [phone]  Device Information:  Clinic EP Physician:  Loman Brooklyn, MD   Device Type:  Pacemaker Manufacturer and Phone #:  St. Jude/Abbott: [phone] Pacemaker Dependent?:  Yes.   Date of Last Device Check:  02/15/2023 Normal Device Function?:  Yes.    Electrophysiologist's Recommendations:  Have magnet available. Provide continuous ECG monitoring when magnet is used or reprogramming is to be performed.  Procedure may interfere with device function.  Magnet should be placed over device during procedure.  Per Device Clinic Standing Orders, Wiliam Ke, RN  11:02 AM 02/15/2023

## 2023-02-15 NOTE — Patient Instructions (Signed)

## 2023-02-15 NOTE — Progress Notes (Signed)
  Electrophysiology Office Note:   Date:  02/15/2023  ID:  Louis Neal, DOB 12-20-1950, MRN [id number]  Primary Cardiologist: Norman Herrlich, MD Electrophysiologist: Regan Lemming, MD      History of Present Illness:   Louis Neal is a 72 y.o. male with h/o atrial fibrillation, sick sinus syndrome, hypertension, CKD stage IV seen today for routine electrophysiology followup.  Since last being seen in our clinic the patient reports doing well.  He does have some shortness of breath, but has no other complaints.  He was found to be anemic and is being worked up by his nephrologist at [facility].  He has no acute cardiac complaints.  He did have a recent Myoview without ischemia.  he denies chest pain, palpitations, dyspnea, PND, orthopnea, nausea, vomiting, dizziness, syncope, edema, weight gain, or early satiety.   Review of systems complete and found to be negative unless listed in HPI.      EP Information / Studies Reviewed:    EKG is not ordered today. EKG from 01/25/23 reviewed which showed AV paced      PPM Interrogation-  reviewed in detail today,  See PACEART report.  Device History: Abbott Dual Chamber PPM implanted 05/03/2019 for Sinus Node Dysfunction  Risk Assessment/Calculations:    CHA2DS2-VASc Score = 4   This indicates a 4.8% annual risk of stroke. The patient's score is based upon: CHF History: 1 HTN History: 1 Diabetes History: 1 Stroke History: 0 Vascular Disease History: 0 Age Score: 1 Gender Score: 0             Physical Exam:   VS:  BP (!) 116/58   Pulse 62   Ht 5\' 10"  (1.778 m)   Wt 221 lb (100.2 kg)   SpO2 97%   BMI 31.71 kg/m    Wt Readings from Last 3 Encounters:  02/15/23 221 lb (100.2 kg)  01/27/23 213 lb (96.6 kg)  01/25/23 213 lb (96.6 kg)     GEN: Well nourished, well developed in no acute distress NECK: No JVD; No carotid bruits CARDIAC: Regular rate and rhythm, no murmurs, rubs, gallops RESPIRATORY:  Clear to  auscultation without rales, wheezing or rhonchi  ABDOMEN: Soft, non-tender, non-distended EXTREMITIES:  No edema; No deformity   ASSESSMENT AND PLAN:    1.  SND s/p Abbott PPM  Normal PPM function See Pace Art report No changes today  2.  Hypertension: Currently well-controlled  3.  Stage IV CKD: Followed at [facility]  4.  Paroxysmal atrial fibrillation: Currently on Eliquis and amiodarone.  Recent TSH and LFTs without abnormality.  In sinus rhythm.  5.  Secondary hypercoagulable state: Currently on Eliquis for atrial fibrillation  6.  Shortness of breath: Appears noncardiac in nature.  He is anemic and is being worked up by his nephrologist  Disposition:   Follow up with Dr. Elberta Fortis in 12 months  Signed, Earline Stiner Jorja Loa, MD

## 2023-02-18 ENCOUNTER — Ambulatory Visit: Payer: Medicare PPO | Attending: Cardiology

## 2023-02-18 DIAGNOSIS — N1832 Chronic kidney disease, stage 3b: Secondary | ICD-10-CM

## 2023-02-18 DIAGNOSIS — Z7901 Long term (current) use of anticoagulants: Secondary | ICD-10-CM | POA: Diagnosis not present

## 2023-02-18 DIAGNOSIS — I495 Sick sinus syndrome: Secondary | ICD-10-CM | POA: Diagnosis not present

## 2023-02-18 DIAGNOSIS — Z79899 Other long term (current) drug therapy: Secondary | ICD-10-CM

## 2023-02-18 DIAGNOSIS — I110 Hypertensive heart disease with heart failure: Secondary | ICD-10-CM

## 2023-02-18 DIAGNOSIS — Z950 Presence of cardiac pacemaker: Secondary | ICD-10-CM | POA: Diagnosis not present

## 2023-02-18 DIAGNOSIS — I129 Hypertensive chronic kidney disease with stage 1 through stage 4 chronic kidney disease, or unspecified chronic kidney disease: Secondary | ICD-10-CM

## 2023-02-18 DIAGNOSIS — I480 Paroxysmal atrial fibrillation: Secondary | ICD-10-CM | POA: Diagnosis not present

## 2023-02-18 LAB — ECHOCARDIOGRAM COMPLETE
Area-P 1/2: 3.65 cm2
MV M vel: 5.19 m/s
MV Peak grad: 107.7 mmHg
Radius: 0.5 cm
S' Lateral: 3.2 cm

## 2023-02-23 NOTE — Progress Notes (Signed)
Remote pacemaker transmission.   

## 2023-02-24 ENCOUNTER — Other Ambulatory Visit: Payer: Self-pay

## 2023-02-24 MED ORDER — AMIODARONE HCL 200 MG PO TABS: 200.0000 mg | ORAL_TABLET | Freq: Every day | ORAL | 3 refills | Status: DC

## 2023-04-08 ENCOUNTER — Other Ambulatory Visit: Payer: Self-pay | Admitting: Cardiology

## 2023-04-08 DIAGNOSIS — I480 Paroxysmal atrial fibrillation: Secondary | ICD-10-CM

## 2023-04-08 NOTE — Telephone Encounter (Signed)
Prescription refill request for Eliquis received. Indication: Afib  Last office visit: 02/15/23 ([place])  Scr: 2.06 (03/25/23)  Age: 72 Weight: 100.2kg  Appropriate dose. Refill sent.

## 2023-05-07 ENCOUNTER — Ambulatory Visit (INDEPENDENT_AMBULATORY_CARE_PROVIDER_SITE_OTHER): Payer: Medicare PPO

## 2023-05-07 DIAGNOSIS — I495 Sick sinus syndrome: Secondary | ICD-10-CM

## 2023-05-08 LAB — CUP PACEART REMOTE DEVICE CHECK
Battery Remaining Longevity: 49 mo
Battery Remaining Percentage: 51 %
Battery Voltage: 2.98 V
Brady Statistic AP VP Percent: 67 %
Brady Statistic AP VS Percent: 1 %
Brady Statistic AS VP Percent: 33 %
Brady Statistic AS VS Percent: 1 %
Brady Statistic RA Percent Paced: 66 %
Brady Statistic RV Percent Paced: 99 %
Date Time Interrogation Session: 20241004020014
Implantable Lead Connection Status: 753985
Implantable Lead Connection Status: 753985
Implantable Lead Implant Date: 20200930
Implantable Lead Implant Date: 20200930
Implantable Lead Location: 753859
Implantable Lead Location: 753860
Implantable Pulse Generator Implant Date: 20200930
Lead Channel Impedance Value: 330 Ohm
Lead Channel Impedance Value: 480 Ohm
Lead Channel Pacing Threshold Amplitude: 0.75 V
Lead Channel Pacing Threshold Amplitude: 1.25 V
Lead Channel Pacing Threshold Pulse Width: 0.5 ms
Lead Channel Pacing Threshold Pulse Width: 0.8 ms
Lead Channel Sensing Intrinsic Amplitude: 3.6 mV
Lead Channel Sensing Intrinsic Amplitude: 4.6 mV
Lead Channel Setting Pacing Amplitude: 2 V
Lead Channel Setting Pacing Amplitude: 2.5 V
Lead Channel Setting Pacing Pulse Width: 0.5 ms
Lead Channel Setting Sensing Sensitivity: 2 mV
Pulse Gen Model: 2272
Pulse Gen Serial Number: 9159064

## 2023-05-19 NOTE — Progress Notes (Signed)
Remote pacemaker transmission.   

## 2023-06-30 DIAGNOSIS — K120 Recurrent oral aphthae: Secondary | ICD-10-CM

## 2023-06-30 HISTORY — DX: Recurrent oral aphthae: K12.0

## 2023-08-06 ENCOUNTER — Ambulatory Visit (INDEPENDENT_AMBULATORY_CARE_PROVIDER_SITE_OTHER): Payer: Medicare PPO

## 2023-08-06 DIAGNOSIS — I495 Sick sinus syndrome: Secondary | ICD-10-CM

## 2023-08-06 LAB — CUP PACEART REMOTE DEVICE CHECK
Battery Remaining Longevity: 43 mo
Battery Remaining Percentage: 48 %
Battery Voltage: 2.98 V
Brady Statistic AP VP Percent: 64 %
Brady Statistic AP VS Percent: 1 %
Brady Statistic AS VP Percent: 36 %
Brady Statistic AS VS Percent: 1 %
Brady Statistic RA Percent Paced: 63 %
Brady Statistic RV Percent Paced: 99 %
Date Time Interrogation Session: 20250103020013
Implantable Lead Connection Status: 753985
Implantable Lead Connection Status: 753985
Implantable Lead Implant Date: 20200930
Implantable Lead Implant Date: 20200930
Implantable Lead Location: 753859
Implantable Lead Location: 753860
Implantable Pulse Generator Implant Date: 20200930
Lead Channel Impedance Value: 310 Ohm
Lead Channel Impedance Value: 450 Ohm
Lead Channel Pacing Threshold Amplitude: 0.75 V
Lead Channel Pacing Threshold Amplitude: 1.25 V
Lead Channel Pacing Threshold Pulse Width: 0.5 ms
Lead Channel Pacing Threshold Pulse Width: 0.8 ms
Lead Channel Sensing Intrinsic Amplitude: 2 mV
Lead Channel Sensing Intrinsic Amplitude: 3.6 mV
Lead Channel Setting Pacing Amplitude: 2 V
Lead Channel Setting Pacing Amplitude: 2.5 V
Lead Channel Setting Pacing Pulse Width: 0.5 ms
Lead Channel Setting Sensing Sensitivity: 2 mV
Pulse Gen Model: 2272
Pulse Gen Serial Number: 9159064

## 2023-08-25 ENCOUNTER — Encounter: Payer: Self-pay | Admitting: Cardiology

## 2023-08-25 NOTE — Progress Notes (Unsigned)
Cardiology Office Note:    Date:  08/26/2023   ID:  Louis Neal, DOB August 11, 1950, MRN [id number]  PCP:  Louis Bass, MD he predominately has labs done in your office please be sure to do a TSH with labs with amiodarone therapy Cardiologist:  Louis Herrlich, MD    Referring MD: Louis Bass, MD    ASSESSMENT:    1. SSS (sick sinus syndrome) (HCC)   2. Pacemaker - STJ   3. PAF (paroxysmal atrial fibrillation) (HCC)   4. On amiodarone therapy   5. Chronic anticoagulation   6. Hypertensive heart disease with heart failure (HCC)   7. Hypertensive kidney disease with stage 3b chronic kidney disease (HCC)   8. Hyperlipidemia, mixed    PLAN:    In order of problems listed above:  From a cardiac perspective Louis Neal is done well since pacemaker insertion he has had no symptoms of bradycardia and no breakthrough episodes of atrial fibrillation Continue his amiodarone I remind his PCP to do a TSH with his lab is every 6 months Continue his current anticoagulant Heart failure is nicely compensated continue his current loop diuretic and blood pressure is at target with combination of medications including carvedilol which she is able to tolerate now that he has a backup pacemaker hydralazine and his loop diuretic.  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  Concerns regarding medicines are outlined above.  Orders Placed This Encounter  Procedures   EKG 12-Lead   No orders of the defined types were placed in this encounter.    History of Present Illness:    Louis Neal is a 73 y.o. male with a hx of sick sinus syndrome permanent  dual-chamber pacemaker for bradycardia paroxysmal atrial fibrillation suppressed with low-dose amiodarone chronic anticoagulation hypertensive heart disease with heart failure and stage IV CKD followed by [facility] nephrology hyperlipidemia and anemia of chronic disease last seen 01/25/2023.  Labs 07/19/2023 hemoglobin 10.9 platelets 262,000 hemoglobin A1c 6.3% CMP with sodium 135 potassium 3.6 creatinine 105 GFR 33 cc/min lipid profile total cholesterol 90 LDL 24 non-HDL cholesterol 46 triglycerides 137.  Recent device check 08/17/2023 shows a projected battery life of 43 months he has 100% ventricularly paced.  Compliance with diet, lifestyle and medications: Yes  He continues to do well pleased with the quality of his life kidney function is a bit better blood pressure at home runs 120s to 130s systolic and is not having edema orthopnea chest pain shortness of breath palpitation or syncope he tolerates his anticoagulant without bleeding but he does have proximal muscle weakness it may be related to his high intensity statin he is can withdraw for 1 month if improved we could consider using a PCSK9 inhibitor or bempedoic acid and if no improvement I would place him back on his statin.  He said he contact me through MyChart. Past Medical History:  Diagnosis Date   Acute gout of left foot 07/07/2022   Acute idiopathic gout of right ankle 06/06/2020   Anemia due to stage 3b chronic kidney disease (HCC) 05/11/2019   Formatting of this note might be different from the original.  Updated from 03/08/2019 [facility]     Anemia due to stage 4 chronic kidney disease (HCC) 05/11/2019   Formatting of this note might be different from the original. 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 Updated per 03/08/2019 [facility]   Bilateral lower extremity edema 02/28/2016   Cardiorenal syndrome with renal failure 11/14/2018   Chronic anticoagulation 08/31/2017   CKD (chronic kidney disease) stage 3, GFR 30-59 ml/min (HCC) 11/30/2017   Degeneration of intervertebral disc of lumbar region 07/22/2012   Degenerative disc disease, lumbar 07/22/2012   Dermatitis 08/24/2022   Formatting of this note might be different from the original. 08/24/2022: arms Formatting of this note might be different from the original. 08/24/2022: arms     Diabetes mellitus without complication (HCC)    Diabetic ulcer of right fifth toe (HCC) 09/18/2019   Diabetic ulcer of right midfoot associated with type 2 diabetes mellitus, with fat layer exposed (HCC) 03/03/2022   Diaphragmatic hernia 09/04/2016   Elevated brain natriuretic peptide (BNP) level 10/26/2017   Erectile dysfunction 02/04/2021   Formatting of this note might be different from the original.  02/04/2021: likely vascular/DM, with CKD4 limit sildenafil to 20 mg     Essential hypertension 01/14/2016   Formatting of this note might be different from the original. 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 2021: EGD   Gastroesophageal reflux disease without esophagitis 11/28/2015   Overview:  Managed GI  Formatting of this note might be different from the original. Managed GI   GERD (gastroesophageal reflux disease)    Glaucoma 02/28/2016   High risk medication use 01/14/2016   History of diabetic ulcer of foot 06/29/2017   Hyperkalemia 03/08/2018   Hyperlipidemia, mixed 02/28/2016   Hypertension    dr  Louis Neal  in [place]   Hypertensive heart disease with heart failure (HCC) 01/14/2016   Hypotension arterial 01/14/2016   Low back pain 07/22/2012   Lumbosacral radiculopathy 07/22/2012   Metabolic bone disease 12/15/2017   Microalbuminuria 12/15/2017   Mobitz type 1 second degree atrioventricular block 10/14/2018   Obstructive sleep apnea 02/28/2016   Obstructive sleep apnea on CPAP 02/28/2016   Formatting of this note might be different from the original. Managed PULM 2019: CPAP, new   Olecranon bursitis 01/13/2017   Overview:  2018: left   On amiodarone therapy 10/14/2018   Onychomycosis 10/03/2019   Orthostatic hypotension 01/14/2016   Pacemaker - STJ 11/10/2021   PAF (paroxysmal atrial fibrillation) (HCC) 03/02/2017   Persistent atrial fibrillation (HCC)    Persistent proteinuria 12/15/2017   Pre-ulcerative calluses 10/17/2015   Renal artery stenosis (HCC) 02/28/2016   SSS (sick sinus syndrome) (HCC) 04/02/2017   Stucco keratoses 06/23/2016   Tachycardia-bradycardia syndrome (HCC) 04/02/2017   Type 2 diabetes mellitus with stage 3b chronic kidney disease, with long-term current use of insulin (HCC) 05/11/2019   Formatting of this note might be different from the original.  Per  8/5//2020 [facility]     Type 2 diabetes mellitus, with long-term current use of insulin (HCC) 02/28/2016   Formatting of this note might be different from the original. Per 8/5//2020 [facility]   Ulcer of foot, right, limited to breakdown of skin (HCC) 03/29/2018   Ulcer of right foot, with fat layer exposed (HCC) 08/22/2019   Vitamin D deficiency 12/15/2017   Wellness examination 07/08/2018    Current Medications: Current Meds  Medication Sig   ACCU-CHEK AVIVA PLUS test strip 1 each by Other route 4 (four) times daily.   amiodarone (PACERONE) 200 MG tablet Take 1 tablet (200 mg total) by mouth daily.   ammonium lactate (LAC-HYDRIN) 12 % lotion Apply 1 application topically 2 (two)  times daily. Applied to feet   carvedilol (COREG) 12.5 MG tablet Take 1 tablet (12.5 mg total) by mouth 2 (two) times daily.   Cholecalciferol (VITAMIN D) 2000 units tablet Take 2,000 Units by mouth daily with breakfast.    clobetasol cream (TEMOVATE) 0.05 % Apply 1 Application topically 2 (two) times daily as needed (rash).   Continuous Glucose Sensor (FREESTYLE LIBRE 3 SENSOR) MISC Apply 1 Device topically every 14 (fourteen) days.   ELIQUIS 5 MG TABS tablet TAKE ONE TABLET BY MOUTH TWICE DAILY   epoetin alfa (EPOGEN) 20000 UNIT/ML injection Inject 20,000 Units into the skin every 14 (fourteen) days.   esomeprazole (NEXIUM) 40 MG capsule Take 40 mg by mouth daily before breakfast.    febuxostat (ULORIC) 40 MG tablet Take 1 tablet by mouth daily.   ferrous sulfate 325 (65 FE) MG tablet Take 325 mg by mouth daily with breakfast.   hydrALAZINE (APRESOLINE) 100 MG tablet Take 100 mg by mouth 3 (three) times daily.    insulin glargine, 1 Unit Dial, (TOUJEO SOLOSTAR) 300 UNIT/ML Solostar Pen Inject 80 Units into the skin daily.   JARDIANCE 25 MG TABS tablet Take 25 mg by mouth daily.   latanoprost (XALATAN) 0.005 % ophthalmic solution Place 1 drop into both eyes at bedtime.    mupirocin ointment (BACTROBAN) 2 % Apply 1 application topically 2 (two) times daily as needed for wound care.   omega-3 acid ethyl esters (LOVAZA) 1 g capsule Take 2 g by mouth 2 (two) times daily.   torsemide (DEMADEX) 20 MG tablet Take 20 mg by mouth 2 (two) times daily.   TRULICITY 4.5 MG/0.5ML SOPN Inject 4.5 mg into the skin once a week.   vitamin B-12 (CYANOCOBALAMIN) 1000 MCG tablet Take 1,000 mcg by mouth daily.   [DISCONTINUED] rosuvastatin (CRESTOR) 10 MG tablet Take 10 mg by mouth daily with supper.       EKGs/Labs/Other Studies Reviewed:    The following studies were reviewed today:  EKG Interpretation Date/Time:  Thursday August 26 2023 08:14:31 EST Ventricular Rate:  63 PR Interval:  198 QRS  Duration:  186 QT Interval:  548 QTC Calculation: 560 R Axis:   -60  Text Interpretation: AV dual-paced rhythm When compared with ECG of 21-Jun-2020 10:49, No significant change was found Confirmed by Louis Neal ([id number]) on 08/26/2023 8:19:11 AM      Physical Exam:    VS:  BP (!) 140/58   Pulse 63   Ht 5\' 10"  (1.778 m)   Wt 218 lb 9.6 oz (99.2 kg)   SpO2 99%   BMI 31.37 kg/m     Wt Readings from Last 3 Encounters:  08/26/23 218 lb 9.6 oz (99.2 kg)  02/15/23 221 lb (100.2  kg)  01/27/23 213 lb (96.6 kg)     GEN:  Well nourished, well developed in no acute distress HEENT: Normal NECK: No JVD; No carotid bruits LYMPHATICS: No lymphadenopathy CARDIAC: RRR, no murmurs, rubs, gallops RESPIRATORY:  Clear to auscultation without rales, wheezing or rhonchi  ABDOMEN: Soft, non-tender, non-distended MUSCULOSKELETAL:  No edema; No deformity  SKIN: Warm and dry NEUROLOGIC:  Alert and oriented x 3 PSYCHIATRIC:  Normal affect    Signed, Louis Herrlich, MD  08/26/2023 8:43 AM    [facility]

## 2023-08-26 ENCOUNTER — Ambulatory Visit: Payer: Medicare PPO | Attending: Cardiology | Admitting: Cardiology

## 2023-08-26 ENCOUNTER — Encounter: Payer: Self-pay | Admitting: Cardiology

## 2023-08-26 VITALS — BP 140/58 | HR 63 | Ht 70.0 in | Wt 218.6 lb

## 2023-08-26 DIAGNOSIS — I480 Paroxysmal atrial fibrillation: Secondary | ICD-10-CM | POA: Diagnosis not present

## 2023-08-26 DIAGNOSIS — I110 Hypertensive heart disease with heart failure: Secondary | ICD-10-CM

## 2023-08-26 DIAGNOSIS — Z7901 Long term (current) use of anticoagulants: Secondary | ICD-10-CM

## 2023-08-26 DIAGNOSIS — Z950 Presence of cardiac pacemaker: Secondary | ICD-10-CM | POA: Diagnosis not present

## 2023-08-26 DIAGNOSIS — Z79899 Other long term (current) drug therapy: Secondary | ICD-10-CM | POA: Diagnosis not present

## 2023-08-26 DIAGNOSIS — I495 Sick sinus syndrome: Secondary | ICD-10-CM | POA: Diagnosis not present

## 2023-08-26 DIAGNOSIS — E782 Mixed hyperlipidemia: Secondary | ICD-10-CM

## 2023-08-26 DIAGNOSIS — N1832 Chronic kidney disease, stage 3b: Secondary | ICD-10-CM

## 2023-08-26 DIAGNOSIS — I129 Hypertensive chronic kidney disease with stage 1 through stage 4 chronic kidney disease, or unspecified chronic kidney disease: Secondary | ICD-10-CM

## 2023-08-26 NOTE — Patient Instructions (Signed)
Medication Instructions:  Your physician has recommended you make the following change in your medication:   STOP: Rosuvastatin  *If you need a refill on your cardiac medications before your next appointment, please call your pharmacy*   Lab Work: None If you have labs (blood work) drawn today and your tests are completely normal, you will receive your results only by: MyChart Message (if you have MyChart) OR A paper copy in the mail If you have any lab test that is abnormal or we need to change your treatment, we will call you to review the results.   Testing/Procedures: None   Follow-Up: At [facility], you and your health needs are our priority.  As part of our continuing mission to provide you with exceptional heart care, we have created designated Provider Care Teams.  These Care Teams include your primary Cardiologist (physician) and Advanced Practice Providers (APPs -  Physician Assistants and Nurse Practitioners) who all work together to provide you with the care you need, when you need it.  We recommend signing up for the patient portal called "MyChart".  Sign up information is provided on this After Visit Summary.  MyChart is used to connect with patients for Virtual Visits (Telemedicine).  Patients are able to view lab/test results, encounter notes, upcoming appointments, etc.  Non-urgent messages can be sent to your provider as well.   To learn more about what you can do with MyChart, go to [email or web address].    Your next appointment:   6 month(s)  Provider:   Norman Herrlich, MD    Other Instructions Call or send Dr. Dulce Sellar a MyChart message in 4 weeks regarding your improvement after stopping Rosuvastatin.

## 2023-09-13 NOTE — Progress Notes (Signed)
 Remote pacemaker transmission.

## 2023-09-25 ENCOUNTER — Encounter: Payer: Self-pay | Admitting: Cardiology

## 2023-09-25 DIAGNOSIS — E782 Mixed hyperlipidemia: Secondary | ICD-10-CM

## 2023-09-27 ENCOUNTER — Encounter: Payer: Self-pay | Admitting: Cardiology

## 2023-09-27 MED ORDER — NEXLETOL 180 MG PO TABS: 1.0000 | ORAL_TABLET | Freq: Every day | ORAL | 3 refills | Status: DC

## 2023-09-28 ENCOUNTER — Telehealth: Payer: Self-pay | Admitting: Pharmacy Technician

## 2023-09-28 ENCOUNTER — Other Ambulatory Visit (HOSPITAL_COMMUNITY): Payer: Self-pay

## 2023-09-28 ENCOUNTER — Other Ambulatory Visit: Payer: Self-pay

## 2023-09-28 DIAGNOSIS — I480 Paroxysmal atrial fibrillation: Secondary | ICD-10-CM

## 2023-09-28 MED ORDER — APIXABAN 5 MG PO TABS: 5.0000 mg | ORAL_TABLET | Freq: Two times a day (BID) | ORAL | 0 refills | Status: DC

## 2023-09-28 NOTE — Telephone Encounter (Signed)
 Pharmacy Patient Advocate Encounter  Received notification from [facility] that Prior Authorization for Nexletol has been APPROVED from 09/28/23 to 08/02/24. Ran test claim, Copay is $128.00- 3 months. This test claim was processed through [facility]- copay amounts may vary at other pharmacies due to pharmacy/plan contracts, or as the patient moves through the different stages of their insurance plan.   PA #/Case ID/Reference #: [id number]

## 2023-09-28 NOTE — Telephone Encounter (Signed)
 Pharmacy Patient Advocate Encounter   Received notification from CoverMyMeds that prior authorization for nexletol is required/requested.   Insurance verification completed.   The patient is insured through [employer] .   Per test claim: PA required; PA submitted to above mentioned insurance via CoverMyMeds Key/confirmation #/EOC [facility] Status is pending

## 2023-09-28 NOTE — Telephone Encounter (Signed)
 Prescription refill request for Eliquis received. Indication:AFIB Last office visit:1/25 ZOX:WRUEA labs Age: 73 Weight:99.2  kg  Prescription refilled

## 2023-11-05 ENCOUNTER — Ambulatory Visit (INDEPENDENT_AMBULATORY_CARE_PROVIDER_SITE_OTHER): Payer: Medicare PPO

## 2023-11-05 DIAGNOSIS — I495 Sick sinus syndrome: Secondary | ICD-10-CM

## 2023-11-05 LAB — CUP PACEART REMOTE DEVICE CHECK
Battery Remaining Longevity: 42 mo
Battery Remaining Percentage: 45 %
Battery Voltage: 2.98 V
Brady Statistic AP VP Percent: 67 %
Brady Statistic AP VS Percent: 1 %
Brady Statistic AS VP Percent: 33 %
Brady Statistic AS VS Percent: 1 %
Brady Statistic RA Percent Paced: 66 %
Brady Statistic RV Percent Paced: 99 %
Date Time Interrogation Session: 20250404020013
Implantable Lead Connection Status: 753985
Implantable Lead Connection Status: 753985
Implantable Lead Implant Date: 20200930
Implantable Lead Implant Date: 20200930
Implantable Lead Location: 753859
Implantable Lead Location: 753860
Implantable Pulse Generator Implant Date: 20200930
Lead Channel Impedance Value: 360 Ohm
Lead Channel Impedance Value: 460 Ohm
Lead Channel Pacing Threshold Amplitude: 0.75 V
Lead Channel Pacing Threshold Amplitude: 1.25 V
Lead Channel Pacing Threshold Pulse Width: 0.5 ms
Lead Channel Pacing Threshold Pulse Width: 0.8 ms
Lead Channel Sensing Intrinsic Amplitude: 3.6 mV
Lead Channel Sensing Intrinsic Amplitude: 4 mV
Lead Channel Setting Pacing Amplitude: 2 V
Lead Channel Setting Pacing Amplitude: 2.5 V
Lead Channel Setting Pacing Pulse Width: 0.5 ms
Lead Channel Setting Sensing Sensitivity: 2 mV
Pulse Gen Model: 2272
Pulse Gen Serial Number: 9159064

## 2023-11-08 ENCOUNTER — Ambulatory Visit (INDEPENDENT_AMBULATORY_CARE_PROVIDER_SITE_OTHER): Payer: Medicare PPO

## 2023-11-08 DIAGNOSIS — I495 Sick sinus syndrome: Secondary | ICD-10-CM

## 2023-11-12 ENCOUNTER — Other Ambulatory Visit: Payer: Self-pay

## 2023-11-12 DIAGNOSIS — I480 Paroxysmal atrial fibrillation: Secondary | ICD-10-CM

## 2023-11-12 MED ORDER — APIXABAN 5 MG PO TABS: 5.0000 mg | ORAL_TABLET | Freq: Two times a day (BID) | ORAL | 1 refills | Status: DC

## 2023-11-12 NOTE — Telephone Encounter (Signed)
 Prescription refill request for Eliquis received. Indication:afib Last office visit:1/25 Scr:2.39  2/25 Age: 73 Weight:99.2  kg  Prescription refilled

## 2023-11-18 ENCOUNTER — Telehealth: Payer: Self-pay | Admitting: Cardiology

## 2023-11-18 NOTE — Telephone Encounter (Signed)
 Pt was told the have lab work done again around this time. He has orders for CBC with Diff, BMET, and Lipid. He states he had labs drawn at PCP last week (11/12/23 - available via Epic). He asked if those were okay or did he need anything else drawn. Please advise.

## 2023-12-16 NOTE — Progress Notes (Signed)
 Remote pacemaker transmission.

## 2023-12-28 ENCOUNTER — Other Ambulatory Visit: Payer: Self-pay | Admitting: Cardiology

## 2023-12-28 NOTE — Progress Notes (Signed)
 Remote pacemaker transmission.

## 2024-01-26 ENCOUNTER — Other Ambulatory Visit: Payer: Self-pay | Admitting: Cardiology

## 2024-02-07 ENCOUNTER — Ambulatory Visit (INDEPENDENT_AMBULATORY_CARE_PROVIDER_SITE_OTHER): Payer: Medicare PPO

## 2024-02-07 DIAGNOSIS — I495 Sick sinus syndrome: Secondary | ICD-10-CM | POA: Diagnosis not present

## 2024-02-09 LAB — CUP PACEART REMOTE DEVICE CHECK
Battery Remaining Longevity: 38 mo
Battery Remaining Percentage: 42 %
Battery Voltage: 2.96 V
Brady Statistic AP VP Percent: 66 %
Brady Statistic AP VS Percent: 1 %
Brady Statistic AS VP Percent: 34 %
Brady Statistic AS VS Percent: 1 %
Brady Statistic RA Percent Paced: 66 %
Brady Statistic RV Percent Paced: 99 %
Date Time Interrogation Session: 20250707020015
Implantable Lead Connection Status: 753985
Implantable Lead Connection Status: 753985
Implantable Lead Implant Date: 20200930
Implantable Lead Implant Date: 20200930
Implantable Lead Location: 753859
Implantable Lead Location: 753860
Implantable Pulse Generator Implant Date: 20200930
Lead Channel Impedance Value: 330 Ohm
Lead Channel Impedance Value: 510 Ohm
Lead Channel Pacing Threshold Amplitude: 0.75 V
Lead Channel Pacing Threshold Amplitude: 1.25 V
Lead Channel Pacing Threshold Pulse Width: 0.5 ms
Lead Channel Pacing Threshold Pulse Width: 0.8 ms
Lead Channel Sensing Intrinsic Amplitude: 2.4 mV
Lead Channel Sensing Intrinsic Amplitude: 4 mV
Lead Channel Setting Pacing Amplitude: 2 V
Lead Channel Setting Pacing Amplitude: 2.5 V
Lead Channel Setting Pacing Pulse Width: 0.5 ms
Lead Channel Setting Sensing Sensitivity: 2 mV
Pulse Gen Model: 2272
Pulse Gen Serial Number: 9159064

## 2024-02-22 ENCOUNTER — Other Ambulatory Visit: Payer: Self-pay

## 2024-02-23 ENCOUNTER — Ambulatory Visit

## 2024-02-23 ENCOUNTER — Ambulatory Visit: Admitting: Cardiology

## 2024-02-23 VITALS — BP 98/50 | HR 60 | Ht 70.0 in | Wt 216.2 lb

## 2024-02-23 DIAGNOSIS — I480 Paroxysmal atrial fibrillation: Secondary | ICD-10-CM | POA: Diagnosis not present

## 2024-02-23 DIAGNOSIS — E782 Mixed hyperlipidemia: Secondary | ICD-10-CM | POA: Diagnosis not present

## 2024-02-23 DIAGNOSIS — I10 Essential (primary) hypertension: Secondary | ICD-10-CM | POA: Diagnosis not present

## 2024-02-23 DIAGNOSIS — I5032 Chronic diastolic (congestive) heart failure: Secondary | ICD-10-CM | POA: Insufficient documentation

## 2024-02-23 DIAGNOSIS — I495 Sick sinus syndrome: Secondary | ICD-10-CM

## 2024-02-23 NOTE — Patient Instructions (Signed)

## 2024-02-23 NOTE — Progress Notes (Signed)
 Cardiology Consultation:    Date:  02/23/2024   ID:  Louis Neal, DOB March 12, 1951, MRN [id number]  PCP:  Louis Lamar CROME, MD  Cardiologist:  Louis SAUNDERS Eber Ferrufino, MD   Referring MD: Louis Lamar CROME, MD   Chief Complaint  Patient presents with   Follow-up     ASSESSMENT AND PLAN:   [name] 73 year old male with history of SSS s/p dual-chamber pacemaker, paroxysmal atrial fibrillation on amiodarone  for rhythm control, chronic diastolic CHF, hypertension, CKD stage IV, intolerant to high intensity statin, diabetes, GERD, OSA.  Stress test nuclear imaging 01/27/2023 no ischemia.  Last echocardiogram 02/18/2023 with normal biventricular function EF 60 to 65%, mild LVH, GLS -16%, RV function normal with normal size, mild MR, RVSP mildly elevated 38 mmHg.   Here for a routine follow-up visit. Problem List Items Addressed This Visit     Hyperlipidemia, mixed   Well-controlled on lipid panel from 11/12/2023 with HDL 40, LDL 61, triglycerides 202 and total cholesterol 128. Continue bempedoic acid  180 mg daily. Also on Lovaza .      PAF (paroxysmal atrial fibrillation) (HCC) - Primary   AV dual paced rhythm today. Device check normal pacemaker check 02/07/2024.  Continue with anticoagulation using Eliquis  5 mg twice daily.  On rhythm control with amiodarone  200 mg once daily, tolerating well, last thyroid  panel from 11/12/2023 reviewed.       Relevant Orders   EKG 12-Lead (Completed)   SSS (sick sinus syndrome) (HCC)   S/p permanent pacemaker continues to follow-up with device clinic and last device check 02/07/2024 was normal.       Essential hypertension   Well-controlled on current regimen with carvedilol , hydralazine  as reviewed above. Target below 130/80 mmHg. Advised him to continue to monitor blood pressures at home if persistently systolic blood pressure below 889, may explain his sense of tiredness. If persistently low blood pressures below 110 mmHg, will consider  titrating down hydralazine  dose to 50 mg 3 times daily.       Chronic diastolic CHF (congestive heart failure) (HCC)   Euvolemic, compensated. Trace bilateral ankle edema. NYHA class I, good functional status at home.  Continue torsemide  20 mg twice daily. Advised about salt restriction to less than 2 g/day. Advised to monitor weights at home Weight today here in the office 216 pounds.  Weight gain over 2 to 3 pounds in a day or 5 pounds in a week should prompt higher doses of torsemide  for couple days and advised him to reach out to our office in that setting.  Guideline directed medical therapy limited due to renal function. Continue with carvedilol  12.5 mg twice daily. Continue with hydralazine  100 mg 3 times daily. On Jardiance 25 mg once daily diabetic dose. Tolerating well. Continue the same.   Continues to follow-up with nephrology and recommended to keep close follow-ups as worsening renal function may prompt higher doses of torsemide  or discussion about renal replacement therapy.          History of Present Illness:    Louis Neal is a 73 y.o. male who is being seen today for follow-up visit. PCP is Dough, Lamar CROME, MD. Last visit with our office was 08/26/2023 with Louis. Monetta. Also follows up with Louis. Inocencio. Follows up with nephrologist Louis Neal  Pleasant man here for the visit by himself.  Keeps himself busy with day-to-day activities at home and lawn keeping.   Has history of SSS s/p dual-chamber pacemaker, paroxysmal atrial fibrillation on amiodarone  for  rhythm control, chronic diastolic CHF, hypertension, CKD stage IV, intolerant to high intensity statin, diabetes GERD, OSA.  Stress test nuclear imaging 01/27/2023 no ischemia.  Last echocardiogram 02/18/2023 with normal biventricular function EF 60 to 65%, mild LVH, GLS -16%, RV function normal with normal size, mild MR, RVSP mildly elevated 38 mmHg.  Mentions overall he has been doing well.  Feels his  energy levels may be gradually decreasing.  Denies any chest pain, shortness of breath, palpitation, lightheadedness, dizziness or syncopal episodes. No blood in urine or stools. No falls. Mild pedal edema at times but has not been significant recently. Mentions easily bruises.  Mentions mindful about salt intake in his diet. Good compliance with medications.  Does not routinely check blood pressures at home.  Mentions have been well-controlled.  EKG in the clinic today shows AV dual paced rhythm.  Last device check pacemaker 02/07/2024 normal function 100% ventricular paced 67% atrial paced.  Blood work from 11/12/2023 noted TSH normal 1.446.  Hemoglobin A1c 6.7 CBC with hemoglobin 11.5 and hematocrit 35.6, platelets 254, WBC 7. Lipid panel with total cholesterol 128, triglycerides 202, HDL 40 and LDL 61. Sodium 138, potassium 3.5, BUN 43 and creatinine 2.28, EGFR 29   Past Medical History:  Diagnosis Date   Acute gout of left foot 07/07/2022   Acute idiopathic gout of right ankle 06/06/2020   Anemia due to stage 3b chronic kidney disease (HCC) 05/11/2019   Formatting of this note might be different from the original.  Updated from 03/08/2019 [facility] Nephrology [facility]N     Anemia due to stage 4 chronic kidney disease (HCC) 05/11/2019   Formatting of this note might be different from the original. 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 Updated per 03/08/2019 [facility] Nephrology VN   Bilateral lower extremity edema 02/28/2016   Cardiorenal syndrome with renal failure  11/14/2018   Chronic anticoagulation 08/31/2017   CKD (chronic kidney disease) stage 3, GFR 30-59 ml/min (HCC) 11/30/2017   Degeneration of intervertebral disc of lumbar region 07/22/2012   Degenerative disc disease, lumbar 07/22/2012   Dermatitis 08/24/2022   Formatting of this note might be different from the original. 08/24/2022: arms Formatting of this note might be different from the original. 08/24/2022: arms     Diabetes mellitus without complication (HCC)    Diabetic ulcer of right fifth toe (HCC) 09/18/2019   Diabetic ulcer of right midfoot associated with type 2 diabetes mellitus, with fat layer exposed (HCC) 03/03/2022   Diaphragmatic hernia 09/04/2016   Elevated brain natriuretic peptide (BNP) level 10/26/2017   Erectile dysfunction 02/04/2021   Formatting of this note might be different from the original.  02/04/2021: likely vascular/DM, with CKD4 limit sildenafil to 20 mg     Essential hypertension 01/14/2016   Formatting of this note might be different from the original. 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 2021: EGD   Gastroesophageal reflux disease without esophagitis 11/28/2015   Overview:  Managed GI  Formatting of this note might be different from the original. Managed GI   GERD (gastroesophageal reflux disease)    Glaucoma 02/28/2016   High risk medication use 01/14/2016   History of diabetic ulcer of foot 06/29/2017   Hyperkalemia 03/08/2018   Hyperlipidemia, mixed 02/28/2016   Hypertension    Louis Neal  in [place]   Hypertensive heart disease with heart failure (HCC) 01/14/2016   Hypotension arterial 01/14/2016   Low back pain 07/22/2012   Lumbosacral radiculopathy 07/22/2012   Metabolic bone disease 12/15/2017   Microalbuminuria 12/15/2017   Mobitz type 1 second degree atrioventricular block 10/14/2018   Obstructive sleep apnea  02/28/2016   Obstructive sleep apnea on CPAP 02/28/2016   Formatting of this note might be different from the original. 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  Per 8/5//2020 [facility] Nephrology VN     Type 2 diabetes mellitus, with long-term current use of insulin  (HCC) 02/28/2016   Formatting of this note might be different from the original. Per 8/5//2020 [facility] Nephrology VN   Ulcer of foot, right, limited to breakdown of skin (HCC) 03/29/2018   Ulcer of right foot, with fat layer exposed (HCC) 08/22/2019   Vitamin D  deficiency 12/15/2017   Wellness examination 07/08/2018    Past Surgical History:  Procedure Laterality Date   CARDIOVERSION N/A 06/21/2020   Procedure: CARDIOVERSION;  Surgeon: Francyne Headland, MD;  Location: [facility] ENDOSCOPY;  Service: Cardiovascular;  Laterality: N/A;   LUMBAR LAMINECTOMY/DECOMPRESSION MICRODISCECTOMY Bilateral 09/22/2012   Procedure: LUMBAR LAMINECTOMY/DECOMPRESSION MICRODISCECTOMY 1 LEVEL;  Surgeon: Reyes JONETTA Budge, MD;  Location: [facility] NEURO ORS;  Service: Neurosurgery;  Laterality: Bilateral;  Lumbar two-three laminectomy   PACEMAKER IMPLANT N/A 05/03/2019   Procedure: PACEMAKER IMPLANT;  Surgeon: Neal Soyla Lunger, MD;  Location: [facility] INVASIVE CV LAB;  Service:  Cardiovascular;  Laterality: N/A;    Current Medications: Current Meds  Medication Sig   amiodarone  (PACERONE ) 200 MG tablet TAKE ONE TABLET BY MOUTH EVERY DAY   ammonium lactate (LAC-HYDRIN) 12 % lotion Apply 1 application topically 2 (two) times daily. Applied to feet   apixaban  (ELIQUIS ) 5 MG TABS tablet Take 1 tablet (5 mg total) by mouth 2 (two) times daily.   Bempedoic Acid  (NEXLETOL ) 180 MG TABS Take 1 tablet (180 mg total) by mouth daily in the afternoon.   carvedilol  (COREG ) 12.5 MG tablet TAKE ONE TABLET BY MOUTH TWICE DAILY   Cholecalciferol  (VITAMIN D ) 2000 units tablet Take 2,000 Units by mouth daily with breakfast.    clobetasol cream (TEMOVATE) 0.05 % Apply 1 Application topically 2 (two) times daily as needed (rash).   epoetin alfa (EPOGEN) 20000 UNIT/ML injection Inject 20,000 Units into the skin every 14 (fourteen) days.   esomeprazole (NEXIUM) 40 MG capsule Take 40 mg by mouth daily before breakfast.    febuxostat (ULORIC)  40 MG tablet Take 1 tablet by mouth daily.   ferrous sulfate  325 (65 FE) MG tablet Take 325 mg by mouth daily with breakfast.   hydrALAZINE  (APRESOLINE ) 100 MG tablet Take 100 mg by mouth 3 (three) times daily.    insulin  glargine, 1 Unit Dial , (TOUJEO  SOLOSTAR) 300 UNIT/ML Solostar Pen Inject 80 Units into the skin daily.   JARDIANCE 25 MG TABS tablet Take 25 mg by mouth daily.   latanoprost  (XALATAN ) 0.005 % ophthalmic solution Place 1 drop into both eyes at bedtime.    mupirocin  ointment (BACTROBAN ) 2 % Apply 1 application topically 2 (two) times daily as needed for wound care.   omega-3 acid ethyl esters (LOVAZA ) 1 g capsule Take 2 g by mouth 2 (two) times daily.   torsemide  (DEMADEX ) 20 MG tablet Take 20 mg by mouth 2 (two) times daily.   TRULICITY  4.5 MG/0.5ML SOPN Inject 4.5 mg into the skin once a week.   vitamin B-12 (CYANOCOBALAMIN) 1000 MCG tablet Take 1,000 mcg by mouth daily.     Allergies:   Rosuvastatin, Statins, Amlodipine  besylate,  Atorvastatin, and Ezetimibe   Social History   Socioeconomic History   Marital status: Married    Spouse name: Not on file   Number of children: Not on file   Years of education: Not on file   Highest education level: Not on file  Occupational History   Not on file  Tobacco Use   Smoking status: Former    Current packs/day: 2.00    Average packs/day: 2.0 packs/day for 5.0 years (10.0 ttl pk-yrs)    Types: Cigarettes, Cigars    Passive exposure: Past   Smokeless tobacco: Never   Tobacco comments:    quit 10 years ago  Vaping Use   Vaping status: Never Used  Substance and Sexual Activity   Alcohol use: No   Drug use: No   Sexual activity: Not on file  Other Topics Concern   Not on file  Social History Narrative   Not on file   Social Drivers of Health   Financial Resource Strain: Not on file  Food Insecurity: Low Risk  (12/09/2023)   Received from [facility]   Hunger Vital Sign    Within the past 12 months, you worried that your food would run out before you got money to buy more: Never true    Within the past 12 months, the food you bought just didn't last and you didn't have money to get more. : Never true  Transportation Needs: No Transportation Needs (12/09/2023)   Received from [facility]    In the past 12 months, has lack of reliable transportation kept you from medical appointments, meetings, work or from getting things needed for daily living? : No  Physical Activity: Not on file  Stress: Not on file  Social Connections: Not on file     Family History: The patient's family history includes Cancer in his mother; Liver cancer in his father. ROS:   Please see the history of present illness.    All 14 point review of systems negative except as described per history of present illness.  EKGs/Labs/Other Studies Reviewed:    The following studies were reviewed today:   EKG:  EKG Interpretation Date/Time:  Wednesday February 23 2024 08:01:46  EDT Ventricular Rate:  60 PR Interval:  188 QRS Duration:  200 QT Interval:  546 QTC Calculation: 546 R Axis:   -63  Text Interpretation: AV dual-paced  rhythm Abnormal ECG When compared with ECG of 26-Aug-2023 08:14, Vent. rate has decreased BY   3 BPM Confirmed by Liborio Hai reddy [phone]) on 02/23/2024 8:18:24 AM    Recent Labs: No results found for requested labs within last 365 days.  Recent Lipid Panel No results found for: CHOL, TRIG, HDL, CHOLHDL, VLDL, LDLCALC, LDLDIRECT  Physical Exam:    VS:  BP (!) 98/50 (BP Location: Right Arm, Patient Position: Sitting)   Pulse 60   Ht 5' 10 (1.778 m)   Wt 216 lb 3.2 oz (98.1 kg)   SpO2 95%   BMI 31.02 kg/m     Wt Readings from Last 3 Encounters:  02/23/24 216 lb 3.2 oz (98.1 kg)  08/26/23 218 lb 9.6 oz (99.2 kg)  02/15/23 221 lb (100.2 kg)     GENERAL:  Well nourished, well developed in no acute distress NECK: No JVD; No carotid bruits CARDIAC: RRR, S1 and S2 present, no murmurs, no rubs, no gallops CHEST: Pacemaker left infra clavicle.  Clear to auscultation without rales, wheezing or rhonchi  Extremities: Trace bilateral pitting ankle edema. Pulses bilaterally symmetric with radial 2+ and dorsalis pedis 2+ NEUROLOGIC:  Alert and oriented x 3  Medication Adjustments/Labs and Tests Ordered: Current medicines are reviewed at length with the patient today.  Concerns regarding medicines are outlined above.  Orders Placed This Encounter  Procedures   EKG 12-Lead   No orders of the defined types were placed in this encounter.   Signed, Hai jess Liborio, MD, MPH, [facility]. 02/23/2024 8:45 AM    [facility]

## 2024-02-23 NOTE — Assessment & Plan Note (Signed)
 Well-controlled on current regimen with carvedilol , hydralazine  as reviewed above. Target below 130/80 mmHg. Advised him to continue to monitor blood pressures at home if persistently systolic blood pressure below 889, may explain his sense of tiredness. If persistently low blood pressures below 110 mmHg, will consider titrating down hydralazine  dose to 50 mg 3 times daily.

## 2024-02-23 NOTE — Assessment & Plan Note (Signed)
 S/p permanent pacemaker continues to follow-up with device clinic and last device check 02/07/2024 was normal.

## 2024-02-23 NOTE — Assessment & Plan Note (Signed)
 AV dual paced rhythm today. Device check normal pacemaker check 02/07/2024.  Continue with anticoagulation using Eliquis  5 mg twice daily.  On rhythm control with amiodarone  200 mg once daily, tolerating well, last thyroid  panel from 11/12/2023 reviewed.

## 2024-02-23 NOTE — Assessment & Plan Note (Addendum)
 Euvolemic, compensated. Trace bilateral ankle edema. NYHA class I, good functional status at home.  Continue torsemide  20 mg twice daily. Advised about salt restriction to less than 2 g/day. Advised to monitor weights at home Weight today here in the office 216 pounds.  Weight gain over 2 to 3 pounds in a day or 5 pounds in a week should prompt higher doses of torsemide  for couple days and advised him to reach out to our office in that setting.  Guideline directed medical therapy limited due to renal function. Continue with carvedilol  12.5 mg twice daily. Continue with hydralazine  100 mg 3 times daily. On Jardiance 25 mg once daily diabetic dose. Tolerating well. Continue the same.   Continues to follow-up with nephrology and recommended to keep close follow-ups as worsening renal function may prompt higher doses of torsemide  or discussion about renal replacement therapy.

## 2024-02-23 NOTE — Assessment & Plan Note (Signed)
 Well-controlled on lipid panel from 11/12/2023 with HDL 40, LDL 61, triglycerides 202 and total cholesterol 128. Continue bempedoic acid  180 mg daily. Also on Lovaza .

## 2024-03-04 ENCOUNTER — Ambulatory Visit: Payer: Self-pay | Admitting: Cardiology

## 2024-04-13 ENCOUNTER — Telehealth: Payer: Self-pay | Admitting: Cardiology

## 2024-04-13 NOTE — Patient Instructions (Signed)
 ASSESSMENT/PLAN   1. Chronic diastolic CHF (congestive heart failure)    (CMD) (Primary) 04/13/2024  OV:  This is a chronic illness with exacerbation, progression or side effect of treatment - routine education - suspect fluid retention, will see if visible fluid present, check heart failure blood test - need to check kidney and salt balance, liver and protein balance, look for worsening kidney anemia, and due to amiodarone  recheck the thyroid  today - increase torsemide  20 mg 3 times a day, keep legs elevated, update in 2 days - - Related orders:  - Comprehensive Metabolic Panel; Future - CBC with Differential; Future - TSH; Future - B-Type Natriuretic Peptide (BNP); Future - XR Chest 2 Views; Future

## 2024-04-13 NOTE — Telephone Encounter (Signed)
 Needs this today   today [id number], dyspnea

## 2024-04-13 NOTE — Telephone Encounter (Signed)
 Pt called wanting to make an appt with Dr. Monetta for today for CP.. Told him I would need to do a phone note. He asked if Dr Monetta was here today and when I told him no. He said he was going to call his PCP or go to ER. He did not want me to do a phone note to Nurse for CP

## 2024-04-13 NOTE — Telephone Encounter (Signed)
 Called the patient and he stated that he was not having chest pain, instead he was having shortness of breath with exertion, weakness in his legs and his right hand was numb. He stated that he had an appointment with with his PCP at 11:00 am today. I explained that he should keep the appointment with his PCP. Patient verbalized understanding and had no further questions at this time.

## 2024-04-13 NOTE — Progress Notes (Signed)
 Louis Neal 12-28-1950 [id number] [name], MD 04/13/2024    FAMILY MEDICINE OFFICE VISIT    ASSESSMENT/PLAN   1. Chronic diastolic CHF (congestive heart failure)    (CMD) (Primary) 04/13/2024  OV:  This is a chronic illness with exacerbation, progression or side effect of treatment - routine education - suspect fluid retention, will see if visible fluid present, check heart failure blood test - need to check kidney and salt balance, liver and protein balance, look for worsening kidney anemia, and due to amiodarone  recheck the thyroid  today - increase torsemide  20 mg 3 times a day, keep legs elevated, update in 2 days - - Related orders:  - Comprehensive Metabolic Panel; Future - CBC with Differential; Future - TSH; Future - B-Type Natriuretic Peptide (BNP); Future - XR Chest 2 Views; Future     Immunization History  Administered Date(s) Administered  . Covid-19 Vaccine Unspecified 05/25/2023  . Influenza, High-dose Seasonal, Quadrivalent, Preservative Free 05/12/2019, 04/24/2020, 05/07/2021, 05/08/2022  . Influenza, Unspecified 03/30/2017  . Influenza, high-dose, trivalent, PF 05/19/2017, 05/04/2018, 05/12/2019, 04/24/2020, 05/07/2021, 04/21/2023  . Moderna Covid-19, mRNA,LNP-S,PF 12+ Yrs 06/09/2022, 05/25/2023  . Moderna SARS-CoV-2 Primary Series 12+ yrs 09/28/2019, 10/27/2019, 06/05/2020  . Pfizer SARS-CoV-2 Bivalent 12+ yrs 05/07/2021  . Pneumococcal Conjugate 13-Valent 03/24/2017  . Pneumococcal Conjugate Vaccine 20-Valent (PREVNAR-20) 6 wks+ 04/21/2023  . Pneumococcal Polysaccharide Vaccine, 23 Valent (PNEUMOVAX-23) 2Y+ 09/23/2012  . RSV, PF (AREXVY) 60Y+ 09/22/2022  . TDAP VACCINE (BOOSTRIX,ADACEL) 7Y+ 08/04/2011, 09/02/2021  . Varicella Zoster [facility]) 18Y+ 03/26/2018, 07/30/2018  . Zoster, Live 12/26/2012     There are no preventive care reminders to display for this patient.   Relevant interim history - -    CC   Chief Complaint  Patient  presents with  . Shortness of Breath  . Edema    Current appointment note:  - Louis Neal, leg wealness, tingling in right hand  HPI   History was provided by: patient  Shortness of breath 04/13/2024  - over past month grad 10# wt gain with ankle edema and DOE - no CP, fast heart rate - reviewed and discussed last cards visit 02/28/2024, noted ankle edema and wt gain but no changes in meds, dx dHF - legs feel heavier and weaker - -  REVIEW OF SYSTEMS   GENERAL symptoms - negative: malaise, body aches, fever, chills, sweats, appetite loss, weight change - positive: fatigue - - CARDIOVASCULAR symptoms - negative: chest pain, cold feet, claudication, palpitations, fast heart beats, fainting - positive: fatigue, swelling, dyspnea  EXAMINATION   Vitals:   04/13/24 1113  BP: 120/70  Pulse: 70  SpO2: 97%  Weight: 101 kg (223 lb)   Body mass index is 32.93 kg/m.   GENERAL examination - healthy, no distress, awake, alert, appropriately interactive, and with age-appropriate orientation - - NECK examination - Supple. Trachea midline, no deviation. No palpable adenopathy. Thyroid  normal position and size without tenderness or nodularity. No hoarseness, stridor or LT tenderness. * exceptions: none - - RESPIRATORY examination - There is no respiratory distress. There is no cyanosis or clubbing. Chest is symmetric. Percussion does not reveal dullness or hypertympany. Auscultation reveals good exchange and equal breath sounds throughout with no rales, rhonchi, expiratory wheeze, or other adventitious sound. * exceptions: right basilar rhonchus - - HEART examination - There is no edema. The precordium is quiet. The rhythm is regular and the rate is between 60 and 100. There is no audible murmur, gallop, rub or other extraneous sound. S1 is  normal and S2 is physiologically split. S3 and S4 are not heard. * exceptions: none - - GASTROINTESTINAL/ABD examination - The patient is anicteric  and there is no pallor. The abdomen is soft, nondistended, nontender and without palpable mass or HSM. Bowel sounds are normal.  There is no umbilical hernia or inflammation. There is no cutanous vascular engorgement. * exceptions: none   ---------------------------------------------------------------------------------------------------------------------  MEDS/ALLERGIES   Allergies Atorvastatin, Ezetimibe, Rosuvastatin, and Amlodipine  besylate  Medications  Current Medications[1]  The following portions of the patient's history were reviewed and updated as appropriate: allergies, current mediations, past medial history, past surgical history, past family history, past social history, and problem list.   Health Maintenance  Topic Date Due  . Diabetes: Foot Exam  02/06/2023  . COVID-19 Vaccine (7 - 2024-25 season) 04/03/2024  . Influenza Vaccine (1) 03/03/2024  . Medicare Annual Wellness (AWV) Subsequent Visits  04/20/2024  . Diabetes:  Quantitative uACR for Kidney Evaluation  07/18/2024  . Diabetes:  eGFR for Kidney Evaluation  11/11/2024  . Comprehensive Annual Visit  11/11/2024  . Diabetes: Hemoglobin A1C  11/11/2024  . Diabetes: Retinopathy Screening Combo  01/21/2025  . Depression Screening  04/13/2025  . Colorectal Cancer Screening  10/02/2026  . DTaP/Tdap/Td Vaccines (3 - Td or Tdap) 09/03/2031  . Adult RSV (60+ Years or Pregnancy)  Completed  . Pneumococcal Vaccine for Ages 50+  Completed  . Abdominal Aortic Aneurysm (AAA) Screening  Completed  . ZOSTER VACCINE  Completed  . HIB Vaccines  Aged Out  . Hepatitis B Vaccines  Aged Out  . IPV Vaccines  Aged Out  . Hepatitis A Vaccines  Aged Out  . Meningococcal Conjugate (ACWY) Vaccine  Aged Out  . Rotavirus Vaccines  Aged Out  . HPV Vaccines  Aged Out  . Meningococcal B Vaccine  Aged Out  . Medicare Annual Wellness (AWV) Initial Visit  Discontinued  . Hepatitis C Screening  Discontinued  . Diabetes Screening  Discontinued     Patient Active Problem List   Diagnosis Date Noted  . Chronic diastolic CHF (congestive heart failure)    (CMD) 02/23/2024  . Tick bite of left hip 11/29/2023  . Aphthous ulcer 06/30/2023  . Type 2 diabetes mellitus with foot ulcer, with long-term current use of insulin     (CMD) 04/18/2023  . Personal history of diabetic foot ulcer 10/08/2022  . Dermatitis 08/24/2022  . Gout of foot 07/07/2022  . Other bursal cyst, left ankle and foot 06/18/2022  . Diabetic ulcer of right midfoot associated with type 2 diabetes mellitus, with fat layer exposed (HCC) 03/03/2022  . Non-pressure chronic ulcer of other part of right foot limited to breakdown of skin    (CMD) 04/08/2021  . Comprehensive diabetic foot examination, type 2 DM, encounter for (HCC) 02/27/2021  . Erectile dysfunction 02/04/2021  . Pacemaker 02/02/2021  . Acute idiopathic gout of right ankle 06/06/2020  . Gastric antral vascular ectasia 12/17/2019  . Onychomycosis 10/03/2019  . Anemia due to stage 3b chronic kidney disease (HCC) 05/11/2019  . Cardiorenal syndrome with renal failure 11/14/2018  . Mobitz type 1 second degree atrioventricular block 10/14/2018  . Screening for colon cancer 07/21/2018  . Wellness examination 07/08/2018  . Screening for prostate cancer 07/08/2018  . Benign hypertension with chronic kidney disease, stage III (HCC) (NEPH) 03/08/2018  . Hyperkalemia 03/08/2018  . Vitamin D  deficiency 12/15/2017  . Metabolic bone disease 12/15/2017  . Microalbuminuria 12/15/2017  . History of diabetic ulcer of foot 06/29/2017  .  SSS (sick sinus syndrome) (HCC) 04/02/2017  . Paroxysmal atrial fibrillation (HCC) (CARDS) 03/02/2017  . Olecranon bursitis 01/13/2017  . Diaphragmatic hernia 09/04/2016  . Stucco keratoses 06/23/2016  . Hyperlipidemia, mixed 02/28/2016  . Bilateral lower extremity edema 02/28/2016  . 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 Obstructive sleep apnea on CPAP 02/28/2016  . Glaucoma 02/28/2016  .  Renal artery stenosis (HCC) 02/28/2016  . Essential hypertension (NEPH) (CARDS) 01/14/2016  . High risk medication use 01/14/2016  . Gastroesophageal reflux disease without esophagitis 11/28/2015  . Type 2 diabetes mellitus with diabetic polyneuropathy, with long-term current use of insulin  (HCC) 10/17/2015  . Foot callus 10/17/2015  . Lumbosacral radiculopathy 07/22/2012  . Arthropathy of lumbar facet joint 07/22/2012  . Degeneration of intervertebral disc of lumbar region 07/22/2012    Medical History[2]  Surgical History[3]  Social History[4]  Family History[5]  Scribe: This document serves as a record of services personally performed by Dr Lamar LITTIE Ofilia Mickey MD.  It was created on their behalf by Joen Macario Reeve, CMA, a trained medical scribe, and Certified Medical Assistant (CMA). During the course of documenting the history, physical exam and medical decision making, I was functioning as a Stage manager. The creation of this record is the provider's dictation and/or activities during the visit.  Electronically signed by Joen Macario Reeve, CMA 04/13/2024 9:11 AM  Electronically signed by Joen Macario Reeve, CMA 04/13/2024 11:13 AM   Lamar LITTIE Ofilia, MD  Provider: I agree the documentation is accurate and complete.  Electronically signed by: Lamar LITTIE Ofilia, MD 04/13/2024 11:32 AM          [1]  Current Outpatient Medications:  .  Accu-Chek Aviva Plus test strp test strip, USE 1 STRIP 4 TIMES DAILY TO TEST BLOOD SUGAR, Disp: 400 strip, Rfl: 3 .  amiodarone  (PACERONE ) 200 mg tablet, Take 200 mg by mouth Once Daily., Disp: , Rfl:  .  ammonium lactate (LAC-HYDRIN) 12 % lotion, Apply topically daily., Disp: 225 g, Rfl: 1 .  apixaban  (Eliquis ) 5 mg tab, Take 5 mg by mouth 2 (two) times a day., Disp: , Rfl:  .  blood-glucose meter misc, 1 Units by miscellaneous route Once Daily., Disp: 1 each, Rfl: 1 .  carvediloL  (COREG ) 12.5 mg tablet, Take 1 tablet by mouth in the morning and 1  tablet in the evening., Disp: , Rfl:  .  cholecalciferol  (Vitamin D3) 2,000 unit tablet, Take 1 each (2,000 Units total) by mouth daily., Disp: 100 tablet, Rfl: 3 .  clobetasoL (TEMOVATE) 0.05 % cream, APPLY TWICE DAILY FOR 2 WEEKS TO THE AFFECTED AREA AS NEEDED FOR ITCH, Disp: , Rfl:  .  cyanocobalamin (VITAMIN B12) 1,000 mcg tablet, Take 1 tablet (1,000 mcg total) by mouth daily., Disp: 90 tablet, Rfl: 3 .  dulaglutide  (Trulicity ) 4.5 mg/0.5 mL subcutaneous pen injector, INJECT 1 DOSE ONCE A WEEK, Disp: 6 mL, Rfl: 3 .  empagliflozin (Jardiance) 25 mg tab, Take 1 tablet (25 mg total) by mouth daily with breakfast. diabetes, Disp: 90 tablet, Rfl: 3 .  esomeprazole (NexIUM) 40 mg DR capsule, Take 1 capsule (40 mg total) by mouth daily. reflux, Disp: 90 capsule, Rfl: 3 .  febuxostat (ULORIC) 40 mg tablet, TAKE ONE TABLET BY MOUTH EVERY DAY, Disp: 30 tablet, Rfl: 1 .  ferrous sulfate  (FeroSuL) 325 mg (65 mg iron ) tablet, TAKE ONE TABLET BY MOUTH EVERY DAY, Disp: 100 tablet, Rfl: 3 .  FreeStyle Libre 3 Plus Sensor, Inject 1 sensor to the skin every  15 days for continuous glucose monitoring., Disp: 2 each, Rfl: 11 .  hydrALAZINE  (APRESOLINE ) 100 mg tablet, Take 1 tablet (100 mg total) by mouth in the morning and 1 tablet (100 mg total) at noon and 1 tablet (100 mg total) in the evening., Disp: 270 tablet, Rfl: 3 .  latanoprost  (XALATAN ) 0.005 % ophthalmic solution, PLACE ONE drop IN affected EYE IN THE IN THE EVENING, Disp: , Rfl: 6 .  mometasone (ELOCON) 0.1 % cream, Apply  topically Once Daily., Disp: 15 g, Rfl: 0 .  mupirocin  (BACTROBAN ) 2 % ointment, Apply to wound on foot daily, Disp: 22 g, Rfl: 0 .  Nexletol  180 mg tab, , Disp: , Rfl:  .  omega-3 acid ethyl esters (LOVAZA ) 1 gram capsule, TAKE 2 CAPSULES BY MOUTH TWICE DAILY, Disp: 360 capsule, Rfl: 3 .  pen needle, diabetic (Unifine Pentips) 31 gauge x 5/16 ndle, USE AS DIRECTED 4 TIMES DAILY, Disp: 400 each, Rfl: 3 .  torsemide  (DEMADEX ) 20 mg  tablet, TAKE ONE TABLET BY MOUTH TWICE DAILY, Disp: 180 tablet, Rfl: 3 .  Toujeo  SoloStar U-300 Insulin  300 unit/mL (1.5 mL) pen pen, Inject 85 Units under the skin daily., Disp: , Rfl:  .  triamcinolone acetonide (KENALOG) 0.1 % cream, apply TWICE DAILY AS NEEDED to involved areas. this should be targeted mainly at itchy or inflammed areas. only use AS NEEDED. do not use reguarly or daily like a moisturizer. use for prescribed purpose and then discontinue when problem resolves, Disp: , Rfl:   Current Facility-Administered Medications:  .  epoetin alfa (EPOGEN) injection 20,000 Units, 20,000 Units, subcutaneous, Q14 Days, Adetoye Lufadeju, MD, 20,000 Units at 12/08/23 1541 [2] Past Medical History: Diagnosis Date  . Acute kidney injury 09/22/2017   2018: progressive drop in GFR 2019: GFR 27  . Anemia due to stage 3 chronic kidney disease (CMD) 11/05/2017  . CKD (chronic kidney disease)   . Diabetes mellitus    (CMD)   . Hypercholesterolemia   . Hypertension   . Type 2 diabetes mellitus with stage 3 chronic kidney disease, with long-term current use of insulin     (CMD) 02/28/2016  [3] Past Surgical History: Procedure Laterality Date  . CARDIAC PACEMAKER PLACEMENT N/A 2020   Procedure: CARDIAC PACEMAKER PLACEMENT  . CATARACT EXTRACTION W/  INTRAOCULAR LENS IMPLANT Bilateral 2024  . LUMBAR LAMINECTOMY N/A 2014   Procedure: LUMBAR LAMINECTOMY; L2/3  [4] Social History Tobacco Use  . Smoking status: Former    Current packs/day: 0.00    Average packs/day: 2.0 packs/day for 35.0 years (70.0 ttl pk-yrs)    Types: Cigarettes    Start date: 08/03/1966    Quit date: 08/03/2001    Years since quitting: 22.7  . Smokeless tobacco: Never  Vaping Use  . Vaping status: Never Used  Substance Use Topics  . Alcohol use: No  . Drug use: No  [5] Family History Problem Relation Name Age of Onset  . Lung cancer Mother    . Hypertension Mother    . Liver cancer Father    . Abnormal EKG Father    .  Cancer Brother         esoph  . Cancer Brother    . Diabetes Paternal Grandmother    . Colon cancer Neg Hx    . Prostate cancer Neg Hx    . Multiple endocrine neoplasia Neg Hx    . Thyroid  cancer Neg Hx    . Coronary artery disease Neg Hx    .  Stroke Neg Hx

## 2024-04-14 ENCOUNTER — Encounter (HOSPITAL_COMMUNITY): Payer: Self-pay | Admitting: Internal Medicine

## 2024-04-14 ENCOUNTER — Observation Stay (HOSPITAL_COMMUNITY)

## 2024-04-14 ENCOUNTER — Observation Stay (HOSPITAL_COMMUNITY)
Admission: EM | Admit: 2024-04-14 | Discharge: 2024-04-17 | Disposition: A | Discharge status: Home / Self Care | Source: Ambulatory Visit | Attending: Neurology | Admitting: Neurology

## 2024-04-14 ENCOUNTER — Other Ambulatory Visit: Payer: Self-pay

## 2024-04-14 DIAGNOSIS — K317 Polyp of stomach and duodenum: Secondary | ICD-10-CM | POA: Insufficient documentation

## 2024-04-14 DIAGNOSIS — K5641 Fecal impaction: Secondary | ICD-10-CM | POA: Diagnosis not present

## 2024-04-14 DIAGNOSIS — M109 Gout, unspecified: Secondary | ICD-10-CM | POA: Diagnosis not present

## 2024-04-14 DIAGNOSIS — E1142 Type 2 diabetes mellitus with diabetic polyneuropathy: Secondary | ICD-10-CM | POA: Insufficient documentation

## 2024-04-14 DIAGNOSIS — K2270 Barrett's esophagus without dysplasia: Secondary | ICD-10-CM | POA: Diagnosis not present

## 2024-04-14 DIAGNOSIS — D500 Iron deficiency anemia secondary to blood loss (chronic): Secondary | ICD-10-CM | POA: Insufficient documentation

## 2024-04-14 DIAGNOSIS — R799 Abnormal finding of blood chemistry, unspecified: Secondary | ICD-10-CM | POA: Diagnosis present

## 2024-04-14 DIAGNOSIS — I4819 Other persistent atrial fibrillation: Secondary | ICD-10-CM | POA: Insufficient documentation

## 2024-04-14 DIAGNOSIS — N179 Acute kidney failure, unspecified: Secondary | ICD-10-CM | POA: Insufficient documentation

## 2024-04-14 DIAGNOSIS — K31819 Angiodysplasia of stomach and duodenum without bleeding: Secondary | ICD-10-CM | POA: Diagnosis not present

## 2024-04-14 DIAGNOSIS — Z87891 Personal history of nicotine dependence: Secondary | ICD-10-CM | POA: Insufficient documentation

## 2024-04-14 DIAGNOSIS — D649 Anemia, unspecified: Principal | ICD-10-CM | POA: Diagnosis present

## 2024-04-14 DIAGNOSIS — K209 Esophagitis, unspecified without bleeding: Secondary | ICD-10-CM | POA: Insufficient documentation

## 2024-04-14 DIAGNOSIS — I5032 Chronic diastolic (congestive) heart failure: Secondary | ICD-10-CM | POA: Insufficient documentation

## 2024-04-14 DIAGNOSIS — I495 Sick sinus syndrome: Secondary | ICD-10-CM | POA: Insufficient documentation

## 2024-04-14 DIAGNOSIS — E871 Hypo-osmolality and hyponatremia: Secondary | ICD-10-CM | POA: Insufficient documentation

## 2024-04-14 DIAGNOSIS — K31A0 Gastric intestinal metaplasia, unspecified: Secondary | ICD-10-CM | POA: Insufficient documentation

## 2024-04-14 DIAGNOSIS — I130 Hypertensive heart and chronic kidney disease with heart failure and stage 1 through stage 4 chronic kidney disease, or unspecified chronic kidney disease: Secondary | ICD-10-CM | POA: Insufficient documentation

## 2024-04-14 DIAGNOSIS — N184 Chronic kidney disease, stage 4 (severe): Secondary | ICD-10-CM | POA: Insufficient documentation

## 2024-04-14 DIAGNOSIS — I480 Paroxysmal atrial fibrillation: Secondary | ICD-10-CM | POA: Insufficient documentation

## 2024-04-14 DIAGNOSIS — E1122 Type 2 diabetes mellitus with diabetic chronic kidney disease: Secondary | ICD-10-CM | POA: Insufficient documentation

## 2024-04-14 DIAGNOSIS — K635 Polyp of colon: Secondary | ICD-10-CM | POA: Diagnosis not present

## 2024-04-14 LAB — CBC WITH DIFFERENTIAL/PLATELET
Abs Immature Granulocytes: 0.04 K/uL (ref 0.00–0.07)
Basophils Absolute: 0.1 K/uL (ref 0.0–0.1)
Basophils Relative: 1 %
Eosinophils Absolute: 0.1 K/uL (ref 0.0–0.5)
Eosinophils Relative: 2 %
HCT: 22.4 % — ABNORMAL LOW (ref 39.0–52.0)
Hemoglobin: 5.9 g/dL — CL (ref 13.0–17.0)
Immature Granulocytes: 1 %
Lymphocytes Relative: 8 %
Lymphs Abs: 0.5 K/uL — ABNORMAL LOW (ref 0.7–4.0)
MCH: 23.3 pg — ABNORMAL LOW (ref 26.0–34.0)
MCHC: 26.3 g/dL — ABNORMAL LOW (ref 30.0–36.0)
MCV: 88.5 fL (ref 80.0–100.0)
Monocytes Absolute: 0.6 K/uL (ref 0.1–1.0)
Monocytes Relative: 10 %
Neutro Abs: 4.7 K/uL (ref 1.7–7.7)
Neutrophils Relative %: 78 %
Platelets: 333 K/uL (ref 150–400)
RBC: 2.53 MIL/uL — ABNORMAL LOW (ref 4.22–5.81)
RDW: 15.9 % — ABNORMAL HIGH (ref 11.5–15.5)
WBC: 5.9 K/uL (ref 4.0–10.5)
nRBC: 0.3 % — ABNORMAL HIGH (ref 0.0–0.2)

## 2024-04-14 LAB — I-STAT CHEM 8, ED
BUN: 46 mg/dL — ABNORMAL HIGH (ref 8–23)
Calcium, Ion: 1.01 mmol/L — ABNORMAL LOW (ref 1.15–1.40)
Chloride: 96 mmol/L — ABNORMAL LOW (ref 98–111)
Creatinine, Ser: 3.2 mg/dL — ABNORMAL HIGH (ref 0.61–1.24)
Glucose, Bld: 163 mg/dL — ABNORMAL HIGH (ref 70–99)
HCT: 22 % — ABNORMAL LOW (ref 39.0–52.0)
Hemoglobin: 7.5 g/dL — ABNORMAL LOW (ref 13.0–17.0)
Potassium: 4.1 mmol/L (ref 3.5–5.1)
Sodium: 133 mmol/L — ABNORMAL LOW (ref 135–145)
TCO2: 25 mmol/L (ref 22–32)

## 2024-04-14 LAB — CBG MONITORING, ED
Glucose-Capillary: 99 mg/dL (ref 70–99)
Glucose-Capillary: 79 mg/dL (ref 70–99)

## 2024-04-14 LAB — GLUCOSE, CAPILLARY
Glucose-Capillary: 89 mg/dL (ref 70–99)
Glucose-Capillary: 161 mg/dL — ABNORMAL HIGH (ref 70–99)

## 2024-04-14 LAB — BASIC METABOLIC PANEL WITH GFR
Anion gap: 15 (ref 5–15)
BUN: 43 mg/dL — ABNORMAL HIGH (ref 8–23)
CO2: 23 mmol/L (ref 22–32)
Calcium: 8.4 mg/dL — ABNORMAL LOW (ref 8.9–10.3)
Chloride: 97 mmol/L — ABNORMAL LOW (ref 98–111)
Creatinine, Ser: 2.86 mg/dL — ABNORMAL HIGH (ref 0.61–1.24)
GFR, Estimated: 22 mL/min — ABNORMAL LOW (ref >60)
Glucose, Bld: 162 mg/dL — ABNORMAL HIGH (ref 70–99)
Potassium: 4.1 mmol/L (ref 3.5–5.1)
Sodium: 135 mmol/L (ref 135–145)

## 2024-04-14 LAB — HEMOGLOBIN AND HEMATOCRIT, BLOOD
HCT: 26.1 % — ABNORMAL LOW (ref 39.0–52.0)
Hemoglobin: 7.5 g/dL — ABNORMAL LOW (ref 13.0–17.0)

## 2024-04-14 LAB — PREPARE RBC (CROSSMATCH)

## 2024-04-14 LAB — ABO/RH: ABO/RH(D): O POS

## 2024-04-14 MED ORDER — INSULIN ASPART 100 UNIT/ML IJ SOLN
0.0000 [IU] | Freq: Three times a day (TID) | INTRAMUSCULAR | Status: DC
  Administered 2024-04-16: 2 [IU] via SUBCUTANEOUS
  Administered 2024-04-17: 5 [IU] via SUBCUTANEOUS

## 2024-04-14 MED ORDER — AMIODARONE HCL 200 MG PO TABS
200.0000 mg | ORAL_TABLET | Freq: Every day | ORAL | Status: DC
  Administered 2024-04-15 – 2024-04-17 (×3): 200 mg via ORAL
  Filled 2024-04-14 (×3): qty 1

## 2024-04-14 MED ORDER — SODIUM CHLORIDE 0.9% IV SOLUTION: Freq: Once | INTRAVENOUS | Status: AC

## 2024-04-14 MED ORDER — PANTOPRAZOLE SODIUM 40 MG PO TBEC
40.0000 mg | DELAYED_RELEASE_TABLET | Freq: Every day | ORAL | Status: DC
  Administered 2024-04-14 – 2024-04-15 (×2): 40 mg via ORAL
  Filled 2024-04-14 (×2): qty 1

## 2024-04-14 MED ORDER — INSULIN ASPART 100 UNIT/ML IJ SOLN: 0.0000 [IU] | Freq: Every day | INTRAMUSCULAR | Status: DC

## 2024-04-14 MED ORDER — CARVEDILOL 12.5 MG PO TABS
12.5000 mg | ORAL_TABLET | Freq: Two times a day (BID) | ORAL | Status: DC
  Administered 2024-04-14 – 2024-04-17 (×6): 12.5 mg via ORAL
  Filled 2024-04-14 (×6): qty 1

## 2024-04-14 MED ORDER — INSULIN GLARGINE 100 UNIT/ML ~~LOC~~ SOLN
80.0000 [IU] | Freq: Every day | SUBCUTANEOUS | Status: DC
Start: 2024-04-15 — End: 2024-04-17
  Administered 2024-04-15 – 2024-04-16 (×2): 80 [IU] via SUBCUTANEOUS
  Filled 2024-04-14 (×3): qty 0.8

## 2024-04-14 MED ORDER — HYDRALAZINE HCL 50 MG PO TABS
100.0000 mg | ORAL_TABLET | Freq: Three times a day (TID) | ORAL | Status: DC
Start: 2024-04-14 — End: 2024-04-17
  Administered 2024-04-14 – 2024-04-17 (×9): 100 mg via ORAL
  Filled 2024-04-14 (×9): qty 2

## 2024-04-14 MED ORDER — FUROSEMIDE 10 MG/ML IJ SOLN
40.0000 mg | Freq: Once | INTRAMUSCULAR | Status: AC
  Administered 2024-04-14: 40 mg via INTRAVENOUS
  Filled 2024-04-14: qty 4

## 2024-04-14 MED ORDER — APIXABAN 5 MG PO TABS
5.0000 mg | ORAL_TABLET | Freq: Two times a day (BID) | ORAL | Status: DC
Start: 2024-04-14 — End: 2024-04-15
  Administered 2024-04-14 – 2024-04-15 (×2): 5 mg via ORAL
  Filled 2024-04-14 (×2): qty 1

## 2024-04-14 NOTE — Progress Notes (Signed)
 New Admission Note:   Arrival Method: stretcher Mental Orientation: aa+ox4 Telemetry: n/a Assessment: Completed Skin: c/d/i IV: blood infusing Pain: denies Tubes: n/a Safety Measures: Safety Fall Prevention Plan has been given, discussed and signed Admission: Completed [facility] Orientation: Patient has been orientated to the room, unit and staff.  Family:present  Orders have been reviewed and implemented. Will continue to monitor the patient. Call light has been placed within reach and bed alarm has been activated.   Doyal Sias, RN

## 2024-04-14 NOTE — Plan of Care (Signed)

## 2024-04-14 NOTE — ED Triage Notes (Signed)
 Yesterday hgb 5.3

## 2024-04-14 NOTE — Hospital Course (Addendum)
#  Acute on Chronic Anemia #Iron  Deficiency Anemia #GAVE Patient presented to the emergency department with concerns of fatigue and found to have hemoglobin of 5.9.  He did not have any bleeding symptoms. 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  During this hospitalization, no evidence of melena.  No bright red blood per rectum.  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  He was treated with endoscopic ligation.  GI started Carafate  and increased PPI to twice daily.  Patient also has iron  deficiency, in which we gave IV iron .  Hemoglobin at discharge was 8.5.  He should follow-up with his outpatient gastroenterologist.   #Chronic Diastolic Heart Failure During initial presentation, patient was hypervolemic.  Patient was given Lasix  in the emergency department. Recent BNP of 352.  Last Echo on 02/18/2023 showing EF of 60-65% with mild LVH.  Normal diastolic function.  Normal RV systolic function with mildly elevated pulmonary artery pressure.  Mild LA dilation.  Patient responded well to Lasix .  Continued home torsemide  during hospitalization after dose of Lasix .    #AKI #CKD4 Patient came in with creatinine of 2.86.  Baseline appears to be 2.0-2.2.  During hospitalization patient responded well to diuretic therapy and hemoglobin came back to baseline.  AKI multifactorial likely in the setting of prerenal and hypervolemia.  Creatinine was 2.47 on day of discharge.   #PAF  #Sick Sinus Syndrome s/p Pacemaker No acute events during hospitalization.  Resumed home Eliquis  and amiodarone  during hospitalization.  Eliquis  was held during endoscopy procedure.  GI recommended holding for 2 days which we conveyed to the patient.  This is the first bleeding instance that we are aware of for this patient.  If he continues to have recurrent bleeds while on Eliquis , consider cardiology referral for potential Watchman device.   #T2DM with  peripheral neuropathy No acute concerns during hospitalization.  Patient continued his Lantus  80 units daily.  For outpatient provider, do recommend switching to 40 units twice daily for better absorption of Lantus .  Patient blood sugars measured well during the hospitalization.  Resumed home Lantus , Jardiance, and Trulicity  at discharge.     #Barrett Esophagus No acute concerns during hospitalization.  Patient continued pantoprazole  40 mg daily during hospitalization.  This has since been increased to 40 mg twice daily for a month.   #Hypertension Blood pressure measured well during hospitalization.  Patient continued hydralazine  100 mg 3 times daily, Coreg  12.5 mg twice daily during hospitalization.   #Gout No acute concerns during hospitalization.  Held home febuxostat during hospitalization in the setting of AKI.  Resume at discharge.

## 2024-04-14 NOTE — ED Notes (Signed)
 2 units received from blood bank in cooler. 6 hours to infuse from this time.

## 2024-04-14 NOTE — ED Triage Notes (Signed)
 Pt here form home with c/o abnormal labs , hgb of 5.3 on lab work from yesterday , hx of anemia

## 2024-04-14 NOTE — H&P (Cosign Needed Addendum)
 Date: 04/14/2024               Patient Name:  Louis Neal MRN: [id number]  DOB: 1951-06-30 Age / Sex: 73 y.o., male   PCP: [name], MD         Medical Service: Internal Medicine Teaching Service         Attending Physician: Dr. Dayton Eastern      First Contact: Letha Cheadle, MD    Second Contact: Dr. Roetta Chars, MD          Pager Information: First Contact Pager: [phone]   Second Contact Pager: [phone]   SUBJECTIVE   Chief Complaint: Abnormal labs  History of Present Illness  Louis Neal is a 73 y.o. male with PMHx of Chronic Diastolic HF, PAF, Sick Sinus syndrome s/p pacemaker, T2DM, CKD4, Barrett's Esophagus, Anemia, HTN, Gout who presents for evaluation after Hgb was 5.3 yesterday at his PCP's office.  Patient states that he has been short of breath for the past several weeks as well as fatigue.  He went to his PCPs office yesterday and laboratory testing showed hemoglobin of 5.3.  He was told to come to the emergency department for blood transfusions.  He has not noticed any recent bleeding.  He says that he has dark stools occasionally, but notes that he uses iron  pills.  Last bowel removal was yesterday and normal.  Denies hematemesis or hematuria.   Because he has Barrett's esophagus he receives endoscopic monitoring every 3 years.  There is no reports of abnormalities related to bleeding during the last 1.  Last colonoscopy was about 5 years ago and the patient was told that he likely would not need another one. Although he has shortness of breath, he does not have any cough but does note some phlegm.  Associated symptoms include leg swelling which was present at his prior cardiologist visit about a month ago.  He recently increased torsemide  from 20 mg twice daily to 20 mg 3 times daily.  Denies CP, lightheadedness, dizziness, nausea, vomiting, and diarrhea.  ED Course:  Labs significant for hemoglobin of 5.9, creatinine of 2.86 Imaging: None Received 2 units  pRBCs Consults: None  Meds  Amiodarone  200mg  took this morning Eliquis  5mg  BID  Coreg  12.5mg  BID Trulicity  4.5mg  yesterday  Jardiance 25mg  daily  Nexium 30 Febuxostat  Hydralazine  100mg  TID Latanaprost .005% solution  Torsemide  20mg  TID  Toujeo  80U daily AM    Allergies  Allergies as of 04/14/2024 - Review Complete 04/14/2024  Allergen Reaction Noted   Amlodipine  besylate Swelling and Other (See Comments) 04/14/2019   Ezetimibe Other (See Comments) 01/20/2016   Statins Other (See Comments) 09/27/2023     Past Medical History Anemia of chronic kidney disease Chronic diastolic heart failure Paroxysmal A-fib Sinus sick syndrome s/p pacemaker placement Hypertension Gout Type 2 diabetes Diabetic neuropathy CKD stage IV  Past Surgical History Past Surgical History:  Procedure Laterality Date   CARDIOVERSION N/A 06/21/2020   Procedure: CARDIOVERSION;  Surgeon: Francyne Headland, MD;  Location: [facility];  Service: Cardiovascular;  Laterality: N/A;   LUMBAR LAMINECTOMY/DECOMPRESSION MICRODISCECTOMY Bilateral 09/22/2012   Procedure: LUMBAR LAMINECTOMY/DECOMPRESSION MICRODISCECTOMY 1 LEVEL;  Surgeon: Reyes JONETTA Budge, MD;  Location: [facility]URO ORS;  Service: Neurosurgery;  Laterality: Bilateral;  Lumbar two-three laminectomy   PACEMAKER IMPLANT N/A 05/03/2019   Procedure: PACEMAKER IMPLANT;  Surgeon: Inocencio Soyla Lunger, MD;  Location: [facility] INVASIVE CV LAB;  Service: Cardiovascular;  Laterality: N/A;   Social History  Living  Situation: Lives in [place], [place] with his wife 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   OBJECTIVE:   Physical Exam: Blood pressure (!) 162/48, pulse 62, temperature 97.9 F (36.6 C), resp. rate 18, height 5' 10 (1.778 m), weight 100.7 kg, SpO2 100%.  Constitutional: well-appearing, well-nourished, in no acute distress HENT: normocephalic atraumatic, mucous membranes moist Eyes: conjunctiva non-erythematous, PERRL, no scleral icterus Cardiovascular: regular rate and rhythm, no m/r/g, audible pacemaker click Pulmonary/Chest: normal work of breathing on room air, lungs clear to auscultation bilaterally Abdominal: soft, non-tender, non-distended, bowel sounds normal MSK: normal bulk and tone Neurological: alert & oriented x3, able to lift himself up using bed rails Skin: warm and dry Extremities: significant pitting edema to BLE from ankle to knees; peripheral pulses intact but cool to the touch; toenails grossly thickened, bandage noted to plantar aspect of right foot.  Psych: normal mood and affect, thought content normal  Labs: CBC    Component Value Date/Time   WBC 5.9 04/14/2024 1031   RBC 2.53 (L) 04/14/2024 1031   HGB 7.5 (L) 04/14/2024 1046   HGB 10.6 (L) 04/28/2019 0923   HCT 22.0 (L) 04/14/2024 1046   HCT 30.8 (L) 04/28/2019 0923   PLT 333 04/14/2024 1031   PLT 246 04/28/2019 0923   MCV 88.5 04/14/2024 1031   MCV 94 04/28/2019 0923   MCH 23.3 (L) 04/14/2024 1031   MCHC 26.3 (L) 04/14/2024 1031   RDW 15.9 (H) 04/14/2024 1031   RDW 13.4 04/28/2019 0923   LYMPHSABS 0.5 (L) 04/14/2024 1031   MONOABS 0.6 04/14/2024 1031   EOSABS 0.1 04/14/2024 1031   BASOSABS 0.1 04/14/2024 1031     CMP     Component Value Date/Time   NA 133 (L) 04/14/2024 1046   NA 137 04/30/2022 0942   K 4.1 04/14/2024 1046   CL 96 (L) 04/14/2024 1046   CO2 23 04/14/2024 1031   GLUCOSE 163 (H) 04/14/2024 1046   BUN 46 (H) 04/14/2024 1046   BUN 36 (H) 04/30/2022 0942   CREATININE 3.20 (H)  04/14/2024 1046   CALCIUM 8.4 (L) 04/14/2024 1031   PROT 6.7 04/30/2022 0942   ALBUMIN 4.5 04/30/2022 0942   AST 17 04/30/2022 0942   ALT 15 04/30/2022 0942   ALKPHOS 74 04/30/2022 0942   BILITOT 0.2 04/30/2022 0942   GFRNONAA 22 (L) 04/14/2024 1031   GFRAA 34 (L) 04/28/2019 0916    Imaging: DG CHEST PORT 1 VIEW Result Date: 04/14/2024 CLINICAL DATA:  Shortness of breath EXAM: PORTABLE CHEST 1 VIEW COMPARISON:  May 04, 2019 FINDINGS: Unchanged left chest wall pacemaker. No focal consolidations. No pleural effusions. No pneumothorax. Unchanged cardiomediastinal silhouette.  No acute osseous findings. IMPRESSION: No active disease. Electronically Signed   By: Michaeline Blanch M.D.   On: 04/14/2024 16:04    EKG: personally reviewed my interpretation is with paced rhythm LAD which is consistent with prior EKGs.  ASSESSMENT & PLAN:   Assessment & Plan by Problem: Principal Problem:   Acute on chronic  anemia   Louis Neal is a male living with a history of PMHx of Chronic Diastolic HF, PAF, Sick Sinus syndrome s/p pacemaker, T2DM, CKD4, Barrett's Esophagus, Anemia, HTN, and Gout who presented with fatigue + SOB and was admitted for acute on chronic anemia on hospital day 0.  #Acute on Chronic Anemia Patient presenting with 2 weeks of fatigue and shortness of breath and recent hemoglobin of 5.3 yesterday at PCPs office.  Has history of Barrett's esophagus with prior endoscopies without evidence of bleeding.  Last colonoscopy 5-7 years ago also without evidence of bleeds.  No bleeding symptoms or signs.  Hemoglobin in the ED is 5.9.  Last anemia workup on 11/2023 showed ferritin of 10. No clear cause of current drop in hemoglobin.  Has never received a blood transfusion.  Chronic anemia is likely multifactorial in the setting of anemia of CKD given 3-year history as well as iron  deficiency anemia.  Will trend hemoglobin after 2 units and continue home ferrous sulfate . Will consider iron   supplementation. - s/p 2u pRBCs, f/u post-transfusion H&H - daily CBCs - iron  panel, vit B12, folate pending  #Chronic Diastolic Heart Failure Appears hypervolemic on exam with recent BNP of 352.  Last Echo on 02/18/2023 showing EF of 60-65% with mild LVH.  Normal diastolic function.  Normal RV systolic function with mildly elevated pulmonary artery pressure.  Mild LA dilation.  Significant pitting edema noted to bilateral lower extremities. CXR negative for acute cardiopulmonary disease.  Patient's SOB may be due to heart failure. Will consider restarting daily loop diuretic tomorrow. - s/p IV Lasix  40 mg - Hold home torsemide  today  #AKI #CKD4 Creatinine on admission of 2.86, 3.20 on Istat with prior baseline of 2-2.2.  Labs from PCP immediately prior to admission with Cr of 2.54. Likely in the setting of hypervolemia due to heart failure exacerbation given exam findings and elevated BNP. - trend BMPs  #PAF  #Sick Sinus Syndrome s/p Pacemaker Patient reports shortness of breath this is likely related to anemia.  No chest pain or palpitations reported.  TSH yesterday was normal. EKG showing paced rhythm with LAD. Last cardiology visit with Dr. Liborio on 02/23/2024 with normal device check on 02/07/2024.  - Continue home Eliquis  5 mg twice daily - Continue home amiodarone  200 mg daily  #T2DM with peripheral neuropathy Reports decrease sensation below his ankles bilaterally for the past year.  No acute changes.  Last hemoglobin A1c was 6.7 on 11/12/2023.  Will continue on home insulin  regimen and sliding scale.  Can follow-up outpatient for further needs. - Lantus  80 units daily - SSI with meals and at bedtime  #Barrett Esophagus No acute concerns at this moment.  Patient denies any reflux symptoms.  Last endoscopy was reportedly within normal limits.  He can follow-up in the outpatient setting. He takes esomeprazole which we will switch to pantoprazole  while he is in the hospital. - Start  PO pantropazole 40mg  daily  #Hypertension Blood pressure up to 160s/60s upon admission.  Will continue home medications. - Continue home hydralazine  100 mg 3 times daily - Continue home Coreg  12.5 mg twice daily  #Gout No acute concerns. Last uric acid level in April 2025 was wnl. - hold home febuxostat in the setting of AKI  Best practice: Diet: Carb-Modified VTE: DOAC IVF: None,None Code: Full  Disposition planning: Prior to Admission Living Arrangement: Home Anticipated Discharge Location: Home  Dispo: Admit patient to Observation with expected length of stay less than 2 midnights.  Signed: Limuel Nieblas, MD [facility]  PGY-1 04/14/2024, 4:52 PM  Please contact IM Residency On-Call Pager at: [phone] or [phone].

## 2024-04-14 NOTE — ED Notes (Signed)
 Blood bank notified of need for blood.   All appropriate samples sent as per request.

## 2024-04-14 NOTE — ED Provider Notes (Signed)
 [place] EMERGENCY DEPARTMENT AT [facility] Provider Note   CSN: [id number] Arrival date & time: 04/14/24  1010     Patient presents with: Abnormal Lab, Shortness of Breath, Weakness, and Fatigue   Louis Neal is a 73 y.o. male.   This is a very pleasant 73 year old male who is here today for low hemoglobin.  Patient went to his PCP yesterday because he had been feeling fatigued and short of breath.  He has a history of anemia, believed to be related to his chronic kidney disease.  Patient states that he has had endoscopy, colonoscopies done has never been given a clear cause for his anemia.  He denies any dark stool or blood in stools.  Patient says that he takes iron  pills, so his stools have always been dark, has not noticed any changes.  Denies any abdominal pain.  He is on Eliquis  for atrial fibrillation.   Abnormal Lab Shortness of Breath Weakness Associated symptoms: shortness of breath        Prior to Admission medications   Medication Sig Start Date End Date Taking? Authorizing Provider  amiodarone  (PACERONE ) 200 MG tablet TAKE ONE TABLET BY MOUTH EVERY DAY 01/26/24   Camnitz, Soyla Lunger, MD  ammonium lactate (LAC-HYDRIN) 12 % lotion Apply 1 application topically 2 (two) times daily. Applied to feet 07/20/17   [provider]  apixaban  (ELIQUIS ) 5 MG TABS tablet Take 1 tablet (5 mg total) by mouth 2 (two) times daily. 11/12/23   Monetta Redell PARAS, MD  Bempedoic Acid  (NEXLETOL ) 180 MG TABS Take 1 tablet (180 mg total) by mouth daily in the afternoon. 09/27/23   Monetta Redell PARAS, MD  carvedilol  (COREG ) 12.5 MG tablet TAKE ONE TABLET BY MOUTH TWICE DAILY 12/28/23   Monetta Redell PARAS, MD  Cholecalciferol  (VITAMIN D ) 2000 units tablet Take 2,000 Units by mouth daily with breakfast.  11/19/17   [provider]  clobetasol cream (TEMOVATE) 0.05 % Apply 1 Application topically 2 (two) times daily as needed (rash).    [provider]  epoetin alfa  (EPOGEN) 20000 UNIT/ML injection Inject 20,000 Units into the skin every 14 (fourteen) days. 08/06/22   [provider]  esomeprazole (NEXIUM) 40 MG capsule Take 40 mg by mouth daily before breakfast.  05/11/17   [provider]  febuxostat (ULORIC) 40 MG tablet Take 1 tablet by mouth daily. 09/06/22   [provider]  ferrous sulfate  325 (65 FE) MG tablet Take 325 mg by mouth daily with breakfast.    [provider]  hydrALAZINE  (APRESOLINE ) 100 MG tablet Take 100 mg by mouth 3 (three) times daily.  11/17/17   [provider]  insulin  glargine, 1 Unit Dial , (TOUJEO  SOLOSTAR) 300 UNIT/ML Solostar Pen Inject 80 Units into the skin daily. 08/05/20   [provider]  JARDIANCE 25 MG TABS tablet Take 25 mg by mouth daily.    [provider]  latanoprost  (XALATAN ) 0.005 % ophthalmic solution Place 1 drop into both eyes at bedtime.  12/18/15   [provider]  mupirocin  ointment (BACTROBAN ) 2 % Apply 1 application topically 2 (two) times daily as needed for wound care. 02/27/20   [provider]  omega-3 acid ethyl esters (LOVAZA ) 1 g capsule Take 2 g by mouth 2 (two) times daily. 06/17/20   [provider]  torsemide  (DEMADEX ) 20 MG tablet Take 20 mg by mouth 2 (two) times daily.    [provider]  TRULICITY  4.5 MG/0.5ML  SOPN Inject 4.5 mg into the skin once a week. 04/09/22   [provider]  vitamin B-12 (CYANOCOBALAMIN) 1000 MCG tablet Take 1,000 mcg by mouth daily. 04/22/20   [provider]    Allergies: Rosuvastatin, Statins, Amlodipine  besylate, Atorvastatin, and Ezetimibe    Review of Systems  Respiratory:  Positive for shortness of breath.   Neurological:  Positive for weakness.    Updated Vital Signs BP (!) 162/48   Pulse 62   Temp 97.9 F (36.6 C)   Resp 18   Ht 5' 10 (1.778 m)   Wt 100.7 kg   SpO2 100%   BMI 31.85 kg/m   Physical Exam Vitals and nursing note reviewed.   Constitutional:      Appearance: He is not toxic-appearing.  HENT:     Head: Normocephalic.  Pulmonary:     Effort: Pulmonary effort is normal.     Breath sounds: No decreased breath sounds.  Chest:     Chest wall: No mass.  Abdominal:     Palpations: Abdomen is soft.     Tenderness: There is no guarding.  Musculoskeletal:     Right lower leg: No edema.     Left lower leg: No edema.  Skin:    General: Skin is warm.  Neurological:     General: No focal deficit present.     Mental Status: He is alert.     (all labs ordered are listed, but only abnormal results are displayed) Labs Reviewed  CBC WITH DIFFERENTIAL/PLATELET - Abnormal; Notable for the following components:      Result Value   RBC 2.53 (*)    Hemoglobin 5.9 (*)    HCT 22.4 (*)    MCH 23.3 (*)    MCHC 26.3 (*)    RDW 15.9 (*)    nRBC 0.3 (*)    Lymphs Abs 0.5 (*)    All other components within normal limits  BASIC METABOLIC PANEL WITH GFR - Abnormal; Notable for the following components:   Chloride 97 (*)    Glucose, Bld 162 (*)    BUN 43 (*)    Creatinine, Ser 2.86 (*)    Calcium 8.4 (*)    GFR, Estimated 22 (*)    All other components within normal limits  I-STAT CHEM 8, ED - Abnormal; Notable for the following components:   Sodium 133 (*)    Chloride 96 (*)    BUN 46 (*)    Creatinine, Ser 3.20 (*)    Glucose, Bld 163 (*)    Calcium, Ion 1.01 (*)    Hemoglobin 7.5 (*)    HCT 22.0 (*)    All other components within normal limits  TYPE AND SCREEN  ABO/RH  PREPARE RBC (CROSSMATCH)    EKG: None  Radiology: No results found.   Procedures   Medications Ordered in the ED  0.9 %  sodium chloride  infusion (Manually program via Guardrails IV Fluids) (has no administration in time range)                                    Medical Decision Making 73 year old male here today for anemia.  Plan -patient without any GI symptoms, has had scopes which has not revealed a cause for his anemia.   He denies any change in his stooling.  Denies any blood in his stool.  He has a soft abdomen.  Looking  at the patient's blood work, his renal function has worsened over the last 1 year.  His creatinine has risen more significantly than his BUN.  Anemia may be related to his chronic kidney disease.  Will transfuse the patient here in the ED.  With his worsening renal function, anemia, anticoagulated status, do believe requires admission.  In the absence of brisk bleeding, do not believe patient requires any reversal of his anticoagulation.  I withheld digital rectal exam on this patient given that this appears to have been ongoing for a long time, no evidence of brisk bleeding, and DRE would not change emergent management in this particular patient.  Amount and/or Complexity of Data Reviewed Labs: ordered.  Risk Prescription drug management. Decision regarding hospitalization.        Final diagnoses:  Anemia, unspecified type    ED Discharge Orders     None          Mannie Pac T, DO 04/14/24 1340

## 2024-04-14 NOTE — Progress Notes (Signed)
 Admission assessment, education, and plan of care completed by Doyal PEAK (Charge). This RN did not assess comprehensively assess patient. Please refer to Doyal, RN assessment and notation.

## 2024-04-15 LAB — BPAM RBC
Blood Product Expiration Date: 202510112359
Blood Product Expiration Date: 202510112359
ISSUE DATE / TIME: 202509121512
ISSUE DATE / TIME: 202509121512
Unit Type and Rh: 5100
Unit Type and Rh: 5100

## 2024-04-15 LAB — GLUCOSE, CAPILLARY
Glucose-Capillary: 61 mg/dL — ABNORMAL LOW (ref 70–99)
Glucose-Capillary: 106 mg/dL — ABNORMAL HIGH (ref 70–99)
Glucose-Capillary: 187 mg/dL — ABNORMAL HIGH (ref 70–99)
Glucose-Capillary: 123 mg/dL — ABNORMAL HIGH (ref 70–99)
Glucose-Capillary: 146 mg/dL — ABNORMAL HIGH (ref 70–99)
Glucose-Capillary: 111 mg/dL — ABNORMAL HIGH (ref 70–99)

## 2024-04-15 LAB — TYPE AND SCREEN
ABO/RH(D): O POS
Antibody Screen: NEGATIVE
Unit division: 0
Unit division: 0

## 2024-04-15 LAB — BASIC METABOLIC PANEL WITH GFR
Anion gap: 10 (ref 5–15)
BUN: 41 mg/dL — ABNORMAL HIGH (ref 8–23)
CO2: 29 mmol/L (ref 22–32)
Calcium: 8.6 mg/dL — ABNORMAL LOW (ref 8.9–10.3)
Chloride: 99 mmol/L (ref 98–111)
Creatinine, Ser: 2.52 mg/dL — ABNORMAL HIGH (ref 0.61–1.24)
GFR, Estimated: 26 mL/min — ABNORMAL LOW (ref >60)
Glucose, Bld: 70 mg/dL (ref 70–99)
Potassium: 3.6 mmol/L (ref 3.5–5.1)
Sodium: 138 mmol/L (ref 135–145)

## 2024-04-15 LAB — CBC
HCT: 25.4 % — ABNORMAL LOW (ref 39.0–52.0)
Hemoglobin: 7.2 g/dL — ABNORMAL LOW (ref 13.0–17.0)
MCH: 24.6 pg — ABNORMAL LOW (ref 26.0–34.0)
MCHC: 28.3 g/dL — ABNORMAL LOW (ref 30.0–36.0)
MCV: 86.7 fL (ref 80.0–100.0)
Platelets: 308 K/uL (ref 150–400)
RBC: 2.93 MIL/uL — ABNORMAL LOW (ref 4.22–5.81)
RDW: 15.6 % — ABNORMAL HIGH (ref 11.5–15.5)
WBC: 5.5 K/uL (ref 4.0–10.5)
nRBC: 0.4 % — ABNORMAL HIGH (ref 0.0–0.2)

## 2024-04-15 LAB — HEMOGLOBIN AND HEMATOCRIT, BLOOD
HCT: 24.5 % — ABNORMAL LOW (ref 39.0–52.0)
Hemoglobin: 7 g/dL — ABNORMAL LOW (ref 13.0–17.0)

## 2024-04-15 LAB — IRON AND TIBC
Iron: 15 ug/dL — ABNORMAL LOW (ref 45–182)
Saturation Ratios: 3 % — ABNORMAL LOW (ref 17.9–39.5)
TIBC: 468 ug/dL — ABNORMAL HIGH (ref 250–450)
UIBC: 453 ug/dL

## 2024-04-15 LAB — FERRITIN: Ferritin: 11 ng/mL — ABNORMAL LOW (ref 24–336)

## 2024-04-15 LAB — FOLATE: Folate: 12.4 ng/mL (ref >5.9)

## 2024-04-15 LAB — VITAMIN B12: Vitamin B-12: 812 pg/mL (ref 180–914)

## 2024-04-15 MED ORDER — TORSEMIDE 20 MG PO TABS
20.0000 mg | ORAL_TABLET | Freq: Three times a day (TID) | ORAL | Status: DC
  Administered 2024-04-15 – 2024-04-17 (×6): 20 mg via ORAL
  Filled 2024-04-15 (×6): qty 1

## 2024-04-15 MED ORDER — MELATONIN 3 MG PO TABS
3.0000 mg | ORAL_TABLET | Freq: Every evening | ORAL | Status: DC | PRN
  Administered 2024-04-15: 3 mg via ORAL
  Filled 2024-04-15: qty 1

## 2024-04-15 MED ORDER — IRON SUCROSE 500 MG IVPB - SIMPLE MED
500.0000 mg | Freq: Once | INTRAVENOUS | Status: DC
  Filled 2024-04-15 (×2): qty 275

## 2024-04-15 MED ORDER — PANTOPRAZOLE SODIUM 40 MG IV SOLR: 40.0000 mg | INTRAVENOUS | Status: DC

## 2024-04-15 MED ORDER — PANTOPRAZOLE SODIUM 40 MG IV SOLR
40.0000 mg | Freq: Two times a day (BID) | INTRAVENOUS | Status: DC
  Administered 2024-04-15 – 2024-04-16 (×2): 40 mg via INTRAVENOUS
  Filled 2024-04-15 (×2): qty 10

## 2024-04-15 MED ORDER — SODIUM CHLORIDE 0.9 % IV SOLN
500.0000 mg | Freq: Once | INTRAVENOUS | Status: AC
  Administered 2024-04-15: 500 mg via INTRAVENOUS
  Filled 2024-04-15: qty 25

## 2024-04-15 NOTE — Discharge Instructions (Addendum)
 Mr. Louis Neal,  It was a pleasure taking care of you at [facility].  You were admitted for low hemoglobin, and we are discharging you home now that you are doing better. Please follow the following instructions.   1) We are unclear about if you were bleeding or not, but your hemoglobin did stabilize while you are here. 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 This was treated with endoscopic band ligation. You should start taking Carafate  for 2 weeks three times daily with meals. We have also switched your esomeprazole to pantoprazole  twice daily. You did require 2 units of blood during this hospitalization.  You did have low levels of iron , which we gave you some iron  through your IV.  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  2) Please follow-up with your primary care physician closely.  3) We are changing your esomeprazole (Nexium) to pantoprazole  (Protonix ) for 40 mg twice daily.  Please hold your Eliquis  for 2 days.  Resume taking Eliquis  on the morning of 04/19/2024.  Continue taking all of your other medications as prescribed.  5) If you notice any significant bleeding, please let your primary care physician know, as you are on a blood thinner.  Take care,   [facility]

## 2024-04-15 NOTE — Discharge Summary (Cosign Needed Addendum)
 Name: Louis Neal MRN: [id number] DOB: 31-Mar-1951 73 y.o. PCP: [name], MD  Date of Admission: 04/14/2024 10:17 AM Date of Discharge: 04/15/2024 Attending Physician: Dr. MICAEL Riis Winfrey  Discharge Diagnosis: Principal Problem:   Acute on chronic anemia Active Problems:   Stage 4 chronic kidney disease (HCC)   GAVE (gastric antral vascular ectasia)   Anemia   Gastric polyps AKI AGMA Hyponatremia  Discharge Medications: Allergies as of 04/17/2024       Reactions   Amlodipine  Besylate Swelling, Other (See Comments)   Ankle swelling   Ezetimibe Other (See Comments)   Muscle weakness   Statins Other (See Comments)   Muscle weakness myopathy '        Medication List     PAUSE taking these medications    apixaban  5 MG Tabs tablet Wait to take this until: April 19, 2024 Morning Commonly known as: Eliquis  Take 1 tablet (5 mg total) by mouth 2 (two) times daily.       STOP taking these medications    esomeprazole 40 MG capsule Commonly known as: NEXIUM       TAKE these medications    amiodarone  200 MG tablet Commonly known as: PACERONE  TAKE ONE TABLET BY MOUTH EVERY DAY   ammonium lactate 12 % lotion Commonly known as: LAC-HYDRIN Apply 1 application  topically 2 (two) times daily as needed for dry skin. Applied to feet   carvedilol  12.5 MG tablet Commonly known as: COREG  TAKE ONE TABLET BY MOUTH TWICE DAILY   clobetasol cream 0.05 % Commonly known as: TEMOVATE Apply 1 Application topically 2 (two) times daily as needed (rash).   cyanocobalamin 1000 MCG tablet Commonly known as: VITAMIN B12 Take 1,000 mcg by mouth daily.   epoetin alfa 20000 UNIT/ML injection Commonly known as: EPOGEN Inject 20,000 Units into the skin every 14 (fourteen) days.   febuxostat 40 MG tablet Commonly known as: ULORIC Take 1 tablet by mouth daily.   ferrous sulfate  325 (65 FE) MG tablet Take 325 mg by mouth daily with breakfast.   hydrALAZINE   100 MG tablet Commonly known as: APRESOLINE  Take 100 mg by mouth 3 (three) times daily.   ISOtretinoin 40 MG capsule Commonly known as: ACCUTANE Take 40 mg by mouth daily.   Jardiance 25 MG Tabs tablet Generic drug: empagliflozin Take 25 mg by mouth daily.   latanoprost  0.005 % ophthalmic solution Commonly known as: XALATAN  Place 1 drop into both eyes at bedtime.   Nexletol  180 MG Tabs Generic drug: Bempedoic Acid  Take 1 tablet (180 mg total) by mouth daily in the afternoon.   omega-3 acid ethyl esters 1 g capsule Commonly known as: LOVAZA  Take 2 g by mouth 2 (two) times daily.   pantoprazole  40 MG tablet Commonly known as: PROTONIX  Take 1 tablet (40 mg total) by mouth 2 (two) times daily.   sucralfate  1 g tablet Commonly known as: Carafate  Take 1 tablet (1 g total) by mouth 3 (three) times daily for 14 days.   torsemide  20 MG tablet Commonly known as: DEMADEX  Take 20 mg by mouth in the morning, at noon, and at bedtime.   Toujeo  SoloStar 300 UNIT/ML Solostar Pen Generic drug: insulin  glargine (1 Unit Dial ) Inject 80 Units into the skin daily.   Trulicity  4.5 MG/0.5ML Soaj Generic drug: Dulaglutide  Inject 4.5 mg into the skin once a week.   Vitamin D  50 MCG (2000 UT) tablet Take 2,000 Units by mouth daily with breakfast.  Disposition and follow-up:   Louis Neal was discharged from [facility] in Stable condition.  At the hospital follow up visit please address:  1.  Follow-up:  A: Acute on chronic anemia: Patient had hemoglobin of 5.9 requiring 2 units of blood during hospitalization.  Ferritin was low at 11.  He received IV iron  during this admission.  Follow-up CBC outpatient.  Likely in the setting of iron  deficiency, recurrent GAVE, anemia of chronic kidney disease.  Surgical pathology is pending for H. pylori.  B: AKI: During hospitalization patient had AKI, likely in setting of hypervolemia.  Diuresed and improved.  Could be  confounded with anemia as well.  Improved during hospitalization.  2.  Labs / imaging needed at time of follow-up: CBC  3.  Pending labs/ test needing follow-up: Surgical pathology from GI biopsy during endoscopy  4.  Medication Changes  Esomeprazole switched to pantoprazole  40 mg twice daily  Started Carafate  1 g 3 times daily for 2 weeks  Holding home Eliquis  for 2 days, then he can restart  Follow-up Appointments:  Follow-up Information     Dough, Lamar CROME, MD. Call in 2 day(s).   Specialty: Family Medicine Why: Please call your primary care physician to have hospital follow-up appointment Contact information: [address] [place] [address] [phone]                 Hospital Course by problem list:  #Acute on Chronic Anemia #Iron  Deficiency Anemia #GAVE Patient presented to the emergency department with concerns of fatigue and found to have hemoglobin of 5.9.  He did not have any bleeding symptoms. 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  During this hospitalization, no evidence of melena.  No bright red blood per rectum.  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  He was treated with endoscopic ligation.  GI started Carafate  and increased PPI to twice daily.  Patient also has iron  deficiency, in which we gave IV iron .  Hemoglobin at discharge was 8.5.  He should follow-up with his outpatient gastroenterologist.   #Chronic Diastolic Heart Failure During initial presentation, patient was hypervolemic.  Patient was given Lasix  in the emergency department. Recent BNP of 352.  Last Echo on 02/18/2023 showing EF of 60-65% with mild LVH.  Normal diastolic function.  Normal RV systolic function with mildly elevated pulmonary artery pressure.  Mild LA dilation.  Patient responded well to Lasix .  Continued home torsemide  during hospitalization after dose of Lasix .    #AKI #CKD4 Patient came in with  creatinine of 2.86.  Baseline appears to be 2.0-2.2.  During hospitalization patient responded well to diuretic therapy and hemoglobin came back to baseline.  AKI multifactorial likely in the setting of prerenal and hypervolemia.  Creatinine was 2.47 on day of discharge.   #PAF  #Sick Sinus Syndrome s/p Pacemaker No acute events during hospitalization.  Resumed home Eliquis  and amiodarone  during hospitalization.  Eliquis  was held during endoscopy procedure.  GI recommended holding for 2 days which we conveyed to the patient.  This is the first bleeding instance that we are aware of for this patient.  If he continues to have recurrent bleeds while on Eliquis , consider cardiology referral for potential Watchman device.   #T2DM with peripheral neuropathy No acute concerns during hospitalization.  Patient continued his Lantus  80 units daily.  For outpatient provider, do recommend switching to 40 units twice daily for better  absorption of Lantus .  Patient blood sugars measured well during the hospitalization.  Resumed home Lantus , Jardiance, and Trulicity  at discharge.     #Barrett Esophagus No acute concerns during hospitalization.  Patient continued pantoprazole  40 mg daily during hospitalization.  This has since been increased to 40 mg twice daily for a month.   #Hypertension Blood pressure measured well during hospitalization.  Patient continued hydralazine  100 mg 3 times daily, Coreg  12.5 mg twice daily during hospitalization.   #Gout No acute concerns during hospitalization.  Held home febuxostat during hospitalization in the setting of AKI.  Resume at discharge.9193 slippery I just did this sure.  Mild LA dilation.  Patient responded well to Lasix .  Continued home torsemide  during hospitalization after dose of Lasix .    #AKI #CKD4 Patient came in with creatinine of 2.86.  Baseline appears to be 2.0-2.2.  During hospitalization patient responded well to diuretic therapy and hemoglobin came back  to baseline.  AKI multifactorial likely in the setting of prerenal and hypervolemia.   #PAF  #Sick Sinus Syndrome s/p Pacemaker No acute events during hospitalization.  Resumed home Eliquis  and amiodarone  during hospitalization.  Given patient was not actively bleeding from our evaluation, we did not hold anticoagulation.     #T2DM with peripheral neuropathy No acute concerns during hospitalization.  Patient continued his Lantus  80 units daily.  For outpatient provider, do recommend switching to 40 units twice daily for better absorption of Lantus .  Patient blood sugars measured well during the hospitalization.  Resumed home Lantus , Jardiance, and Trulicity  at discharge.     #Barrett Esophagus No acute concerns during hospitalization.  Patient continue pantoprazole  40 mg daily during hospitalization.   #Hypertension Blood pressure measured well during hospitalization.  Patient continued hydralazine  100 mg 3 times daily, Coreg  12.5 mg twice daily during hospitalization.   #Gout No acute concerns during hospitalization.  Held home foot Oxistat during hospitalization in the setting of AKI.  Resume at discharge.  Discharge Subjective: Patient evaluated at bedside this morning.  He states he is feeling better.  He has no concerns this morning.  He requests to go home.  He denies any hematuria, melena, bright red blood per rectum, or any other bleeding episodes.  No bleeding episodes during this hospitalization.  Discharge Exam:   BP (!) 120/45 (BP Location: Left Arm)   Pulse 63   Temp 97.7 F (36.5 C) (Oral)   Resp 18   Ht 5' 10 (1.778 m)   Wt 100.7 kg   SpO2 99%   BMI 31.85 kg/m  Constitutional: well-appearing male in no acute distress HENT: normocephalic atraumatic, mucous membranes moist Eyes: conjunctiva non-erythematous, PERRL, no scleral icterus Neck: supple without lesions, thyroid  non-enlarged and non-tender Cardiovascular: regular rate and rhythm, no m/r/g Pulmonary/Chest:  normal work of breathing on room air, lungs clear to auscultation bilaterally Abdominal: soft, non-tender, non-distended, bowel sounds normal MSK: normal bulk and tone Neurological: alert & oriented x3 Skin: warm and dry Extremities: No significant pitting edema noted to bilateral lower extremities or cyanosis; peripheral pulses intact Psych: normal mood and affect, thought content normal  Pertinent Labs, Studies, and Procedures:     Latest Ref Rng & Units 04/17/2024    5:01 AM 04/16/2024    3:08 AM 04/15/2024    2:38 PM  CBC  WBC 4.0 - 10.5 K/uL 4.6  6.7    Hemoglobin 13.0 - 17.0 g/dL 8.5  7.6  7.0   Hematocrit 39.0 - 52.0 % 30.3  27.1  24.5  Platelets 150 - 400 K/uL 258  303         Latest Ref Rng & Units 04/16/2024    9:00 AM 04/15/2024    4:53 AM 04/14/2024   10:46 AM  CMP  Glucose 70 - 99 mg/dL 792  70  836   BUN 8 - 23 mg/dL 38  41  46   Creatinine 0.61 - 1.24 mg/dL 7.52  7.47  6.79   Sodium 135 - 145 mmol/L 133  138  133   Potassium 3.5 - 5.1 mmol/L 4.1  3.6  4.1   Chloride 98 - 111 mmol/L 94  99  96   CO2 22 - 32 mmol/L 20  29    Calcium 8.9 - 10.3 mg/dL 8.7  8.6      DG CHEST PORT 1 VIEW Result Date: 04/14/2024 CLINICAL DATA:  Shortness of breath EXAM: PORTABLE CHEST 1 VIEW COMPARISON:  May 04, 2019 FINDINGS: Unchanged left chest wall pacemaker. No focal consolidations. No pleural effusions. No pneumothorax. Unchanged cardiomediastinal silhouette.  No acute osseous findings. IMPRESSION: No active disease. Electronically Signed   By: Michaeline Blanch M.D.   On: 04/14/2024 16:04     Discharge Instructions: Discharge Instructions     Call MD for:  difficulty breathing, headache or visual disturbances   Complete by: As directed    Call MD for:  persistant dizziness or light-headedness   Complete by: As directed    Call MD for:  temperature >100.4   Complete by: As directed    Diet - low sodium heart healthy   Complete by: As directed    Increase activity slowly    Complete by: As directed        Signed: Waymond Cart, MD 04/17/2024, 3:44 PM   Internal Medicine Resident PGY-1

## 2024-04-15 NOTE — Progress Notes (Signed)
 HD#0 SUBJECTIVE:  Patient Summary: Louis Neal is a 73 y.o. with a pertinent PMH of Chronic Diastolic HF, PAF, Sick Sinus syndrome s/p pacemaker, T2DM, CKD4, Barrett's Esophagus, Anemia, HTN, and Gout who presented with fatigue + SOB and was admitted for acute on chronic anemia.  Overnight Events: NAEON.  Interim History: No signs of blood. He is feeling better. He confirms that he did not have any bleeding before presenting to the ED. He has had multiple scopes in the past. He does take an iron  supplement but he is not sure if he is having any true bleeding or not - sometimes his stools are darker. Can watch hemoglobin for one more day.   OBJECTIVE:  Vital Signs: Vitals:   04/14/24 1829 04/14/24 2028 04/15/24 0450 04/15/24 0814  BP: (!) 152/48 (!) 143/55 (!) 99/40 134/63  Pulse: (!) 59 62 62 65  Resp: 19 18 16 18   Temp: 97.7 F (36.5 C) 98.2 F (36.8 C) 98.5 F (36.9 C)   TempSrc: Oral Oral  Oral  SpO2: 100% 99% 99% 99%  Weight:      Height:        Filed Weights   04/14/24 1027  Weight: 100.7 kg     Intake/Output Summary (Last 24 hours) at 04/15/2024 1158 Last data filed at 04/15/2024 0900 Gross per 24 hour  Intake 770 ml  Output 0 ml  Net 770 ml   Net IO Since Admission: 770 mL [04/15/24 1158]  Physical Exam: Constitutional: well-appearing male in no acute distress HENT: normocephalic atraumatic, mucous membranes moist Eyes: conjunctiva non-erythematous, PERRL, no scleral icterus Neck: supple without lesions, thyroid  non-enlarged and non-tender Cardiovascular: regular rate and rhythm, no m/r/g Pulmonary/Chest: normal work of breathing on room air, lungs clear to auscultation bilaterally Abdominal: soft, non-tender, non-distended, bowel sounds normal MSK: normal bulk and tone Neurological: alert & oriented x3 Skin: warm and dry Extremities: significant pitting edema noted to bilateral lower extremities or cyanosis; peripheral pulses intact Psych: normal mood  and affect, thought content normal  Patient Lines/Drains/Airways Status     Active Line/Drains/Airways     Name Placement date Placement time Site Days   Peripheral IV 04/14/24 20 G Left Antecubital 04/14/24  1452  Antecubital  1   Incision 09/22/12 Back 09/22/12  1645  -- 4223            Pertinent labs and imaging:     Latest Ref Rng & Units 04/15/2024    4:53 AM 04/14/2024   11:18 PM 04/14/2024   10:46 AM  CBC  WBC 4.0 - 10.5 K/uL 5.5     Hemoglobin 13.0 - 17.0 g/dL 7.2  7.5  7.5   Hematocrit 39.0 - 52.0 % 25.4  26.1  22.0   Platelets 150 - 400 K/uL 308          Latest Ref Rng & Units 04/15/2024    4:53 AM 04/14/2024   10:46 AM 04/14/2024   10:31 AM  CMP  Glucose 70 - 99 mg/dL 70  836  837   BUN 8 - 23 mg/dL 41  46  43   Creatinine 0.61 - 1.24 mg/dL 7.47  6.79  7.13   Sodium 135 - 145 mmol/L 138  133  135   Potassium 3.5 - 5.1 mmol/L 3.6  4.1  4.1   Chloride 98 - 111 mmol/L 99  96  97   CO2 22 - 32 mmol/L 29   23   Calcium 8.9 - 10.3  mg/dL 8.6   8.4     DG CHEST PORT 1 VIEW Result Date: 04/14/2024 CLINICAL DATA:  Shortness of breath EXAM: PORTABLE CHEST 1 VIEW COMPARISON:  May 04, 2019 FINDINGS: Unchanged left chest wall pacemaker. No focal consolidations. No pleural effusions. No pneumothorax. Unchanged cardiomediastinal silhouette.  No acute osseous findings. IMPRESSION: No active disease. Electronically Signed   By: Michaeline Blanch M.D.   On: 04/14/2024 16:04    ASSESSMENT/PLAN:  Assessment: Principal Problem:   Acute on chronic anemia   Plan: #Acute on Chronic Anemia #Iron  Deficiency Anemia Initial hemoglobin in ED is 5.9 with improvement to 7.5 s/p 2u pRBCs now down to 7.2 this morning. Still no bleeding symptoms or signs. Acute on chronic anemia is likely multifactorial in the setting of anemia of CKD as well as iron  deficiency anemia.  Ferritin down to 11 today with normal folate and B12. Will recheck H&H later this afternoon and infuse IV Iron . - s/p 2u  pRBCs on 04/14/24 - daily CBCs - IV Iron  Sucrose 500 mg   #Chronic Diastolic Heart Failure Appears hypervolemic on exam with recent BNP of 352.  Last Echo on 02/18/2023 showing EF of 60-65% with mild LVH.  Normal diastolic function.  Normal RV systolic function with mildly elevated pulmonary artery pressure.  Mild LA dilation.  Pitting edema noted to bilateral lower extremities. CXR negative for acute cardiopulmonary disease.  Patient's SOB may be due to heart failure.  - start home PO torsemide  20mg  tid   #AKI #CKD4 Today creatinine is 2.52 improved from 2.86 on admission (baseline appears to be 2-2.2).  Labs from PCP immediately prior to admission with Cr of 2.54. Likely in the setting of hypervolemia due to heart failure given exam findings and elevated BNP of 352. Will continue to monitor as we ramp up his diuretics. - trend BMPs - hold home Jardiance, will resume on discharge   #PAF  #Sick Sinus Syndrome s/p Pacemaker Patient reports shortness of breath this is likely related to anemia.  No chest pain or palpitations reported.  TSH yesterday was normal. EKG showing paced rhythm with LAD. Last cardiology visit with Dr. Liborio on 02/23/2024 with normal device check on 02/07/2024.  - Continue home Eliquis  5 mg twice daily - Continue home amiodarone  200 mg daily   #T2DM with peripheral neuropathy Reports decrease sensation below his ankles bilaterally for the past year.  No acute changes.  Last hemoglobin A1c was 6.7 on 11/12/2023.  Will continue on home insulin  regimen and sliding scale. May consider switching dosing of long-acting insulin  to 40 units bid in the outpatient setting. - Lantus  80 units daily - SSI with meals and at bedtime - hold home Jardiance - hold home Trulicity    #Barrett Esophagus No acute concerns at this moment.  Patient denies any reflux symptoms.  Last endoscopy was reportedly within normal limits.  He can follow-up in the outpatient setting. He takes esomeprazole  which we will switch to pantoprazole  while he is in the hospital. - Continue PO pantropazole 40mg  daily   #Hypertension Blood pressure up to 160s/60s upon admission.  Will continue home medications. - Continue home hydralazine  100 mg 3 times daily - Continue home Coreg  12.5 mg twice daily   #Gout No acute concerns. Last uric acid level in April 2025 was wnl. - hold home febuxostat in the setting of AKI  Best Practice: Diet: Carb-Modified IVF: Fluids: none, Rate: None VTE: DOACs Code: Full  Disposition planning: Therapy Recs: None, DME: none Family  Contact: daughter, at bedside. DISPO: Anticipated discharge in 1-2 days to Home pending clinical improvement.  Signature:  Letha Cheadle, MD [facility]  PGY-1 04/15/2024, 11:58 AM  On Call pager [phone]

## 2024-04-15 NOTE — Care Management Obs Status (Signed)
 MEDICARE OBSERVATION STATUS NOTIFICATION   Patient Details  Name: BRAEDIN MILLHOUSE MRN: [id number] Date of Birth: November 29, 1950   Medicare Observation Status Notification Given:  Yes    Jon Cruel 04/15/2024, 10:49 AM

## 2024-04-16 DIAGNOSIS — E1122 Type 2 diabetes mellitus with diabetic chronic kidney disease: Secondary | ICD-10-CM

## 2024-04-16 DIAGNOSIS — K2270 Barrett's esophagus without dysplasia: Secondary | ICD-10-CM

## 2024-04-16 DIAGNOSIS — Z860100 Personal history of colon polyps, unspecified: Secondary | ICD-10-CM

## 2024-04-16 DIAGNOSIS — Z7901 Long term (current) use of anticoagulants: Secondary | ICD-10-CM

## 2024-04-16 DIAGNOSIS — N179 Acute kidney failure, unspecified: Secondary | ICD-10-CM

## 2024-04-16 DIAGNOSIS — D631 Anemia in chronic kidney disease: Secondary | ICD-10-CM

## 2024-04-16 DIAGNOSIS — N184 Chronic kidney disease, stage 4 (severe): Secondary | ICD-10-CM

## 2024-04-16 DIAGNOSIS — I130 Hypertensive heart and chronic kidney disease with heart failure and stage 1 through stage 4 chronic kidney disease, or unspecified chronic kidney disease: Secondary | ICD-10-CM

## 2024-04-16 DIAGNOSIS — I5030 Unspecified diastolic (congestive) heart failure: Secondary | ICD-10-CM

## 2024-04-16 DIAGNOSIS — D500 Iron deficiency anemia secondary to blood loss (chronic): Secondary | ICD-10-CM | POA: Diagnosis not present

## 2024-04-16 DIAGNOSIS — Z794 Long term (current) use of insulin: Secondary | ICD-10-CM

## 2024-04-16 DIAGNOSIS — Z79899 Other long term (current) drug therapy: Secondary | ICD-10-CM

## 2024-04-16 LAB — CBC
HCT: 27.1 % — ABNORMAL LOW (ref 39.0–52.0)
Hemoglobin: 7.6 g/dL — ABNORMAL LOW (ref 13.0–17.0)
MCH: 24.3 pg — ABNORMAL LOW (ref 26.0–34.0)
MCHC: 28 g/dL — ABNORMAL LOW (ref 30.0–36.0)
MCV: 86.6 fL (ref 80.0–100.0)
Platelets: 303 K/uL (ref 150–400)
RBC: 3.13 MIL/uL — ABNORMAL LOW (ref 4.22–5.81)
RDW: 15.7 % — ABNORMAL HIGH (ref 11.5–15.5)
WBC: 6.7 K/uL (ref 4.0–10.5)
nRBC: 0.3 % — ABNORMAL HIGH (ref 0.0–0.2)

## 2024-04-16 LAB — BASIC METABOLIC PANEL WITH GFR
Anion gap: 19 — ABNORMAL HIGH (ref 5–15)
BUN: 38 mg/dL — ABNORMAL HIGH (ref 8–23)
CO2: 20 mmol/L — ABNORMAL LOW (ref 22–32)
Calcium: 8.7 mg/dL — ABNORMAL LOW (ref 8.9–10.3)
Chloride: 94 mmol/L — ABNORMAL LOW (ref 98–111)
Creatinine, Ser: 2.47 mg/dL — ABNORMAL HIGH (ref 0.61–1.24)
GFR, Estimated: 27 mL/min — ABNORMAL LOW (ref >60)
Glucose, Bld: 207 mg/dL — ABNORMAL HIGH (ref 70–99)
Potassium: 4.1 mmol/L (ref 3.5–5.1)
Sodium: 133 mmol/L — ABNORMAL LOW (ref 135–145)

## 2024-04-16 LAB — GLUCOSE, CAPILLARY
Glucose-Capillary: 186 mg/dL — ABNORMAL HIGH (ref 70–99)
Glucose-Capillary: 152 mg/dL — ABNORMAL HIGH (ref 70–99)
Glucose-Capillary: 148 mg/dL — ABNORMAL HIGH (ref 70–99)
Glucose-Capillary: 99 mg/dL (ref 70–99)

## 2024-04-16 LAB — MAGNESIUM: Magnesium: 2.4 mg/dL (ref 1.7–2.4)

## 2024-04-16 MED ORDER — SIMETHICONE 80 MG PO CHEW
240.0000 mg | CHEWABLE_TABLET | Freq: Once | ORAL | Status: AC
Start: 2024-04-16 — End: 2024-04-16
  Administered 2024-04-16: 240 mg via ORAL
  Filled 2024-04-16: qty 3

## 2024-04-16 MED ORDER — PANTOPRAZOLE SODIUM 40 MG PO TBEC
40.0000 mg | DELAYED_RELEASE_TABLET | Freq: Every day | ORAL | Status: DC
  Administered 2024-04-17: 40 mg via ORAL
  Filled 2024-04-16: qty 1

## 2024-04-16 MED ORDER — SIMETHICONE 80 MG PO CHEW
240.0000 mg | CHEWABLE_TABLET | Freq: Once | ORAL | Status: AC
  Administered 2024-04-16: 240 mg via ORAL
  Filled 2024-04-16: qty 3

## 2024-04-16 MED ORDER — POLYETHYLENE GLYCOL 3350 17 GM/SCOOP PO POWD
119.0000 g | Freq: Once | ORAL | Status: AC
Start: 2024-04-16 — End: 2024-04-16
  Administered 2024-04-16: 119 g via ORAL
  Filled 2024-04-16: qty 119

## 2024-04-16 MED ORDER — BISACODYL 5 MG PO TBEC
10.0000 mg | DELAYED_RELEASE_TABLET | Freq: Once | ORAL | Status: AC
Start: 2024-04-16 — End: 2024-04-16
  Administered 2024-04-16: 10 mg via ORAL
  Filled 2024-04-16: qty 2

## 2024-04-16 MED ORDER — SODIUM CHLORIDE 0.9 % IV SOLN
INTRAVENOUS | Status: DC
Start: 2024-04-16 — End: 2024-04-17

## 2024-04-16 NOTE — H&P (View-Only) (Signed)
 Consultation  Referring Provider: Medicine service/[facility] Primary Care Physician:  [name], MD Primary Gastroenterologist:  Dr.Misenheimer  Reason for Consultation: Severe anemia normocytic   HPI: Louis Neal is a 72 y.o. male who was admitted on 04/14/2024 after outpatient labs showed hemoglobin of 5.5 he was advised by his PCP to come to the hospital.  Patient does have history of atrial fibrillation for which he is on chronic Eliquis , congestive heart failure with preserved EF, diabetes mellitus, previous diagnosis of Barrett's esophagus in chart, sick sinus syndrome status post pacemaker placement, and chronic kidney disease stage IV. He relates that he has had chronic problems with anemia which has been followed by his nephrologist, he has been on oral iron  chronically and also had been on Aranesp injections until April 2025 when they were stopped because his hemoglobin was stable in the 8 range. Does not think he has had his hemoglobin checked since then.  He presented to the hospital also because he was feeling fatigued and short of breath.  He has no specific GI symptoms, specifically no abdominal pain, no recent changes in bowel habits, no nausea or vomiting, no dysphagia.  He says that his stools are dark at times but he is on oral iron , he has never noticed any melena or hematochezia.  Reports that his last EGD was about a year ago per Dr. Larene and that he was told that he had a condition where the blood vessels of the stomach were close to the surface.  That has not been treated.  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 Last EGD and colonoscopy per Dr. Vola to that was 5 to 6 years ago.  He has had history of polyps but was told by Dr. Towana that he should not have to have any further colonoscopies.  Labs here minute hemoglobin 5.9/hematocrit 22.4/MCV 88.5 platelets 333.  He was transfused 2 units of  packed RBCs Globin up to 7.5 posttransfusion, drifted to 7 yesterday and up to 7.6 today  BMM potassium 4.1/sodium 133/BUN 38/creatinine 2.47 Ferritin is 11 Iron  15/TIBC 468/iron  sat 3 B12 812  Did receive an iron  infusion as well here No stools documented for Hemoccult.   Past Medical History:  Diagnosis Date   Acute gout of left foot 07/07/2022   Acute idiopathic gout of right ankle 06/06/2020   Anemia due to stage 3b chronic kidney disease (HCC) 05/11/2019   Formatting of this note might be different from the original.  Updated from 03/08/2019 [facility]     Anemia due to stage 4 chronic kidney disease (HCC) 05/11/2019   Formatting of this note might be different from the original. Updated from 03/08/2019 [facility]   Anemia in stage 3 chronic kidney disease (HCC) 11/26/2022   Aphthous ulcer 06/30/2023   06/30/2023: presumed, uvula     Arthritis    Arthropathy of lumbar facet joint 07/22/2012   Barrett esophagus 02/28/2016   Benign hypertension with chronic kidney disease, stage III (HCC) 03/08/2018   Benign hypertension with CKD (chronic kidney disease) stage IV (HCC) 05/11/2019   Formatting of this note might be different from the original. Updated per 03/08/2019 [facility]   Bilateral lower extremity edema 02/28/2016   Cardiorenal syndrome with renal failure 11/14/2018   Chronic anticoagulation 08/31/2017   CKD (chronic kidney disease) stage 3, GFR 30-59 ml/min (HCC) 11/30/2017   Degeneration of intervertebral disc  of lumbar region 07/22/2012   Degenerative disc disease, lumbar 07/22/2012   Dermatitis 08/24/2022   Formatting of this note might be different from the original. 08/24/2022: arms Formatting of this note might be different from the original. 08/24/2022: arms     Diabetes mellitus without complication (HCC)    Diabetic ulcer of right fifth toe (HCC) 09/18/2019   Diabetic ulcer of right midfoot associated with type 2 diabetes mellitus, with fat  layer exposed (HCC) 03/03/2022   Diaphragmatic hernia 09/04/2016   Elevated brain natriuretic peptide (BNP) level 10/26/2017   Erectile dysfunction 02/04/2021   Formatting of this note might be different from the original.  02/04/2021: likely vascular/DM, with CKD4 limit sildenafil to 20 mg     Essential hypertension 01/14/2016   Formatting of this note might be different from the original. 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 2021: EGD   Gastroesophageal reflux disease without esophagitis 11/28/2015   Overview:  Managed GI  Formatting of this note might be different from the original. Managed GI   GERD (gastroesophageal reflux disease)    Glaucoma 02/28/2016   High risk medication use 01/14/2016   History of diabetic ulcer of foot 06/29/2017   Hyperkalemia 03/08/2018   Hyperlipidemia, mixed 02/28/2016   Hypertension    dr monetta  in [place]   Hypertensive heart disease with heart failure (HCC) 01/14/2016   Hypotension arterial 01/14/2016   Low back pain 07/22/2012   Lumbosacral radiculopathy 07/22/2012   Metabolic bone disease 12/15/2017   Microalbuminuria 12/15/2017   Mobitz type 1 second degree atrioventricular block 10/14/2018   Obstructive sleep apnea 02/28/2016   Obstructive sleep apnea on CPAP 02/28/2016   Formatting of this note might be different from the original. 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  Per 8/5//2020 [facility]     Type 2 diabetes mellitus, with long-term current use of insulin  (HCC) 02/28/2016   Formatting of this note might be different from the original. Per 8/5//2020 [facility]ph[facility]   Ulcer of foot, right, limited to breakdown of skin (HCC) 03/29/2018   Ulcer of right foot, with fat layer exposed (HCC) 08/22/2019   Vitamin D  deficiency 12/15/2017   Wellness examination 07/08/2018    Past Surgical History:  Procedure Laterality Date   CARDIOVERSION N/A 06/21/2020   Procedure: CARDIOVERSION;  Surgeon: Francyne Headland, MD;  Location: [facility] ENDOSCOPY;  Service: Cardiovascular;  Laterality: N/A;   LUMBAR LAMINECTOMY/DECOMPRESSION MICRODISCECTOMY Bilateral 09/22/2012   Procedure: LUMBAR LAMINECTOMY/DECOMPRESSION MICRODISCECTOMY 1 LEVEL;  Surgeon: Reyes JONETTA Budge, MD;  Location: [facility] NEURO ORS;  Service: Neurosurgery;  Laterality: Bilateral;  Lumbar two-three laminectomy   PACEMAKER IMPLANT N/A 05/03/2019   Procedure: PACEMAKER IMPLANT;  Surgeon: Inocencio Soyla Lunger, MD;  Location: [facility] INVASIVE CV LAB;  Service: Cardiovascular;  Laterality: N/A;    Prior to Admission medications   Medication Sig Start Date End Date Taking? Authorizing Provider  amiodarone  (PACERONE ) 200 MG tablet TAKE ONE TABLET BY MOUTH EVERY DAY 01/26/24  Yes Camnitz, Will Gladis, MD  ammonium lactate (LAC-HYDRIN) 12 % lotion Apply 1 application  topically 2 (two) times daily as needed for dry skin. Applied to feet 07/20/17  Yes [provider]  apixaban  (ELIQUIS ) 5 MG TABS tablet Take 1 tablet (5 mg total) by mouth 2 (two) times daily. 11/12/23  Yes Monetta Redell PARAS, MD  Bempedoic Acid  (NEXLETOL ) 180 MG TABS Take 1 tablet (180 mg total) by mouth  daily in the afternoon. 09/27/23  Yes Monetta Redell PARAS, MD  carvedilol  (COREG ) 12.5 MG tablet TAKE ONE TABLET BY MOUTH TWICE DAILY 12/28/23  Yes Monetta Redell PARAS, MD  Cholecalciferol  (VITAMIN D ) 2000 units tablet Take 2,000 Units by mouth daily with breakfast.  11/19/17  Yes [provider]  clobetasol cream (TEMOVATE) 0.05 % Apply 1 Application topically 2 (two) times daily as needed (rash).   Yes [provider]  esomeprazole (NEXIUM) 40 MG capsule Take 40 mg by mouth daily before breakfast.  05/11/17  Yes [provider]  febuxostat (ULORIC) 40 MG tablet Take 1 tablet by mouth daily. 09/06/22  Yes [provider]  ferrous sulfate  325 (65 FE) MG tablet Take 325 mg by mouth daily with breakfast.   Yes [provider]  hydrALAZINE  (APRESOLINE ) 100 MG tablet Take 100 mg by mouth 3 (three) times daily.  11/17/17  Yes [provider]  insulin  glargine, 1 Unit Dial , (TOUJEO  SOLOSTAR) 300 UNIT/ML Solostar Pen Inject 80 Units into the skin daily. 08/05/20  Yes [provider]  JARDIANCE 25 MG TABS tablet Take 25 mg by mouth daily.   Yes [provider]  latanoprost  (XALATAN ) 0.005 % ophthalmic solution Place 1 drop into both eyes at bedtime.  12/18/15  Yes [provider]  omega-3 acid ethyl esters (LOVAZA ) 1 g capsule Take 2 g by mouth 2 (two) times daily. 06/17/20  Yes [provider]  torsemide  (DEMADEX ) 20 MG tablet Take 20 mg by mouth in the morning, at noon, and at bedtime.   Yes [provider]  TRULICITY  4.5 MG/0.5ML SOPN Inject 4.5 mg into the skin once a week. 04/09/22  Yes [provider]  vitamin B-12 (CYANOCOBALAMIN) 1000 MCG tablet Take 1,000 mcg by mouth daily. 04/22/20  Yes [provider]  epoetin alfa (EPOGEN) 20000 UNIT/ML injection Inject 20,000 Units into the skin every 14 (fourteen) days. Patient not taking: Reported on 04/14/2024 08/06/22   [provider]  ISOtretinoin  (ACCUTANE) 40 MG capsule Take 40 mg by mouth daily. Patient not taking: Reported on 04/14/2024 03/06/24   [provider]    Current Facility-Administered Medications  Medication Dose Route Frequency Provider Last Rate Last Admin   amiodarone  (PACERONE ) tablet 200 mg  200 mg Oral Daily Nooruddin, Saad, MD   200 mg at 04/16/24 0945   carvedilol  (COREG ) tablet 12.5 mg  12.5 mg Oral BID Nooruddin, Saad, MD   12.5 mg at 04/16/24 0945   hydrALAZINE  (APRESOLINE ) tablet 100 mg  100 mg Oral TID Nooruddin, Saad, MD   100 mg at 04/16/24 0945   insulin  aspart (novoLOG ) injection 0-15 Units  0-15 Units Subcutaneous TID WC Nooruddin, Saad, MD       insulin  aspart (novoLOG ) injection 0-5 Units  0-5  Units Subcutaneous QHS Nooruddin, Saad, MD       insulin  glargine (LANTUS ) injection 80 Units  80 Units Subcutaneous Daily Nooruddin, Saad, MD   80 Units at 04/16/24 0946   melatonin tablet 3 mg  3 mg Oral QHS PRN Kandis Perkins, DO   3 mg at 04/15/24 2037   pantoprazole  (PROTONIX ) injection 40 mg  40 mg Intravenous Q12H Patel, Amar, DO   40 mg at 04/16/24 9055   torsemide  (DEMADEX ) tablet 20 mg  20 mg Oral TID Tan, Dawson, MD   20 mg at 04/16/24 0945    Allergies as of 04/14/2024 - Review Complete 04/14/2024  Allergen Reaction Noted   Amlodipine  besylate Swelling and Other (See Comments) 04/14/2019   Ezetimibe Other (See Comments) 01/20/2016   Statins Other (See Comments) 09/27/2023    Family History  Problem Relation Age of Onset   Cancer Mother    Liver cancer Father     Social History   Socioeconomic History   Marital status: Married    Spouse name: Not on file   Number of children: Not on file   Years of education: Not on file   Highest education level: Not on file  Occupational History   Not on file  Tobacco Use   Smoking status: Former    Current packs/day: 2.00    Average packs/day: 2.0 packs/day for 5.0 years (10.0 ttl pk-yrs)    Types: Cigarettes, Cigars    Passive exposure:  Past   Smokeless tobacco: Never   Tobacco comments:    quit 10 years ago  Vaping Use   Vaping status: Never Used  Substance and Sexual Activity   Alcohol use: No   Drug use: No   Sexual activity: Not on file  Other Topics Concern   Not on file  Social History Narrative   Not on file   Social Drivers of Health   Financial Resource Strain: Not on file  Food Insecurity: No Food Insecurity (04/14/2024)   Hunger Vital Sign    Worried About Running Out of Food in the Last Year: Never true    Ran Out of Food in the Last Year: Never true  Transportation Needs: No Transportation Needs (04/14/2024)   PRAPARE - Administrator, Civil Service (Medical): No    Lack of Transportation (Non-Medical): No  Physical Activity: Not on file  Stress: Not on file  Social Connections: Patient Declined (04/14/2024)   Social Connection and Isolation Panel    Frequency of Communication with Friends and Family: Patient declined    Frequency of Social Gatherings with Friends and Family: Patient declined    Attends Religious Services: Patient declined    Database administrator or Organizations: Patient declined    Attends Banker Meetings: Patient declined    Marital Status: Patient declined  Intimate Partner Violence: Not At Risk (04/14/2024)   Humiliation, Afraid, Rape, and Kick questionnaire    Fear of Current or Ex-Partner: No    Emotionally Abused: No    Physically Abused: No    Sexually Abused: No    Review of Systems: Pertinent positive and negative review of systems were noted in the above HPI section.  All other review of systems was otherwise negative.  Physical Exam: Vital signs in last 24 hours: Temp:  [98.1 F (36.7 C)-98.5 F (36.9 C)] 98.2 F (36.8 C) (09/14 0819) Pulse Rate:  [62-64] 64 (09/14 0819) Resp:  [18] 18 (09/14 0819) BP: (121-144)/(47-62) 122/47 (09/14 0819)  SpO2:  [94 %-99 %] 94 % (09/14 0819) Last BM Date : 04/16/24 General:   Alert,   Well-developed, well-nourished, elderly white male pleasant and cooperative in NAD, sitting up in the chair Head:  Normocephalic and atraumatic. Eyes:  Sclera clear, no icterus.   Conjunctiva pink. Ears:  Normal auditory acuity. Nose:  No deformity, discharge,  or lesions. Mouth:  No deformity or lesions.   Neck:  Supple; no masses or thyromegaly. Lungs:  Clear throughout to auscultation.   No wheezes, crackles, or rhonchi.  Pacemaker chest wall  Heart:  Regular rate and rhythm; no murmurs, clicks, rubs,  or gallops. Abdomen:  Soft, obese, nontender, BS active,nonpalp mass or hsm.   Rectal: Not done Msk:  Symmetrical without gross deformities. . Pulses:  Normal pulses noted. Extremities:  Without clubbing or edema. Neurologic:  Alert and  oriented x4;  grossly normal neurologically. Skin:  Intact without significant lesions or rashes.. Psych:  Alert and cooperative. Normal mood and affect.  Intake/Output from previous day: 09/13 0701 - 09/14 0700 In: 301.5 [P.O.:240; IV Piggyback:61.5] Out: -  Intake/Output this shift: Total I/O In: 240 [P.O.:240] Out: -   Lab Results: Recent Labs    04/14/24 1031 04/14/24 1046 04/15/24 0453 04/15/24 1438 04/16/24 0308  WBC 5.9  --  5.5  --  6.7  HGB 5.9*   < > 7.2* 7.0* 7.6*  HCT 22.4*   < > 25.4* 24.5* 27.1*  PLT 333  --  308  --  303   < > = values in this interval not displayed.   BMET Recent Labs    04/14/24 1031 04/14/24 1046 04/15/24 0453 04/16/24 0900  NA 135 133* 138 133*  K 4.1 4.1 3.6 4.1  CL 97* 96* 99 94*  CO2 23  --  29 20*  GLUCOSE 162* 163* 70 207*  BUN 43* 46* 41* 38*  CREATININE 2.86* 3.20* 2.52* 2.47*  CALCIUM 8.4*  --  8.6* 8.7*   LFT No results for input(s): PROT, ALBUMIN, AST, ALT, ALKPHOS, BILITOT, BILIDIR, IBILI in the last 72 hours. PT/INR No results for input(s): LABPROT, INR in the last 72 hours. Hepatitis Panel No results for input(s): HEPBSAG, HCVAB, HEPAIGM,  HEPBIGM in the last 72 hours.   IMPRESSION:  #42 73 year old white male with history of chronic anemia in the setting of chronic kidney disease stage IV.  He has been on chronic oral iron  supplementation and had been on Aranesp until it was stopped April 2025 due to stable hemoglobin in the 8 range. Patient admitted now after outpatient labs showed hemoglobin of 5.5  He has been transfused and has received an iron  infusion with iron  studies consistent with iron  deficiency  He has had not had any evidence of active GI bleeding, no Hemoccults here.  He says his stools are dark off and on but he is on oral iron  so difficult for him to tell.  Certainly no overt melena or hematochezia.  He has had prior endoscopic evaluation in [place] by Dr. Towana 5 to 6 years ago with EGD and colonoscopy and had EGD with Dr. Larene last year with a diagnosis of what sounds like watermelon stomach.  I cannot see those reports.  He says this has not been treated.  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 He does have history of colon polyps at prior colonoscopies, last colonoscopy 5 to  6 years ago did have polyps but was told would not need further colonoscopies. In setting of his chronic kidney disease we also need to consider AVMs in other areas of bowel.  #2 congestive heart failure with preserved EF #3.  Chronic anticoagulation-on Eliquis  last dose Friday, 04/14/2024 #4.  Diabetes mellitus #5.  Sick sinus syndrome status post pacemaker #6.  Atrial fibrillation/Eliquis   Plan; clear liquid diet, n.p.o. after midnight Will start bowel prep later there this afternoon and into the evening. Patient will be scheduled for EGD with possible ablation of GAVE if present, and colonoscopy with Dr. Stacia for tomorrow 04/17/2024.  Both procedures were discussed in detail with the  patient including indications risk and benefits and he is agreeable to proceed Continue to hold Eliquis  Continue to trend hemoglobin  Possible he may be able to be discharged tomorrow afternoon post procedures.  GI will follow with you And will plan to follow-up with Dr. Larene on discharge.     Amy EsterwoodPA-C  04/16/2024, 11:11 AM   Attending physician's note  I personally saw the patient and performed a substantive portion of the medical decision making process for this encounter (including a complete performance of the key components : MDM, Hx and Exam), in conjunction with the APP.  I agree with the APP's note, impression, and  the management plan for the number and complexity of problems addressed at the encounter for the patient and take responsibility for that plan with its inherent risk of complications, morbidity, or mortality with additional input as follows.    73 year old male admitted with severe symptomatic anemia, hemoglobin 5.5 denies any overt bleeding.  He has history of congestive heart failure, A-fib on chronic Eliquis , sick sinus syndrome status post pacemaker, CKD stage IV and previous diagnosis of Barrett's esophagus and GAVE.  Severe iron  deficiency anemia, ferritin 11 with low iron   On exam abdomen soft, no distention or tenderness, normal bowel sounds.  Patient will benefit from EGD and colonoscopy to exclude recurrent GAVE or any neoplastic lesion, intervene endoscopically if needed to prevent recurrent bleed Monitor and transfuse hemoglobin to >7 IV iron  Clear liquid diet Bowel prep Hold Eliquis  N.p.o. after midnight  High medical decision making (this includes chart review, review of results, face-to-face time used for counseling as well as treatment plan and follow-up. The patient was provided an opportunity to ask questions and all were answered. The patient agreed with the plan and demonstrated an understanding of the instructions.  LOIS Wilkie Mcgee , MD [phone]

## 2024-04-16 NOTE — Consult Note (Addendum)
 Consultation  Referring Provider: Medicine service/[facility] Primary Care Physician:  [name], MD Primary Gastroenterologist:  Dr.Misenheimer  Reason for Consultation: Severe anemia normocytic   HPI: Louis Neal is a 73 y.o. male who was admitted on 04/14/2024 after outpatient labs showed hemoglobin of 5.5 he was advised by his PCP to come to the hospital.  Patient does have history of atrial fibrillation for which he is on chronic Eliquis , congestive heart failure with preserved EF, diabetes mellitus, previous diagnosis of Barrett's esophagus in chart, sick sinus syndrome status post pacemaker placement, and chronic kidney disease stage IV. He relates that he has had chronic problems with anemia which has been followed by his nephrologist, he has been on oral iron  chronically and also had been on Aranesp injections until April 2025 when they were stopped because his hemoglobin was stable in the 8 range. Does not think he has had his hemoglobin checked since then.  He presented to the hospital also because he was feeling fatigued and short of breath.  He has no specific GI symptoms, specifically no abdominal pain, no recent changes in bowel habits, no nausea or vomiting, no dysphagia.  He says that his stools are dark at times but he is on oral iron , he has never noticed any melena or hematochezia.  Reports that his last EGD was about a year ago per Dr. Larene and that he was told that he had a condition where the blood vessels of the stomach were close to the surface.  That has not been treated.  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 Last EGD and colonoscopy per Dr. Vola to that was 5 to 6 years ago.  He has had history of polyps but was told by Dr. Towana that he should not have to have any further colonoscopies.  Labs here minute hemoglobin 5.9/hematocrit 22.4/MCV 88.5 platelets 333.  He was transfused 2 units of  packed RBCs Globin up to 7.5 posttransfusion, drifted to 7 yesterday and up to 7.6 today  BMM potassium 4.1/sodium 133/BUN 38/creatinine 2.47 Ferritin is 11 Iron  15/TIBC 468/iron  sat 3 B12 812  Did receive an iron  infusion as well here No stools documented for Hemoccult.   Past Medical History:  Diagnosis Date   Acute gout of left foot 07/07/2022   Acute idiopathic gout of right ankle 06/06/2020   Anemia due to stage 3b chronic kidney disease (HCC) 05/11/2019   Formatting of this note might be different from the original.  Updated from 03/08/2019 [facility]     Anemia due to stage 4 chronic kidney disease (HCC) 05/11/2019   Formatting of this note might be different from the original. Updated from 03/08/2019 [facility]   Anemia in stage 3 chronic kidney disease (HCC) 11/26/2022   Aphthous ulcer 06/30/2023   06/30/2023: presumed, uvula     Arthritis    Arthropathy of lumbar facet joint 07/22/2012   Barrett esophagus 02/28/2016   Benign hypertension with chronic kidney disease, stage III (HCC) 03/08/2018   Benign hypertension with CKD (chronic kidney disease) stage IV (HCC) 05/11/2019   Formatting of this note might be different from the original. Updated per 03/08/2019 [facility]   Bilateral lower extremity edema 02/28/2016   Cardiorenal syndrome with renal failure 11/14/2018   Chronic anticoagulation 08/31/2017   CKD (chronic kidney disease) stage 3, GFR 30-59 ml/min (HCC) 11/30/2017   Degeneration of intervertebral disc  of lumbar region 07/22/2012   Degenerative disc disease, lumbar 07/22/2012   Dermatitis 08/24/2022   Formatting of this note might be different from the original. 08/24/2022: arms Formatting of this note might be different from the original. 08/24/2022: arms     Diabetes mellitus without complication (HCC)    Diabetic ulcer of right fifth toe (HCC) 09/18/2019   Diabetic ulcer of right midfoot associated with type 2 diabetes mellitus, with fat  layer exposed (HCC) 03/03/2022   Diaphragmatic hernia 09/04/2016   Elevated brain natriuretic peptide (BNP) level 10/26/2017   Erectile dysfunction 02/04/2021   Formatting of this note might be different from the original.  02/04/2021: likely vascular/DM, with CKD4 limit sildenafil to 20 mg     Essential hypertension 01/14/2016   Formatting of this note might be different from the original. 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 2021: EGD   Gastroesophageal reflux disease without esophagitis 11/28/2015   Overview:  Managed GI  Formatting of this note might be different from the original. Managed GI   GERD (gastroesophageal reflux disease)    Glaucoma 02/28/2016   High risk medication use 01/14/2016   History of diabetic ulcer of foot 06/29/2017   Hyperkalemia 03/08/2018   Hyperlipidemia, mixed 02/28/2016   Hypertension    dr monetta  in [place]   Hypertensive heart disease with heart failure (HCC) 01/14/2016   Hypotension arterial 01/14/2016   Low back pain 07/22/2012   Lumbosacral radiculopathy 07/22/2012   Metabolic bone disease 12/15/2017   Microalbuminuria 12/15/2017   Mobitz type 1 second degree atrioventricular block 10/14/2018   Obstructive sleep apnea 02/28/2016   Obstructive sleep apnea on CPAP 02/28/2016   Formatting of this note might be different from the original. 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  Per 8/5//2020 [facility]phrology [facility]     Type 2 diabetes mellitus, with long-term current use of insulin  (HCC) 02/28/2016   Formatting of this note might be different from the original. Per 8/5//2020 [facility]   Ulcer of foot, right, limited to breakdown of skin (HCC) 03/29/2018   Ulcer of right foot, with fat layer exposed (HCC) 08/22/2019   Vitamin D  deficiency 12/15/2017   Wellness examination 07/08/2018    Past Surgical History:  Procedure Laterality Date   CARDIOVERSION N/A 06/21/2020   Procedure: CARDIOVERSION;  Surgeon: Francyne Headland, MD;  Location: [facility] ENDOSCOPY;  Service: Cardiovascular;  Laterality: N/A;   LUMBAR LAMINECTOMY/DECOMPRESSION MICRODISCECTOMY Bilateral 09/22/2012   Procedure: LUMBAR LAMINECTOMY/DECOMPRESSION MICRODISCECTOMY 1 LEVEL;  Surgeon: Reyes JONETTA Budge, MD;  Location: [facility] NEURO ORS;  Service: Neurosurgery;  Laterality: Bilateral;  Lumbar two-three laminectomy   PACEMAKER IMPLANT N/A 05/03/2019   Procedure: PACEMAKER IMPLANT;  Surgeon: Inocencio Soyla Lunger, MD;  Location: [facility] INVASIVE CV LAB;  Service: Cardiovascular;  Laterality: N/A;    Prior to Admission medications   Medication Sig Start Date End Date Taking? Authorizing Provider  amiodarone  (PACERONE ) 200 MG tablet TAKE ONE TABLET BY MOUTH EVERY DAY 01/26/24  Yes Camnitz, Will Gladis, MD  ammonium lactate (LAC-HYDRIN) 12 % lotion Apply 1 application  topically 2 (two) times daily as needed for dry skin. Applied to feet 07/20/17  Yes [provider]  apixaban  (ELIQUIS ) 5 MG TABS tablet Take 1 tablet (5 mg total) by mouth 2 (two) times daily. 11/12/23  Yes Monetta Redell PARAS, MD  Bempedoic Acid  (NEXLETOL ) 180 MG TABS Take 1 tablet (180 mg total) by mouth  daily in the afternoon. 09/27/23  Yes Monetta Redell PARAS, MD  carvedilol  (COREG ) 12.5 MG tablet TAKE ONE TABLET BY MOUTH TWICE DAILY 12/28/23  Yes Monetta Redell PARAS, MD  Cholecalciferol  (VITAMIN D ) 2000 units tablet Take 2,000 Units by mouth daily with breakfast.  11/19/17  Yes [provider]  clobetasol cream (TEMOVATE) 0.05 % Apply 1 Application topically 2 (two) times daily as needed (rash).   Yes [provider]  esomeprazole (NEXIUM) 40 MG capsule Take 40 mg by mouth daily before breakfast.  05/11/17  Yes [provider]  febuxostat (ULORIC) 40 MG tablet Take 1 tablet by mouth daily. 09/06/22  Yes [provider]  ferrous sulfate  325 (65 FE) MG tablet Take 325 mg by mouth daily with breakfast.   Yes [provider]  hydrALAZINE  (APRESOLINE ) 100 MG tablet Take 100 mg by mouth 3 (three) times daily.  11/17/17  Yes [provider]  insulin  glargine, 1 Unit Dial , (TOUJEO  SOLOSTAR) 300 UNIT/ML Solostar Pen Inject 80 Units into the skin daily. 08/05/20  Yes [provider]  JARDIANCE 25 MG TABS tablet Take 25 mg by mouth daily.   Yes [provider]  latanoprost  (XALATAN ) 0.005 % ophthalmic solution Place 1 drop into both eyes at bedtime.  12/18/15  Yes [provider]  omega-3 acid ethyl esters (LOVAZA ) 1 g capsule Take 2 g by mouth 2 (two) times daily. 06/17/20  Yes [provider]  torsemide  (DEMADEX ) 20 MG tablet Take 20 mg by mouth in the morning, at noon, and at bedtime.   Yes [provider]  TRULICITY  4.5 MG/0.5ML SOPN Inject 4.5 mg into the skin once a week. 04/09/22  Yes [provider]  vitamin B-12 (CYANOCOBALAMIN) 1000 MCG tablet Take 1,000 mcg by mouth daily. 04/22/20  Yes [provider]  epoetin alfa (EPOGEN) 20000 UNIT/ML injection Inject 20,000 Units into the skin every 14 (fourteen) days. Patient not taking: Reported on 04/14/2024 08/06/22   [provider]  ISOtretinoin  (ACCUTANE) 40 MG capsule Take 40 mg by mouth daily. Patient not taking: Reported on 04/14/2024 03/06/24   [provider]    Current Facility-Administered Medications  Medication Dose Route Frequency Provider Last Rate Last Admin   amiodarone  (PACERONE ) tablet 200 mg  200 mg Oral Daily Nooruddin, Saad, MD   200 mg at 04/16/24 0945   carvedilol  (COREG ) tablet 12.5 mg  12.5 mg Oral BID Nooruddin, Saad, MD   12.5 mg at 04/16/24 0945   hydrALAZINE  (APRESOLINE ) tablet 100 mg  100 mg Oral TID Nooruddin, Saad, MD   100 mg at 04/16/24 0945   insulin  aspart (novoLOG ) injection 0-15 Units  0-15 Units Subcutaneous TID WC Nooruddin, Saad, MD       insulin  aspart (novoLOG ) injection 0-5 Units  0-5  Units Subcutaneous QHS Nooruddin, Saad, MD       insulin  glargine (LANTUS ) injection 80 Units  80 Units Subcutaneous Daily Nooruddin, Saad, MD   80 Units at 04/16/24 0946   melatonin tablet 3 mg  3 mg Oral QHS PRN Kandis Perkins, DO   3 mg at 04/15/24 2037   pantoprazole  (PROTONIX ) injection 40 mg  40 mg Intravenous Q12H Patel, Amar, DO   40 mg at 04/16/24 9055   torsemide  (DEMADEX ) tablet 20 mg  20 mg Oral TID Tan, Dawson, MD   20 mg at 04/16/24 0945    Allergies as of 04/14/2024 - Review Complete 04/14/2024  Allergen Reaction Noted   Amlodipine  besylate Swelling and Other (See Comments) 04/14/2019   Ezetimibe Other (See Comments) 01/20/2016   Statins Other (See Comments) 09/27/2023    Family History  Problem Relation Age of Onset   Cancer Mother    Liver cancer Father     Social History   Socioeconomic History   Marital status: Married    Spouse name: Not on file   Number of children: Not on file   Years of education: Not on file   Highest education level: Not on file  Occupational History   Not on file  Tobacco Use   Smoking status: Former    Current packs/day: 2.00    Average packs/day: 2.0 packs/day for 5.0 years (10.0 ttl pk-yrs)    Types: Cigarettes, Cigars    Passive exposure:  Past   Smokeless tobacco: Never   Tobacco comments:    quit 10 years ago  Vaping Use   Vaping status: Never Used  Substance and Sexual Activity   Alcohol use: No   Drug use: No   Sexual activity: Not on file  Other Topics Concern   Not on file  Social History Narrative   Not on file   Social Drivers of Health   Financial Resource Strain: Not on file  Food Insecurity: No Food Insecurity (04/14/2024)   Hunger Vital Sign    Worried About Running Out of Food in the Last Year: Never true    Ran Out of Food in the Last Year: Never true  Transportation Needs: No Transportation Needs (04/14/2024)   PRAPARE - Administrator, Civil Service (Medical): No    Lack of Transportation (Non-Medical): No  Physical Activity: Not on file  Stress: Not on file  Social Connections: Patient Declined (04/14/2024)   Social Connection and Isolation Panel    Frequency of Communication with Friends and Family: Patient declined    Frequency of Social Gatherings with Friends and Family: Patient declined    Attends Religious Services: Patient declined    Database administrator or Organizations: Patient declined    Attends Banker Meetings: Patient declined    Marital Status: Patient declined  Intimate Partner Violence: Not At Risk (04/14/2024)   Humiliation, Afraid, Rape, and Kick questionnaire    Fear of Current or Ex-Partner: No    Emotionally Abused: No    Physically Abused: No    Sexually Abused: No    Review of Systems: Pertinent positive and negative review of systems were noted in the above HPI section.  All other review of systems was otherwise negative.  Physical Exam: Vital signs in last 24 hours: Temp:  [98.1 F (36.7 C)-98.5 F (36.9 C)] 98.2 F (36.8 C) (09/14 0819) Pulse Rate:  [62-64] 64 (09/14 0819) Resp:  [18] 18 (09/14 0819) BP: (121-144)/(47-62) 122/47 (09/14 0819)  SpO2:  [94 %-99 %] 94 % (09/14 0819) Last BM Date : 04/16/24 General:   Alert,   Well-developed, well-nourished, elderly white male pleasant and cooperative in NAD, sitting up in the chair Head:  Normocephalic and atraumatic. Eyes:  Sclera clear, no icterus.   Conjunctiva pink. Ears:  Normal auditory acuity. Nose:  No deformity, discharge,  or lesions. Mouth:  No deformity or lesions.   Neck:  Supple; no masses or thyromegaly. Lungs:  Clear throughout to auscultation.   No wheezes, crackles, or rhonchi.  Pacemaker chest wall  Heart:  Regular rate and rhythm; no murmurs, clicks, rubs,  or gallops. Abdomen:  Soft, obese, nontender, BS active,nonpalp mass or hsm.   Rectal: Not done Msk:  Symmetrical without gross deformities. . Pulses:  Normal pulses noted. Extremities:  Without clubbing or edema. Neurologic:  Alert and  oriented x4;  grossly normal neurologically. Skin:  Intact without significant lesions or rashes.. Psych:  Alert and cooperative. Normal mood and affect.  Intake/Output from previous day: 09/13 0701 - 09/14 0700 In: 301.5 [P.O.:240; IV Piggyback:61.5] Out: -  Intake/Output this shift: Total I/O In: 240 [P.O.:240] Out: -   Lab Results: Recent Labs    04/14/24 1031 04/14/24 1046 04/15/24 0453 04/15/24 1438 04/16/24 0308  WBC 5.9  --  5.5  --  6.7  HGB 5.9*   < > 7.2* 7.0* 7.6*  HCT 22.4*   < > 25.4* 24.5* 27.1*  PLT 333  --  308  --  303   < > = values in this interval not displayed.   BMET Recent Labs    04/14/24 1031 04/14/24 1046 04/15/24 0453 04/16/24 0900  NA 135 133* 138 133*  K 4.1 4.1 3.6 4.1  CL 97* 96* 99 94*  CO2 23  --  29 20*  GLUCOSE 162* 163* 70 207*  BUN 43* 46* 41* 38*  CREATININE 2.86* 3.20* 2.52* 2.47*  CALCIUM 8.4*  --  8.6* 8.7*   LFT No results for input(s): PROT, ALBUMIN, AST, ALT, ALKPHOS, BILITOT, BILIDIR, IBILI in the last 72 hours. PT/INR No results for input(s): LABPROT, INR in the last 72 hours. Hepatitis Panel No results for input(s): HEPBSAG, HCVAB, HEPAIGM,  HEPBIGM in the last 72 hours.   IMPRESSION:  #53 73 year old white male with history of chronic anemia in the setting of chronic kidney disease stage IV.  He has been on chronic oral iron  supplementation and had been on Aranesp until it was stopped April 2025 due to stable hemoglobin in the 8 range. Patient admitted now after outpatient labs showed hemoglobin of 5.5  He has been transfused and has received an iron  infusion with iron  studies consistent with iron  deficiency  He has had not had any evidence of active GI bleeding, no Hemoccults here.  He says his stools are dark off and on but he is on oral iron  so difficult for him to tell.  Certainly no overt melena or hematochezia.  He has had prior endoscopic evaluation in [place] by Dr. Towana 5 to 6 years ago with EGD and colonoscopy and had EGD with Dr. Larene last year with a diagnosis of what sounds like watermelon stomach.  I cannot see those reports.  He says this has not been treated.  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 He does have history of colon polyps at prior colonoscopies, last colonoscopy 5 to  6 years ago did have polyps but was told would not need further colonoscopies. In setting of his chronic kidney disease we also need to consider AVMs in other areas of bowel.  #2 congestive heart failure with preserved EF #3.  Chronic anticoagulation-on Eliquis  last dose Friday, 04/14/2024 #4.  Diabetes mellitus #5.  Sick sinus syndrome status post pacemaker #6.  Atrial fibrillation/Eliquis   Plan; clear liquid diet, n.p.o. after midnight Will start bowel prep later there this afternoon and into the evening. Patient will be scheduled for EGD with possible ablation of GAVE if present, and colonoscopy with Dr. Stacia for tomorrow 04/17/2024.  Both procedures were discussed in detail with the  patient including indications risk and benefits and he is agreeable to proceed Continue to hold Eliquis  Continue to trend hemoglobin  Possible he may be able to be discharged tomorrow afternoon post procedures.  GI will follow with you And will plan to follow-up with Dr. Larene on discharge.     Amy EsterwoodPA-C  04/16/2024, 11:11 AM   Attending physician's note  I personally saw the patient and performed a substantive portion of the medical decision making process for this encounter (including a complete performance of the key components : MDM, Hx and Exam), in conjunction with the APP.  I agree with the APP's note, impression, and  the management plan for the number and complexity of problems addressed at the encounter for the patient and take responsibility for that plan with its inherent risk of complications, morbidity, or mortality with additional input as follows.    73 year old male admitted with severe symptomatic anemia, hemoglobin 5.5 denies any overt bleeding.  He has history of congestive heart failure, A-fib on chronic Eliquis , sick sinus syndrome status post pacemaker, CKD stage IV and previous diagnosis of Barrett's esophagus and GAVE.  Severe iron  deficiency anemia, ferritin 11 with low iron   On exam abdomen soft, no distention or tenderness, normal bowel sounds.  Patient will benefit from EGD and colonoscopy to exclude recurrent GAVE or any neoplastic lesion, intervene endoscopically if needed to prevent recurrent bleed Monitor and transfuse hemoglobin to >7 IV iron  Clear liquid diet Bowel prep Hold Eliquis  N.p.o. after midnight  High medical decision making (this includes chart review, review of results, face-to-face time used for counseling as well as treatment plan and follow-up. The patient was provided an opportunity to ask questions and all were answered. The patient agreed with the plan and demonstrated an understanding of the instructions.  Louis Neal , MD [phone]

## 2024-04-16 NOTE — Progress Notes (Addendum)
 HD#0 Subjective:   Summary: This is a 73 year old male with past medical history of HFpEF, paroxysmal atrial fibrillation on Eliquis , sick sinus syndrome status post pacemaker, type 2 diabetes, Barrett's esophagus who presented with concerns of fatigue and shortness of breath and found to have acute on chronic anemia.  Patient admitted for further evaluation and management.  Overnight Events: No acute events overnight  Patient evaluated at bedside this morning. He states that he is doing well. He did not have a bowel movement this morning. He is not short of breath and he does not have any other concerns this morning. He is wondering when he can go home.   Objective:  Vital signs in last 24 hours: Vitals:   04/15/24 0814 04/15/24 1615 04/15/24 2001 04/16/24 0410  BP: 134/63 (!) 135/50 (!) 144/62 (!) 121/55  Pulse: 65 62 62 63  Resp: 18 18 18 18   Temp:  98.4 F (36.9 C) 98.5 F (36.9 C) 98.1 F (36.7 C)  TempSrc: Oral Oral    SpO2: 99% 99% 97% 98%  Weight:      Height:       Supplemental O2: Room Air SpO2: 98 %   Physical Exam:  Constitutional: Resting in bed sitting up no acute distress, eating breakfast  HENT: normocephalic atraumatic Cardiovascular: regular rate and rhythm, no m/r/g Pulmonary/Chest: normal work of breathing on room air, lungs clear to auscultation bilaterally Abdominal: soft, non-tender, non-distended Extremities: Bilateral lower extremities with 1+ pitting edema  Filed Weights   04/14/24 1027  Weight: 100.7 kg     Intake/Output Summary (Last 24 hours) at 04/16/2024 0601 Last data filed at 04/15/2024 1703 Gross per 24 hour  Intake 301.51 ml  Output --  Net 301.51 ml   Net IO Since Admission: 831.51 mL [04/16/24 0601]  Pertinent Labs:    Latest Ref Rng & Units 04/16/2024    3:08 AM 04/15/2024    2:38 PM 04/15/2024    4:53 AM  CBC  WBC 4.0 - 10.5 K/uL 6.7   5.5   Hemoglobin 13.0 - 17.0 g/dL 7.6  7.0  7.2   Hematocrit 39.0 - 52.0 % 27.1   24.5  25.4   Platelets 150 - 400 K/uL 303   308        Latest Ref Rng & Units 04/15/2024    4:53 AM 04/14/2024   10:46 AM 04/14/2024   10:31 AM  CMP  Glucose 70 - 99 mg/dL 70  836  837   BUN 8 - 23 mg/dL 41  46  43   Creatinine 0.61 - 1.24 mg/dL 7.47  6.79  7.13   Sodium 135 - 145 mmol/L 138  133  135   Potassium 3.5 - 5.1 mmol/L 3.6  4.1  4.1   Chloride 98 - 111 mmol/L 99  96  97   CO2 22 - 32 mmol/L 29   23   Calcium 8.9 - 10.3 mg/dL 8.6   8.4     Imaging: No results found.  Assessment/Plan:   Principal Problem:   Acute on chronic anemia   Patient Summary: Louis Neal is a 73 y.o.  male with past medical history of HFpEF, paroxysmal atrial fibrillation on Eliquis , sick sinus syndrome status post pacemaker, type 2 diabetes, Barrett's esophagus who presented with concerns of fatigue and shortness of breath and found to have acute on chronic anemia.  Patient admitted for further evaluation and management.  #Acute on chronic anemia #Iron  deficiency anemia PM  hemoglobin yesterday 7.0.  Worried about further bleeding.  Patient has not been status post 2 units packed red blood cells.  Patient reports having dark stools, but could be confounded in setting of iron  supplementation.  However given drop, consulted GI.  Will follow-up GI recommendations. - Hold Eliquis  - Start IV PPI twice daily for next 72 hours - Trend H&H - Patient status post 500 mg of IV iron  - Follow-up GI recommendations  #HFpEF Patient volume overloaded yesterday.  Does not have any shortness of breath, and otherwise is feeling well.  Resumed home torsemide . - Continue home carvedilol  12.5 mg twice daily - Continue home hydralazine  100 mg 3 times daily - Continue home torsemide  20 mg 3 times daily  #AKI #CKD stage IV Creatinine trending down.  Patient making good urine. - Continue to monitor  #Paroxysmal atrial fibrillation on Eliquis  #Sick sinus syndrome status post pacemaker Patient stable from  the standpoint.  No concern for RVR.  In setting of concern for GI bleed, holding Eliquis . - Continue amiodarone  200 mg daily - Hold Eliquis   #Type 2 diabetes mellitus Currently on Lantus  80 units daily.  Sugars are measuring well.  No acute concerns. - Continue Lantus  80 units daily - Recommend outpatient doctor to transition to Lantus  40 units twice daily for better absorption  #Hypertension No acute concerns at this time.  Blood pressure measuring well. - Continue Coreg  12.5 mg twice daily - Continue hydralazine  100 mg 3 times daily  #Barrett's esophagus No acute concerns at this moment.  Patient denies any reflux symptoms.  Last endoscopy was reportedly within normal limits.  He can follow-up in the outpatient setting. He takes esomeprazole which we will switch to pantoprazole  while he is in the hospital. - Transition to IV PPI twice daily for the next 72 hours  #Gout No acute concerns. Last uric acid level in April 2025 was wnl. - hold home febuxostat in the setting of AKI  Diet: Normal IVF: None,None VTE: SCDs Code: Full PT/OT recs: None, none.  Dispo: Anticipated discharge to Home in 2 days pending clinical improvement.   Libby Blanch DO Internal Medicine Resident PGY-3 Please contact the on call pager after 5 pm and on weekends at [phone]

## 2024-04-17 ENCOUNTER — Encounter (HOSPITAL_COMMUNITY): Payer: Self-pay | Admitting: Internal Medicine

## 2024-04-17 ENCOUNTER — Other Ambulatory Visit (HOSPITAL_COMMUNITY): Payer: Self-pay

## 2024-04-17 ENCOUNTER — Observation Stay (HOSPITAL_BASED_OUTPATIENT_CLINIC_OR_DEPARTMENT_OTHER): Admitting: Anesthesiology

## 2024-04-17 ENCOUNTER — Observation Stay (HOSPITAL_COMMUNITY): Admitting: Anesthesiology

## 2024-04-17 ENCOUNTER — Encounter (HOSPITAL_COMMUNITY)
Admission: EM | Disposition: A | Discharge status: Home / Self Care | Payer: Self-pay | Source: Ambulatory Visit | Attending: Emergency Medicine

## 2024-04-17 DIAGNOSIS — K3189 Other diseases of stomach and duodenum: Secondary | ICD-10-CM

## 2024-04-17 DIAGNOSIS — K31819 Angiodysplasia of stomach and duodenum without bleeding: Secondary | ICD-10-CM

## 2024-04-17 DIAGNOSIS — N184 Chronic kidney disease, stage 4 (severe): Secondary | ICD-10-CM

## 2024-04-17 DIAGNOSIS — N179 Acute kidney failure, unspecified: Secondary | ICD-10-CM | POA: Diagnosis not present

## 2024-04-17 DIAGNOSIS — K317 Polyp of stomach and duodenum: Secondary | ICD-10-CM | POA: Diagnosis not present

## 2024-04-17 DIAGNOSIS — K635 Polyp of colon: Secondary | ICD-10-CM

## 2024-04-17 DIAGNOSIS — K209 Esophagitis, unspecified without bleeding: Secondary | ICD-10-CM | POA: Diagnosis not present

## 2024-04-17 DIAGNOSIS — D500 Iron deficiency anemia secondary to blood loss (chronic): Secondary | ICD-10-CM | POA: Diagnosis not present

## 2024-04-17 DIAGNOSIS — I130 Hypertensive heart and chronic kidney disease with heart failure and stage 1 through stage 4 chronic kidney disease, or unspecified chronic kidney disease: Secondary | ICD-10-CM

## 2024-04-17 DIAGNOSIS — I5032 Chronic diastolic (congestive) heart failure: Secondary | ICD-10-CM

## 2024-04-17 DIAGNOSIS — D508 Other iron deficiency anemias: Secondary | ICD-10-CM | POA: Diagnosis not present

## 2024-04-17 HISTORY — PX: POLYPECTOMY: SHX149

## 2024-04-17 HISTORY — PX: ESOPHAGOGASTRODUODENOSCOPY: SHX5428

## 2024-04-17 HISTORY — PX: GASTRIC VARICES BANDING: SHX5519

## 2024-04-17 HISTORY — PX: BONE BIOPSY: SHX375

## 2024-04-17 HISTORY — PX: COLONOSCOPY: SHX5424

## 2024-04-17 LAB — CBC
HCT: 30.3 % — ABNORMAL LOW (ref 39.0–52.0)
Hemoglobin: 8.5 g/dL — ABNORMAL LOW (ref 13.0–17.0)
MCH: 24.4 pg — ABNORMAL LOW (ref 26.0–34.0)
MCHC: 28.1 g/dL — ABNORMAL LOW (ref 30.0–36.0)
MCV: 86.8 fL (ref 80.0–100.0)
Platelets: 258 K/uL (ref 150–400)
RBC: 3.49 MIL/uL — ABNORMAL LOW (ref 4.22–5.81)
RDW: 16 % — ABNORMAL HIGH (ref 11.5–15.5)
WBC: 4.6 K/uL (ref 4.0–10.5)
nRBC: 0 % (ref 0.0–0.2)

## 2024-04-17 LAB — GLUCOSE, CAPILLARY: Glucose-Capillary: 209 mg/dL — ABNORMAL HIGH (ref 70–99)

## 2024-04-17 SURGERY — EGD (ESOPHAGOGASTRODUODENOSCOPY)
Anesthesia: Monitor Anesthesia Care

## 2024-04-17 MED ORDER — SUCRALFATE 1 G PO TABS
1.0000 g | ORAL_TABLET | Freq: Three times a day (TID) | ORAL | 0 refills | Status: AC
Start: 2024-04-17 — End: 2024-05-22
  Filled 2024-04-17: qty 42, 14d supply, fill #0

## 2024-04-17 MED ORDER — LIDOCAINE 2% (20 MG/ML) 5 ML SYRINGE
INTRAMUSCULAR | Status: DC | PRN
  Administered 2024-04-17: 60 mg via INTRAVENOUS

## 2024-04-17 MED ORDER — PROPOFOL 500 MG/50ML IV EMUL
INTRAVENOUS | Status: DC | PRN
  Administered 2024-04-17: 200 ug/kg/min via INTRAVENOUS

## 2024-04-17 MED ORDER — PANTOPRAZOLE SODIUM 40 MG PO TBEC
40.0000 mg | DELAYED_RELEASE_TABLET | Freq: Two times a day (BID) | ORAL | 0 refills | Status: DC
  Filled 2024-04-17: qty 60, 30d supply, fill #0

## 2024-04-17 MED ORDER — PHENYLEPHRINE HCL (PRESSORS) 10 MG/ML IV SOLN
INTRAVENOUS | Status: DC | PRN
Start: 2024-04-17 — End: 2024-04-17
  Administered 2024-04-17 (×6): 120 ug via INTRAVENOUS
  Administered 2024-04-17: 160 ug via INTRAVENOUS

## 2024-04-17 NOTE — Op Note (Signed)
 [facility] Patient Name: Louis Neal Procedure Date : 04/17/2024 MRN: [id number] Attending MD: Glendia BRAVO. Stacia , MD, [id number] Date of Birth: September 11, 1950 CSN: [id number] Age: 73 Admit Type: Inpatient Procedure:                Upper GI endoscopy Indications:              Iron  deficiency anemia secondary to chronic blood                            loss Providers:                Glendia E. Stacia, MD, Collene Edu, RN, Felice Sar, Technician Referring MD:              Medicines:                Monitored Anesthesia Care Complications:            No immediate complications. Estimated Blood Loss:     Estimated blood loss was minimal. Procedure:                Pre-Anesthesia Assessment:                           - Prior to the procedure, a History and Physical                            was performed, and patient medications and                            allergies were reviewed. The patient's tolerance of                            previous anesthesia was also reviewed. The risks                            and benefits of the procedure and the sedation                            options and risks were discussed with the patient.                            All questions were answered, and informed consent                            was obtained. Prior Anticoagulants: The patient has                            taken Eliquis  (apixaban ), last dose was 2 days                            prior to procedure. ASA Grade Assessment: III - A  patient with severe systemic disease. After                            reviewing the risks and benefits, the patient was                            deemed in satisfactory condition to undergo the                            procedure.                           After obtaining informed consent, the endoscope was                            passed under direct vision. Throughout the                             procedure, the patient's blood pressure, pulse, and                            oxygen saturations were monitored continuously. The                            [id number]-[id number] ([id number]) Olympus endoscope was introduced                            through the mouth, and advanced to the second part                            of duodenum. The upper GI endoscopy was                            accomplished without difficulty. The patient                            tolerated the procedure well. Scope In: Scope Out: Findings:      The examined portions of the nasopharynx, oropharynx and larynx were       normal.      There were esophageal mucosal changes suspicious for short-segment       Barrett's esophagus present in the lower third of the esophagus. There       was a single tongue of salmon colored mucosa at 41 cm and there were two       islands of salmon colored mucosa at 40 cm. The maximum longitudinal       extent of these mucosal changes was 1 cm in length. No circumferential       Barrett's was appreciated. Mucosa was biopsied with a cold forceps for       histology at 40 and 41 cm from the incisors. A total of 2 specimen       bottles were sent to pathology. Estimated blood loss was minimal.      The exam of the esophagus was otherwise normal.      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 Due to the patient's pacemaker and predominantly       paced rhythm, decision was made not to use APC. 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 The initial banding device was       defective and had to be replaced with a different device. Six bands were       successfully placed along three columns of nodular GAVE mucosa. There       was slight, self-limited oozing following band placement. There was no       bleeding at the end of the maneuver. Estimated blood loss was minimal.      Diffuse mucosal changes  characterized by dark discoloration were found       on the posterior wall of the stomach, extending from the distal antrum       to the proximal gastric body, only involving the posterior wall.       Biopsies were taken with a cold forceps for Helicobacter pylori testing.       Estimated blood loss was minimal.      Five 3 to 8 mm sessile polyps with no bleeding and no stigmata of recent       bleeding were found in the gastric fundus and in the gastric body. These       polyps were erythematous and friable. These polyps were removed with a       cold snare. Resection and retrieval were complete. Estimated blood loss       was minimal.      The exam of the stomach was otherwise normal.      The examined duodenum was normal. Impression:               - The examined portions of the nasopharynx,                            oropharynx and larynx were normal.                           - Esophageal mucosal changes suspicious for                            short-segment Barrett's esophagus. Biopsied.                           - Gastric antral vascular ectasia without bleeding.                            Banded (APC avoided due to presence of cardiac                            pacemaker). I suspect this is the source of the                            patient's iron  deficiency anemia.                           - Discolored mucosa in the posterior wall of the  stomach. 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                            Biopsied.                           - Five gastric polyps. Resected and retrieved.                           - Normal examined duodenum. Moderate Sedation:      N/A Recommendation:           - Return patient to hospital ward for possible                            discharge same day.                           - Resume previous diet.                           - Resume Eliquis  (apixaban ) at prior dose in 2 days                             (Wednesday).                           - Await pathology results.                           - Consider repeat upper endoscopy in 4 weeks for                            retreatment of GAVE. Patient can follow up with                            primary gastroenterologist to discuss further GAVE                            treatment.                           - Use Protonix  (pantoprazole ) 40 mg PO BID for 8                            weeks to allow treatment sites to heal.                           - Use sucralfate  suspension 1 gram PO BID for 2                            weeks.                           - Recommend close outpatient monitoring of CBC with  PCP/outpatient gastroenterologist. Procedure Code(s):        --- Professional ---                           [phone], 59, Esophagogastroduodenoscopy, flexible,                            transoral; with control of bleeding, any method                           43251, Esophagogastroduodenoscopy, flexible,                            transoral; with removal of tumor(s), polyp(s), or                            other lesion(s) by snare technique Diagnosis Code(s):        --- Professional ---                           K22.89, Other specified disease of esophagus                           K31.819, Angiodysplasia of stomach and duodenum                            without bleeding                           K31.89, Other diseases of stomach and duodenum                           K31.7, Polyp of stomach and duodenum                           D50.0, Iron  deficiency anemia secondary to blood                            loss (chronic) CPT copyright 2022 American Medical Association. All rights reserved. The codes documented in this report are preliminary and upon coder review may  be revised to meet current compliance requirements. Damien Batty E. Stacia, MD 04/17/2024 9:01:06 AM This report has been signed electronically. Number of  Addenda: 0

## 2024-04-17 NOTE — Transfer of Care (Signed)
 Immediate Anesthesia Transfer of Care Note  Patient: Louis Neal  Procedure(s) Performed: EGD (ESOPHAGOGASTRODUODENOSCOPY) COLONOSCOPY BAND LIGATION, GASTRIC VARICES POLYPECTOMY, INTESTINE BIOPSY, GI  Patient Location: PACU  Anesthesia Type:MAC  Level of Consciousness: sedated  Airway & Oxygen Therapy: Patient Spontanous Breathing  Post-op Assessment: Report given to RN  Post vital signs: Reviewed and stable  Last Vitals:  Vitals Value Taken Time  BP 106/45 04/17/24 08:47  Temp 36.2 C 04/17/24 08:47  Pulse 62 04/17/24 08:48  Resp 11 04/17/24 08:48  SpO2 98 % 04/17/24 08:48  Vitals shown include unfiled device data.  Last Pain:  Vitals:   04/17/24 0847  TempSrc: Temporal  PainSc:          Complications: No notable events documented.

## 2024-04-17 NOTE — Interval H&P Note (Signed)
 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  The various methods of treatment have been discussed with the patient and family. After consideration of risks, benefits and other options for treatment, the patient has consented to  Procedure(s): EGD (ESOPHAGOGASTRODUODENOSCOPY) (N/A) COLONOSCOPY (N/A) as a surgical intervention.  The patient's history has been reviewed, patient examined, no change in status, stable for surgery.  I have reviewed the patient's chart and labs.  Questions were answered to the patient's satisfaction.     Louis Neal

## 2024-04-17 NOTE — TOC Transition Note (Signed)
 Transition of Care [facility]) - Discharge Note   Patient Details  Name: RAGAN REALE MRN: [id number] Date of Birth: 1950-11-17  [facility] CM/SW Contact:  Tom-Johnson, Harvest Muskrat, RN Phone Number: 04/17/2024, 12:28 PM   Clinical Narrative:     Patient is scheduled for discharge today.  Outpatient f/u, hospital f/u and discharge instructions on AVS. Prescriptions sent to [facility] pharmacy and patient will receive meds prior discharge. No ICM needs or recommendations noted. Family to transport at discharge.  No further ICM needs noted        Final next level of care: Home/Self Care Barriers to Discharge: Barriers Resolved   Patient Goals and CMS Choice Patient states their goals for this hospitalization and ongoing recovery are:: To return home CMS Medicare.gov Compare Post Acute Care list provided to:: Patient Choice offered to / list presented to : NA      Discharge Placement                Patient to be transferred to facility by: Family      Discharge Plan and Services Additional resources added to the After Visit Summary for                  DME Arranged: N/A DME Agency: NA       HH Arranged: NA HH Agency: NA        Social Drivers of Health (SDOH) Interventions SDOH Screenings   Food Insecurity: No Food Insecurity (04/14/2024)  Housing: Low Risk  (04/14/2024)  Transportation Needs: No Transportation Needs (04/14/2024)  Utilities: Not At Risk (04/14/2024)  Social Connections: Patient Declined (04/14/2024)  Tobacco Use: Medium Risk (04/17/2024)     Readmission Risk Interventions     No data to display

## 2024-04-17 NOTE — Op Note (Signed)
 [facility] Patient Name: Louis Neal Procedure Date : 04/17/2024 MRN: [id number] Attending MD: Glendia BRAVO. Stacia , MD, [id number] Date of Birth: [birth year] CSN: [id number] Age: 73 Admit Type: Outpatient Procedure:                Colonoscopy Indications:              Iron  deficiency anemia secondary to chronic blood                            loss Providers:                Glendia E. Stacia, MD, Collene Edu, RN, Felice Sar, Technician Referring MD:              Medicines:                Monitored Anesthesia Care Complications:            No immediate complications. Estimated Blood Loss:     Estimated blood loss was minimal. Procedure:                Pre-Anesthesia Assessment:                           - Prior to the procedure, a History and Physical                            was performed, and patient medications and                            allergies were reviewed. The patient's tolerance of                            previous anesthesia was also reviewed. The risks                            and benefits of the procedure and the sedation                            options and risks were discussed with the patient.                            All questions were answered, and informed consent                            was obtained. Prior Anticoagulants: The patient has                            taken Eliquis  (apixaban ), last dose was 2 days                            prior to procedure. ASA Grade Assessment: III - A  patient with severe systemic disease. After                            reviewing the risks and benefits, the patient was                            deemed in satisfactory condition to undergo the                            procedure.                           After obtaining informed consent, the colonoscope                            was passed under direct vision. Throughout the                             procedure, the patient's blood pressure, pulse, and                            oxygen saturations were monitored continuously. The                            [id number] ([id number] Olympus colonoscope was                            introduced through the anus and advanced to the the                            cecum, identified by appendiceal orifice and                            ileocecal valve. The colonoscopy was performed                            without difficulty. The patient tolerated the                            procedure well. The quality of the bowel                            preparation was fair. The ileocecal valve,                            appendiceal orifice, and rectum were photographed. Scope In: 8:20:06 AM Scope Out: 8:37:58 AM Scope Withdrawal Time: 0 hours 13 minutes 0 seconds  Total Procedure Duration: 0 hours 17 minutes 52 seconds  Findings:      The perianal and digital rectal examinations were normal. Pertinent       negatives include normal sphincter tone and no palpable rectal lesions.      A 6 mm polyp was found in the ascending colon. The polyp was sessile.       The polyp was removed with a cold snare. Resection and retrieval were  complete. Estimated blood loss was minimal.      There was a questionable small (2mm) polyp in the transverse colon.       However, before polypectomy could be performed, the polyp became       obscured by a large amount of solid stool debris and was unable to be       located again.      The exam was otherwise normal throughout the examined colon.      The retroflexed view of the distal rectum and anal verge was normal and       showed no anal or rectal abnormalities. Impression:               - Preparation of the colon was fair.                           - One 6 mm polyp in the ascending colon, removed                            with a cold snare. Resected and retrieved.                           -  Questionable small polyp in proximal transverse                            colon. Lost to visualization and not removed.                           - The distal rectum and anal verge are normal on                            retroflexion view. Moderate Sedation:      N/A Recommendation:           - Return patient to hospital ward for possible                            discharge same day.                           - Resume previous diet.                           - Resume Eliquis  (apixaban ) at prior dose in 2 days.                           - Await pathology results.                           - Repeat colonoscopy in 1 year because the bowel                            preparation was suboptimal and to re-examine for                            suspected small polyp that was not removed. Procedure Code(s):        ---  Professional ---                           [phone], Colonoscopy, flexible; with removal of                            tumor(s), polyp(s), or other lesion(s) by snare                            technique Diagnosis Code(s):        --- Professional ---                           D12.2, Benign neoplasm of ascending colon                           D50.0, Iron  deficiency anemia secondary to blood                            loss (chronic) CPT copyright 2022 American Medical Association. All rights reserved. The codes documented in this report are preliminary and upon coder review may  be revised to meet current compliance requirements. Jazminn Pomales E. Stacia, MD 04/17/2024 9:09:32 AM This report has been signed electronically. Number of Addenda: 0

## 2024-04-17 NOTE — Anesthesia Preprocedure Evaluation (Signed)
 Anesthesia Evaluation  Patient identified by MRN, date of birth, ID band Patient awake    Reviewed: Allergy & Precautions, H&P , NPO status , Patient's Chart, lab work & pertinent test results  Airway Mallampati: II   Neck ROM: full    Dental   Pulmonary sleep apnea , former smoker   breath sounds clear to auscultation       Cardiovascular hypertension, + Peripheral Vascular Disease and +CHF  + dysrhythmias Atrial Fibrillation + pacemaker  Rhythm:regular Rate:Normal     Neuro/Psych  Neuromuscular disease    GI/Hepatic ,GERD  ,,  Endo/Other  diabetes, Type 2    Renal/GU Renal InsufficiencyRenal disease     Musculoskeletal  (+) Arthritis ,    Abdominal   Peds  Hematology  (+) Blood dyscrasia, anemia   Anesthesia Other Findings   Reproductive/Obstetrics                              Anesthesia Physical Anesthesia Plan  ASA: 3  Anesthesia Plan: MAC   Post-op Pain Management:    Induction: Intravenous  PONV Risk Score and Plan: 1 and Propofol  infusion and Treatment may vary due to age or medical condition  Airway Management Planned: Nasal Cannula  Additional Equipment:   Intra-op Plan:   Post-operative Plan:   Informed Consent: I have reviewed the patients History and Physical, chart, labs and discussed the procedure including the risks, benefits and alternatives for the proposed anesthesia with the patient or authorized representative who has indicated his/her understanding and acceptance.     Dental advisory given  Plan Discussed with: CRNA, Anesthesiologist and Surgeon  Anesthesia Plan Comments:         Anesthesia Quick Evaluation

## 2024-04-18 ENCOUNTER — Encounter (HOSPITAL_COMMUNITY): Payer: Self-pay | Admitting: Gastroenterology

## 2024-04-18 LAB — SURGICAL PATHOLOGY

## 2024-04-19 ENCOUNTER — Other Ambulatory Visit: Payer: Self-pay | Admitting: Cardiology

## 2024-04-19 DIAGNOSIS — I480 Paroxysmal atrial fibrillation: Secondary | ICD-10-CM

## 2024-04-19 NOTE — Anesthesia Postprocedure Evaluation (Signed)
 Anesthesia Post Note  Patient: Louis Neal  Procedure(Neal) Performed: EGD (ESOPHAGOGASTRODUODENOSCOPY) COLONOSCOPY BAND LIGATION, GASTRIC VARICES POLYPECTOMY, INTESTINE BIOPSY, GI     Patient location during evaluation: Endoscopy Anesthesia Type: MAC Level of consciousness: awake and alert Pain management: pain level controlled Vital Signs Assessment: post-procedure vital signs reviewed and stable Respiratory status: spontaneous breathing, nonlabored ventilation, respiratory function stable and patient connected to nasal cannula oxygen Cardiovascular status: stable and blood pressure returned to baseline Postop Assessment: no apparent nausea or vomiting Anesthetic complications: no   No notable events documented.  Last Vitals:  Vitals:   04/17/24 0941 04/17/24 1159  BP: (!) 117/104 (!) 120/45  Pulse: 60 63  Resp: 18 18  Temp: (!) 36.3 C 36.5 C  SpO2: 100% 99%    Last Pain:  Vitals:   04/17/24 1159  TempSrc: Oral  PainSc:                  Louis Neal

## 2024-04-19 NOTE — Telephone Encounter (Signed)
 Pt last saw Dr Madireddy 02/23/24, last labs 04/16/24 Creat 2.47, age 73, weight 100.7kg, based on specified criteria pt is on appropriate dosage of Eliquis  5mg  BID for afib.  Will refill rx.

## 2024-04-22 ENCOUNTER — Ambulatory Visit: Payer: Self-pay | Admitting: Gastroenterology

## 2024-04-22 NOTE — Progress Notes (Signed)
 Mr. Louis Neal,  The polyps removed from your stomach were benign, reactive polyps.  These polyps are not considered precancerous and no follow up is needed.  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    Barrett's esophagus is a change in the lining of the esophagus associated with long term acid reflux and is associated with an increased risk of esophageal cancer.  The risk of esophageal cancer is still very low, but it is recommended that patient's with Barrett's esophagus undergo 'surveillance' endoscopies every 3-5 years. It is also recommended that patient's with Barrett's esophagus take a proton pump inhibitor (medication to reduce stomach acid) daily. I recommend you follow up with your primary gastroenterologist and discuss repeating an EGD in 3 years for Barrett's surveillance..  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  This was the likely source of your anemia.  You may benefit from further treatment of the GAVE to reduce the risk of further chronic GI blood loss.   I would suggest a repeat EGD in the next few months with your primary gastroenterologist.  The polyp that was removed from your colon was apparently not retrieved.  You had a lot of fecal material still in your colon, and sometimes small particles of stool in the specimen trap to collect polyps can look like a resected polyp.  The polyp was still removed, but we just were unable to retrieve it.  As discussed, you have another very small polyp that was not removed. As mentioned, I recommend you consider a repeat colonoscopy in 1 year due to the poor prep quality.  Again, this can be done with your primary gastroenterologist.

## 2024-04-24 ENCOUNTER — Other Ambulatory Visit: Payer: Self-pay

## 2024-04-25 MED ORDER — AMIODARONE HCL 200 MG PO TABS: 200.0000 mg | ORAL_TABLET | Freq: Every day | ORAL | 0 refills | Status: DC

## 2024-05-04 ENCOUNTER — Telehealth: Payer: Self-pay

## 2024-05-04 NOTE — Telephone Encounter (Signed)
   Pre-operative Risk Assessment    Patient Name: Louis Neal  DOB: 1950/09/28 MRN: [id number]   Date of last office visit: 02/23/24 Date of next office visit: N/A   Request for Surgical Clearance    Procedure:  EGD  Date of Surgery:  Clearance 05/29/24                                Surgeon:  Dr. Evalene Misenheimer Surgeon's Group or Practice Name:  [facility] Phone number:  [phone] Fax number:  [phone]   Type of Clearance Requested:   - Pharmacy:  Hold Apixaban  (Eliquis ) 2 day prior   Type of Anesthesia:  Not Indicated   Additional requests/questions:    Bonney Calvert Pouch   05/04/2024, 9:36 AM

## 2024-05-04 NOTE — Telephone Encounter (Signed)
 Pharmacy please advise on holding Eliquis  prior to EGD scheduled for 05/29/2024. Thank you.

## 2024-05-05 NOTE — Telephone Encounter (Signed)
 Patient with diagnosis of Afib on Eliquis  for anticoagulation.    Procedure:  EGD   Date of Surgery:  Clearance 05/29/24                   CHA2DS2-VASc Score = 5   This indicates a 7.2% annual risk of stroke. The patient's score is based upon: CHF History: 1 HTN History: 1 Diabetes History: 1 Stroke History: 0 Vascular Disease History: 0 Age Score: 2 Gender Score: 0   CrCl 25 ml/min Platelet count 336 K  Patient has not had an Afib/aflutter ablation within the last 3 months or DCCV within the last 30 days  Per office protocol, patient can hold Eliquis  for 2 days prior to procedure.   Patient will not need bridging with Lovenox (enoxaparin) around procedure.  **This guidance is not considered finalized until pre-operative APP has relayed final recommendations.**

## 2024-05-05 NOTE — Telephone Encounter (Signed)
   Patient Name: Louis Neal  DOB: 08-Mar-1951 MRN: [id number]  Primary Cardiologist: Redell Leiter, MD  Clinical pharmacists have reviewed the patient's past medical history, labs, and current medications as part of preoperative protocol coverage. The following recommendations have been made:  Patient with diagnosis of Afib on Eliquis  for anticoagulation.     Procedure:  EGD   Date of Surgery:  Clearance 05/29/24                     CHA2DS2-VASc Score = 5   This indicates a 7.2% annual risk of stroke. The patient's score is based upon: CHF History: 1 HTN History: 1 Diabetes History: 1 Stroke History: 0 Vascular Disease History: 0 Age Score: 2 Gender Score: 0   CrCl 25 ml/min Platelet count 336 K   Patient has not had an Afib/aflutter ablation within the last 3 months or DCCV within the last 30 days   Per office protocol, patient can hold Eliquis  for 2 days prior to procedure.   Patient will not need bridging with Lovenox (enoxaparin) around procedure.  I will route this recommendation to the requesting party via Epic fax function and remove from pre-op pool.  Please call with questions.  Lamarr Satterfield, NP 05/05/2024, 3:26 PM

## 2024-05-08 ENCOUNTER — Ambulatory Visit: Payer: Medicare PPO

## 2024-05-08 DIAGNOSIS — I480 Paroxysmal atrial fibrillation: Secondary | ICD-10-CM

## 2024-05-09 LAB — CUP PACEART REMOTE DEVICE CHECK
Battery Remaining Longevity: 36 mo
Battery Remaining Percentage: 39 %
Battery Voltage: 2.98 V
Brady Statistic AP VP Percent: 67 %
Brady Statistic AP VS Percent: 1 %
Brady Statistic AS VP Percent: 33 %
Brady Statistic AS VS Percent: 1 %
Brady Statistic RA Percent Paced: 66 %
Brady Statistic RV Percent Paced: 99 %
Date Time Interrogation Session: 20251006020011
Implantable Lead Connection Status: 753985
Implantable Lead Connection Status: 753985
Implantable Lead Implant Date: 20200930
Implantable Lead Implant Date: 20200930
Implantable Lead Location: 753859
Implantable Lead Location: 753860
Implantable Pulse Generator Implant Date: 20200930
Lead Channel Impedance Value: 330 Ohm
Lead Channel Impedance Value: 480 Ohm
Lead Channel Pacing Threshold Amplitude: 0.75 V
Lead Channel Pacing Threshold Amplitude: 1.25 V
Lead Channel Pacing Threshold Pulse Width: 0.5 ms
Lead Channel Pacing Threshold Pulse Width: 0.8 ms
Lead Channel Sensing Intrinsic Amplitude: 1.7 mV
Lead Channel Sensing Intrinsic Amplitude: 4 mV
Lead Channel Setting Pacing Amplitude: 2 V
Lead Channel Setting Pacing Amplitude: 2.5 V
Lead Channel Setting Pacing Pulse Width: 0.5 ms
Lead Channel Setting Sensing Sensitivity: 2 mV
Pulse Gen Model: 2272
Pulse Gen Serial Number: 9159064

## 2024-05-09 NOTE — Progress Notes (Signed)
 Remote PPM Transmission

## 2024-05-14 ENCOUNTER — Ambulatory Visit: Payer: Self-pay | Admitting: Cardiology

## 2024-05-15 NOTE — Progress Notes (Signed)
 Remote PPM Transmission

## 2024-05-18 NOTE — Telephone Encounter (Signed)
 In coming call via surescripts  from [facility]. Confirmed patient's identity using two identifiers.   S: Refill Request:Uloric   B: Last seen by Dr Myrick on 04/27/24      Next appt scheduled on: 06/27/24  1. Type 2 diabetes mellitus with diabetic polyneuropathy, with long-term current use of insulin     (CMD)        2. History of diabetic ulcer of foot      PLAN:   Discussed treatment options with the patient and have recommended the following:  Louis Neal is a pleasant, 73 y.o. diabetic male who presents today for f/u right forefoot ulceration.  Clinical exam remains consistent with resolved partial thickness ulceration plantar right foot.   He has profound diabetic peripheral neuropathy.  No debridement performed today.     Continue dressings to site daily, and recommend continued use of mole skin for protection.        He may continue use of diabetic shoes with diabetic innersoles.      Encouraged to limit walking to only what is necessary for daily activity.      Daily foot inspection.    Return to office in 2 months or sooner if need is apparent.     1) Wash feet daily with warm water and mild soap. Test water temp with thermometer or elbow first. Dry feet carefully and completely 2) Inspect feet daily for red marks. bruises, blisters, sores, cuts, scratches. Use mirror if necessary to see bottom of foot. If eyesight is poor, as family member to help. 3) Trim toenails straight across. 4) Wear properly fitted shoes with soft uppers and plenty of room for the toes. Do not wear shoes with out socks. Inspect inside of shoes for foreign objects, before wearing them.   A: Medication requested:Uloric       R: Forwarding request to provider       Pt/Caller made aware of plan and refill policy stating to allow 48-72 hrs for completion       Pharmacy confirmed jd:[facility] - [place], [place] - [address]

## 2024-05-21 NOTE — Progress Notes (Unsigned)
  Electrophysiology Office Note:   Date:  05/22/2024  ID:  WAYLON HERSHEY, DOB 15-Mar-1951, MRN [id number]  Primary Cardiologist: Redell Leiter, MD Primary Heart Failure: None Electrophysiologist: Toyia Jelinek Gladis Norton, MD      History of Present Illness:   Louis Neal is a 73 y.o. male with h/o atrial fibrillation, sick sinus syndrome, hypertension, CKD stage IV seen today for routine electrophysiology followup.   Since last being seen in our clinic the patient reports doing well.  He was discharged from the hospital September 2025 after GI bleeding.  He required IV iron  and 2 units of blood.  Hemoglobin initially was 5.9.  He has had a significant recovery.  His energy level has significantly improved.  On device interrogation, he has had no arrhythmias.  No cardiac complaints at this time.  he denies chest pain, palpitations, dyspnea, PND, orthopnea, nausea, vomiting, dizziness, syncope, edema, weight gain, or early satiety.   Review of systems complete and found to be negative unless listed in HPI.      EP Information / Studies Reviewed:    EKG is not ordered today. EKG from 04/14/2024 reviewed which showed sinus rhythm, ventricular paced      PPM Interrogation-  reviewed in detail today,  See PACEART report.  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 The patient's score is based upon: CHF History: 1 HTN History: 1 Diabetes History: 1 Stroke History: 0 Vascular Disease History: 0 Age Score: 2 Gender Score: 0            Physical Exam:   VS:  BP 130/62   Pulse 63   Ht 5' 10 (1.778 m)   Wt 208 lb 3.2 oz (94.4 kg)   SpO2 97%   BMI 29.87 kg/m    Wt Readings from Last 3 Encounters:  05/22/24 208 lb 3.2 oz (94.4 kg)  04/14/24 222 lb (100.7 kg)  02/23/24 216 lb 3.2 oz (98.1 kg)     GEN: Well nourished, well developed  in no acute distress NECK: No JVD; No carotid bruits CARDIAC: Regular rate and rhythm, no murmurs, rubs, gallops RESPIRATORY:  Clear to auscultation without rales, wheezing or rhonchi  ABDOMEN: Soft, non-tender, non-distended EXTREMITIES:  No edema; No deformity   ASSESSMENT AND PLAN:    SND s/p Abbott PPM  Normal PPM function See Pace Art report No changes today  2.  Paroxysmal atrial fibrillation: On amiodarone .  Remains in sinus rhythm.  3.  Secondary hypercoagulable state: On Eliquis   4.  High-risk medication monitoring: [facility] [place] check TSH and LFTs today.  5.  Hypertension: Well-controlled  6.  CKD stage IV: Followed at [facility]  Disposition:   Follow up with EP Team in 12 months  Signed, Keonda Dow Gladis Norton, MD

## 2024-05-22 ENCOUNTER — Ambulatory Visit: Attending: Cardiology | Admitting: Cardiology

## 2024-05-22 ENCOUNTER — Encounter: Payer: Self-pay | Admitting: Cardiology

## 2024-05-22 VITALS — BP 130/62 | HR 63 | Ht 70.0 in | Wt 208.2 lb

## 2024-05-22 DIAGNOSIS — I480 Paroxysmal atrial fibrillation: Secondary | ICD-10-CM

## 2024-05-22 DIAGNOSIS — Z79899 Other long term (current) drug therapy: Secondary | ICD-10-CM

## 2024-05-22 DIAGNOSIS — I442 Atrioventricular block, complete: Secondary | ICD-10-CM

## 2024-05-22 DIAGNOSIS — D6869 Other thrombophilia: Secondary | ICD-10-CM

## 2024-05-22 DIAGNOSIS — I10 Essential (primary) hypertension: Secondary | ICD-10-CM

## 2024-05-22 LAB — CUP PACEART INCLINIC DEVICE CHECK
Battery Remaining Longevity: 32 mo
Battery Voltage: 2.96 V
Brady Statistic RA Percent Paced: 66 %
Brady Statistic RV Percent Paced: 99.99 %
Date Time Interrogation Session: 20251020084908
Implantable Lead Connection Status: 753985
Implantable Lead Connection Status: 753985
Implantable Lead Implant Date: 20200930
Implantable Lead Implant Date: 20200930
Implantable Lead Location: 753859
Implantable Lead Location: 753860
Implantable Pulse Generator Implant Date: 20200930
Lead Channel Impedance Value: 362.5 Ohm
Lead Channel Impedance Value: 525 Ohm
Lead Channel Pacing Threshold Amplitude: 0.75 V
Lead Channel Pacing Threshold Amplitude: 0.75 V
Lead Channel Pacing Threshold Amplitude: 1.25 V
Lead Channel Pacing Threshold Amplitude: 1.25 V
Lead Channel Pacing Threshold Pulse Width: 0.5 ms
Lead Channel Pacing Threshold Pulse Width: 0.5 ms
Lead Channel Pacing Threshold Pulse Width: 0.8 ms
Lead Channel Pacing Threshold Pulse Width: 0.8 ms
Lead Channel Sensing Intrinsic Amplitude: 2.6 mV
Lead Channel Sensing Intrinsic Amplitude: 4 mV
Lead Channel Setting Pacing Amplitude: 2 V
Lead Channel Setting Pacing Amplitude: 2.5 V
Lead Channel Setting Pacing Pulse Width: 0.8 ms
Lead Channel Setting Sensing Sensitivity: 2 mV
Pulse Gen Model: 2272
Pulse Gen Serial Number: 9159064

## 2024-05-22 NOTE — Patient Instructions (Signed)
 Medication Instructions:  Your physician recommends that you continue on your current medications as directed. Please refer to the Current Medication list given to you today.  *If you need a refill on your cardiac medications before your next appointment, please call your pharmacy*  Lab Work: Your physician recommends that you return for lab work in: 6 months for Amiodarone  surveillance labs - TSH & LFTs  If you have any lab test that is abnormal or we need to change your treatment, we will call you to review the results.  Testing/Procedures: None ordered  Follow-Up: At [facility], you and your health needs are our priority.  As part of our continuing mission to provide you with exceptional heart care, our providers are all part of one team.  This team includes your primary Cardiologist (physician) and Advanced Practice Providers or APPs (Physician Assistants and Nurse Practitioners) who all work together to provide you with the care you need, when you need it.  Remote monitoring is used to monitor your Pacemaker or ICD from home. This monitoring reduces the number of office visits required to check your device to one time per year. It allows us  to keep an eye on the functioning of your device to ensure it is working properly. You are scheduled for a device check from home on 08/08/2023. You may send your transmission at any time that day. If you have a wireless device, the transmission will be sent automatically. After your physician reviews your transmission, you will receive a postcard with your next transmission date.   Your next appointment:   1 year(s)  Provider:   Soyla Norton, MD     Thank you for choosing [facility]!!   Maeola Domino, RN [phone]

## 2024-05-29 ENCOUNTER — Other Ambulatory Visit: Payer: Self-pay

## 2024-05-30 ENCOUNTER — Ambulatory Visit: Payer: Self-pay | Admitting: Cardiology

## 2024-05-30 MED ORDER — AMIODARONE HCL 200 MG PO TABS: 200.0000 mg | ORAL_TABLET | Freq: Every day | ORAL | 3 refills | Status: AC

## 2024-08-07 ENCOUNTER — Ambulatory Visit: Payer: Medicare PPO

## 2024-08-07 DIAGNOSIS — I480 Paroxysmal atrial fibrillation: Secondary | ICD-10-CM

## 2024-08-07 NOTE — Progress Notes (Signed)
 "   Date:  08/08/2024   ID:  Louis Neal, DOB April 27, 1951, MRN [id number]  PCP:  Louis Louis CROME, MD  Cardiologist:  Redell Leiter, MD    Referring MD: Louis Louis CROME, MD please do TSH free T3 free T4 on amiodarone  with his upcoming labs copy to me   ASSESSMENT:    1. SSS (sick sinus syndrome) (HCC)   2. Pacemaker - STJ   3. PAF (paroxysmal atrial fibrillation) (HCC)   4. On amiodarone  therapy   5. Chronic anticoagulation   6. Hypertensive heart disease with heart failure (HCC)   7. CKD (chronic kidney disease) stage 4, GFR 15-29 ml/min (HCC)   8. Anemia due to stage 5 chronic kidney disease, not on chronic dialysis (HCC)    PLAN:    In order of problems listed above:  Doing well since permanent pacemaker no recurrent atrial fibrillation 100% ventricularly paced heart failure is compensated renal function  He has an upcoming appointment PCP and labs I will ask his PCP to check a full thyroid  panel TSH free T4 free T3 with amiodarone  therapy Continue anticoagulation if recurrent bleeding would benefit from Watchman device Hypertension is well-controlled at target heart failure compensated no shortness of breath or edema continues multiple antihypertensives carvedilol  loop diuretic hydralazine  CKD managed by his PCP Improved anemia Lipids at target on his current lipid-lowering therapy bempedoic acid   Next appointment: 9 months   Medication Adjustments/Labs and Tests Ordered: Current medicines are reviewed at length with the patient today.  Concerns regarding medicines are outlined above.  Orders Placed This Encounter  Procedures   EKG 12-Lead   No orders of the defined types were placed in this encounter.    History of Present Illness:    NAI DASCH is a 74 y.o. male with a hx of sick sinus syndrome permanent dual-chamber pacemaker for bradycardia paroxysmal atrial fibrillation suppressed with low-dose amiodarone  chronic anticoagulation hypertensive heart disease  with heart failure and stage IV CKD followed by [facility] nephrology hyperlipidemia and anemia with chronic disease last seen 08/26/2023.  Recent labs 05/15/2024 creatinine 1.93 GFR 36 cc/min potassium 4.4 sodium 138 hemoglobin 12.4 platelets normal 282,000 Cholesterol 158 LDL 89 8 non HDL cholesterol 124.  Compliance with diet, lifestyle and medications: Yes  He relates to me that in management severely anemic hemoglobin in the range of 5 require transfusion admission and endoscopic procedures for GAVE He feels markedly improved he has no edema shortness of breath chest pain lightheadedness or syncope I gave him the option of considering Watchman device if he has a recurrent GI bleed Kidney function is improved Past Medical History:  Diagnosis Date   Acute gout of left foot 07/07/2022   Acute idiopathic gout of right ankle 06/06/2020   Anemia due to stage 3b chronic kidney disease (HCC) 05/11/2019   Formatting of this note might be different from the original.  Updated from 03/08/2019 [facility]     Anemia due to stage 4 chronic kidney disease (HCC) 05/11/2019   Formatting of this note might be different from the original. 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 08/24/2022: arms Formatting of this note might be different from the original.  08/24/2022: arms     Diabetes mellitus without complication (HCC)    Diabetic ulcer of right fifth toe (HCC) 09/18/2019   Diabetic ulcer of right midfoot associated with type 2 diabetes mellitus, with fat layer exposed (HCC) 03/03/2022   Diaphragmatic hernia 09/04/2016   Elevated brain natriuretic peptide (BNP) level 10/26/2017   Erectile dysfunction 02/04/2021   Formatting of this note might be different from the original.  02/04/2021: likely vascular/DM, with CKD4 limit sildenafil to 20 mg     Essential hypertension 01/14/2016   Formatting of this note might be different from the original. Managed CARDS   Facet arthropathy, lumbar 07/22/2012   Gastroesophageal reflux disease without esophagitis 11/28/2015   Overview:  Managed GI  Formatting of this note might be different from the original. Managed GI   GERD (gastroesophageal reflux disease)    Glaucoma 02/28/2016   High risk medication use 01/14/2016   History of diabetic ulcer of foot 06/29/2017   Hyperkalemia 03/08/2018   Hyperlipidemia, mixed 02/28/2016   Hypertension    dr monetta  in [place]   Hypertensive heart disease with heart failure (HCC) 01/14/2016   Hypotension arterial 01/14/2016   Low back pain 07/22/2012   Lumbosacral radiculopathy 07/22/2012   Metabolic bone disease 12/15/2017   Microalbuminuria 12/15/2017   Mobitz type 1 second degree atrioventricular block 10/14/2018   Obstructive sleep apnea 02/28/2016   Obstructive sleep apnea on CPAP 02/28/2016   Formatting of this note might be different from the original. 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  Per 8/5//2020 [facility]phrology VN     Type 2 diabetes mellitus, with long-term current use of insulin  (HCC) 02/28/2016   Formatting of this note might be different from the original. Per 8/5//2020 [facility]N   Ulcer of foot, right, limited to breakdown of skin (HCC) 03/29/2018   Ulcer of right foot, with fat layer exposed (HCC) 08/22/2019   Vitamin D  deficiency 12/15/2017   Wellness examination 07/08/2018    Current Medications: Active Medications[1]    EKGs/Labs/Other Studies Reviewed:    The following studies were reviewed today:  Cardiac Studies & Procedures   ______________________________________________________________________________________________   STRESS TESTS  MYOCARDIAL PERFUSION IMAGING 01/27/2023  Interpretation Summary   No evidence of ischemia. The study is low risk.   Left ventricular function is normal. Nuclear stress EF: 54 %. End diastolic cavity size is normal.   ECHOCARDIOGRAM  ECHOCARDIOGRAM COMPLETE 02/18/2023  Narrative ECHOCARDIOGRAM REPORT    Patient Name:   Louis Neal  Date of Exam: 02/18/2023 Medical Rec #:  [id number]      Height:       70.0 in Accession #:    [id number]     Weight:       221.0 lb Date of Birth:  1951/04/01      BSA:          2.178 m Patient Age:    72 years       BP:           116/58 mmHg Patient Gender: M              HR:           60 bpm. Exam Location:  [place]  Procedure: 2D Echo, Cardiac Doppler, Color Doppler and Strain Analysis  Indications:    SSS (sick sinus syndrome) (HCC) [I49.5 (ICD-10-CM)]; Pacemaker - STJ [Z95.0 (ICD-10-CM)]; PAF (paroxysmal atrial fibrillation) (HCC)  [I48.0 (ICD-10-CM)]; Chronic anticoagulation [Z79.01 (ICD-10-CM)]; On amiodarone  therapy [Z79.899 (ICD-10-CM)]; Hypertensive heart disease with heart failure (HCC) [I11.0 (ICD-10-CM)]; Hypertensive kidney disease with stage 3b chronic kidney disease (HCC) [I12.9, N18.32 (ICD-10-CM)]  History:        Patient has prior history of Echocardiogram examinations, most recent 03/11/2017. Arrythmias:Atrial Fibrillation; Risk Factors:Hypertension.  Sonographer:    Charlie Jointer RDCS Referring Phys: [id number] [place] J Leoma Folds  IMPRESSIONS   1. Left ventricular ejection fraction, by estimation, is 60 to 65%. The left ventricle has normal function. The left ventricle has no regional wall motion abnormalities. There is mild left ventricular hypertrophy. Left ventricular diastolic parameters were normal. The average left ventricular global longitudinal strain is -16.0 %. The global longitudinal strain is abnormal. 2. Right ventricular systolic function is normal. The right ventricular size is normal. There is mildly elevated pulmonary artery systolic pressure. 3. Left atrial size was mildly dilated. 4. The mitral valve is normal in structure. Mild mitral valve regurgitation. No evidence of mitral stenosis. 5. The aortic valve is normal in structure. Aortic valve regurgitation is not visualized. No aortic stenosis is present. 6. The inferior vena cava is normal in size with greater than 50% respiratory variability, suggesting right atrial pressure of 3 mmHg.  FINDINGS Left Ventricle: Left ventricular ejection fraction, by estimation, is 60 to 65%. The left ventricle has normal function. The left ventricle has no regional wall motion abnormalities. The average left ventricular global longitudinal strain is -16.0 %. The global longitudinal strain is abnormal. The left ventricular internal cavity size was normal in size. There is mild left ventricular hypertrophy. Left ventricular diastolic parameters were  normal.  Right Ventricle: The right ventricular size is normal. No increase in right ventricular wall thickness. Right ventricular systolic function is normal. There is mildly elevated pulmonary artery systolic pressure. The tricuspid regurgitant velocity is 2.75 m/s, and with an assumed right atrial pressure of 8 mmHg, the estimated right ventricular systolic pressure is 38.2 mmHg.  Left Atrium: Left atrial size was mildly dilated.  Right Atrium: Right atrial size was normal in size.  Pericardium: There is no evidence of pericardial effusion.  Mitral Valve: The mitral valve is normal in structure. Mild mitral valve regurgitation. No evidence of mitral valve stenosis.  Tricuspid Valve: The tricuspid valve is normal in structure. Tricuspid valve regurgitation is mild . No evidence of tricuspid stenosis.  Aortic Valve: The aortic valve is normal in structure. Aortic valve regurgitation is not visualized. No aortic stenosis is present.  Pulmonic Valve: The pulmonic valve was normal in structure. Pulmonic valve regurgitation is not  visualized. No evidence of pulmonic stenosis.  Aorta: The aortic root is normal in size and structure.  Venous: The inferior vena cava is normal in size with greater than 50% respiratory variability, suggesting right atrial pressure of 3 mmHg.  IAS/Shunts: No atrial level shunt detected by color flow Doppler.   LEFT VENTRICLE PLAX 2D LVIDd:         4.70 cm   Diastology LVIDs:         3.20 cm   LV e' medial:    9.57 cm/s LV PW:         1.20 cm   LV E/e' medial:  9.8 LV IVS:        1.70 cm   LV e' lateral:   9.68 cm/s LVOT diam:     2.10 cm   LV E/e' lateral: 9.7 LV SV:         104 LV SV Index:   48        2D Longitudinal Strain LVOT Area:     3.46 cm  2D Strain GLS Avg:     -16.0 %   RIGHT VENTRICLE             IVC RV Basal diam:  3.30 cm     IVC diam: 2.70 cm RV Mid diam:    2.40 cm RV S prime:     14.85 cm/s TAPSE (M-mode): 2.8 cm  LEFT ATRIUM              Index        RIGHT ATRIUM           Index LA diam:        4.30 cm 1.97 cm/m   RA Area:     17.80 cm LA Vol (A2C):   66.8 ml 30.67 ml/m  RA Volume:   44.90 ml  20.62 ml/m LA Vol (A4C):   77.5 ml 35.59 ml/m LA Biplane Vol: 79.1 ml 36.32 ml/m AORTIC VALVE LVOT Vmax:   134.50 cm/s LVOT Vmean:  86.900 cm/s LVOT VTI:    0.302 m  AORTA Ao Root diam: 3.60 cm Ao Asc diam:  3.50 cm Ao Desc diam: 2.60 cm  MITRAL VALVE                  TRICUSPID VALVE MV Area (PHT): 3.65 cm       TR Peak grad:   30.2 mmHg MV Decel Time: 208 msec       TR Vmax:        275.00 cm/s MR Peak grad:    107.7 mmHg MR Vmax:         519.00 cm/s  SHUNTS MR PISA:         1.57 cm     Systemic VTI:  0.30 m MR PISA Eff ROA: 12 mm       Systemic Diam: 2.10 cm MR PISA Radius:  0.50 cm MV E velocity: 93.65 cm/s MV A velocity: 69.20 cm/s MV E/A ratio:  1.35  Louis Fitch MD Electronically signed by Louis Fitch MD Signature Date/Time: 02/18/2023/2:52:38 PM    Final    MONITORS  LONG TERM MONITOR (3-14 DAYS) 09/14/2018  Narrative A ZIO monitor was performed for 3 days starting 09/14/2018.  Predominant rhythm is sinus with minimum average and maximum heart rates of 57, 71 and 92 bpm.  There are episodes of Mobitz 1 second-degree heart block seen.  There are no episodes of pauses 3 seconds or greater or  high degree sinus node or AV AV or sinus node block.  Atrial fibrillation/flutter is present with a 2% burden average rate 46 bpm rate varying from 30 to 76 bpm.  Rare isolated PVCs are present.  Rare isolated APCs are present.  There were no triggered or diary events.   Conclusion paroxysmal atrial fibrillation with a relatively slow response, Mobitz 1 second-degree AV block.       ______________________________________________________________________________________________      EKG Interpretation Date/Time:  Tuesday August 08 2024 07:50:37 EST Ventricular Rate:  69 PR  Interval:    QRS Duration:  232 QT Interval:  552 QTC Calculation: 591 R Axis:   -53  Text Interpretation: Ventricular-paced rhythm Atrial sensed 100% ventricular capture RV pacemaker origin When compared with ECG of 14-Apr-2024 13:18, PREVIOUS ECG IS PRESENT Confirmed by Monetta Rogue ([id number]) on 08/08/2024 7:53:15 AM   Recent Labs: 04/16/2024: BUN 38; Creatinine, Ser 2.47; Magnesium 2.4; Potassium 4.1; Sodium 133 04/17/2024: Hemoglobin 8.5; Platelets 258  Recent Lipid Panel No results found for: CHOL, TRIG, HDL, CHOLHDL, VLDL, LDLCALC, LDLDIRECT  Physical Exam:    VS:  BP 122/60   Pulse 69   Ht 5' 10 (1.778 m)   Wt 215 lb (97.5 kg)   SpO2 96%   BMI 30.85 kg/m     Wt Readings from Last 3 Encounters:  08/08/24 215 lb (97.5 kg)  05/22/24 208 lb 3.2 oz (94.4 kg)  04/14/24 222 lb (100.7 kg)     GEN:  Well nourished, well developed in no acute distress marked improvement in his appearance no longer looks ill HEENT: Normal NECK: No JVD; No carotid bruits LYMPHATICS: No lymphadenopathy CARDIAC: RRR, no murmurs, rubs, gallops RESPIRATORY:  Clear to auscultation without rales, wheezing or rhonchi  ABDOMEN: Soft, non-tender, non-distended MUSCULOSKELETAL:  No edema; No deformity  SKIN: Warm and dry NEUROLOGIC:  Alert and oriented x 3 PSYCHIATRIC:  Normal affect    Signed, Rogue Monetta, MD  08/08/2024 7:53 AM    [facility]      [1]  Current Meds  Medication Sig   ACCU-CHEK AVIVA PLUS test strip 1 each 4 (four) times daily.   Continuous Glucose Sensor (FREESTYLE LIBRE 3 PLUS SENSOR) MISC SMARTSIG:1 Topical Daily on Weekdays   esomeprazole (NEXIUM) 40 MG capsule Take 40 mg by mouth daily.   ISOtretinoin (ACCUTANE) 30 MG capsule Take 30 mg by mouth daily.   "

## 2024-08-08 ENCOUNTER — Encounter: Payer: Self-pay | Admitting: Cardiology

## 2024-08-08 ENCOUNTER — Ambulatory Visit: Attending: Cardiology | Admitting: Cardiology

## 2024-08-08 VITALS — BP 122/60 | HR 69 | Ht 70.0 in | Wt 215.0 lb

## 2024-08-08 DIAGNOSIS — I110 Hypertensive heart disease with heart failure: Secondary | ICD-10-CM

## 2024-08-08 DIAGNOSIS — I495 Sick sinus syndrome: Secondary | ICD-10-CM | POA: Diagnosis not present

## 2024-08-08 DIAGNOSIS — N185 Chronic kidney disease, stage 5: Secondary | ICD-10-CM | POA: Diagnosis not present

## 2024-08-08 DIAGNOSIS — Z7901 Long term (current) use of anticoagulants: Secondary | ICD-10-CM | POA: Diagnosis not present

## 2024-08-08 DIAGNOSIS — D631 Anemia in chronic kidney disease: Secondary | ICD-10-CM | POA: Diagnosis not present

## 2024-08-08 DIAGNOSIS — N184 Chronic kidney disease, stage 4 (severe): Secondary | ICD-10-CM | POA: Diagnosis not present

## 2024-08-08 DIAGNOSIS — I480 Paroxysmal atrial fibrillation: Secondary | ICD-10-CM

## 2024-08-08 DIAGNOSIS — Z950 Presence of cardiac pacemaker: Secondary | ICD-10-CM | POA: Diagnosis not present

## 2024-08-08 DIAGNOSIS — Z79899 Other long term (current) drug therapy: Secondary | ICD-10-CM | POA: Diagnosis not present

## 2024-08-08 NOTE — Patient Instructions (Signed)
 Medication Instructions:  Your physician recommends that you continue on your current medications as directed. Please refer to the Current Medication list given to you today.  *If you need a refill on your cardiac medications before your next appointment, please call your pharmacy*  Lab Work: None If you have labs (blood work) drawn today and your tests are completely normal, you will receive your results only by: MyChart Message (if you have MyChart) OR A paper copy in the mail If you have any lab test that is abnormal or we need to change your treatment, we will call you to review the results.  Testing/Procedures: None  Follow-Up: At [facility], you and your health needs are our priority.  As part of our continuing mission to provide you with exceptional heart care, our providers are all part of one team.  This team includes your primary Cardiologist (physician) and Advanced Practice Providers or APPs (Physician Assistants and Nurse Practitioners) who all work together to provide you with the care you need, when you need it.  Your next appointment:   9 month(s)  Provider:   Zoe Hinds, MD    We recommend signing up for the patient portal called "MyChart".  Sign up information is provided on this After Visit Summary.  MyChart is used to connect with patients for Virtual Visits (Telemedicine).  Patients are able to view lab/test results, encounter notes, upcoming appointments, etc.  Non-urgent messages can be sent to your provider as well.   To learn more about what you can do with MyChart, go to [email or web address].   Other Instructions None

## 2024-08-09 ENCOUNTER — Ambulatory Visit: Payer: Self-pay | Admitting: Cardiology

## 2024-08-09 LAB — CUP PACEART REMOTE DEVICE CHECK
Battery Remaining Longevity: 30 mo
Battery Remaining Percentage: 36 %
Battery Voltage: 2.95 V
Brady Statistic AP VP Percent: 72 %
Brady Statistic AP VS Percent: 1 %
Brady Statistic AS VP Percent: 28 %
Brady Statistic AS VS Percent: 1 %
Brady Statistic RA Percent Paced: 71 %
Brady Statistic RV Percent Paced: 99 %
Date Time Interrogation Session: 20260105054733
Implantable Lead Connection Status: 753985
Implantable Lead Connection Status: 753985
Implantable Lead Implant Date: 20200930
Implantable Lead Implant Date: 20200930
Implantable Lead Location: 753859
Implantable Lead Location: 753860
Implantable Pulse Generator Implant Date: 20200930
Lead Channel Impedance Value: 350 Ohm
Lead Channel Impedance Value: 550 Ohm
Lead Channel Pacing Threshold Amplitude: 0.75 V
Lead Channel Pacing Threshold Amplitude: 1.25 V
Lead Channel Pacing Threshold Pulse Width: 0.5 ms
Lead Channel Pacing Threshold Pulse Width: 0.8 ms
Lead Channel Sensing Intrinsic Amplitude: 1.5 mV
Lead Channel Sensing Intrinsic Amplitude: 4 mV
Lead Channel Setting Pacing Amplitude: 2 V
Lead Channel Setting Pacing Amplitude: 2.5 V
Lead Channel Setting Pacing Pulse Width: 0.8 ms
Lead Channel Setting Sensing Sensitivity: 2 mV
Pulse Gen Model: 2272
Pulse Gen Serial Number: 9159064

## 2024-08-10 NOTE — Progress Notes (Signed)
 Remote PPM Transmission

## 2024-08-16 ENCOUNTER — Other Ambulatory Visit: Payer: Self-pay | Admitting: Cardiology

## 2024-08-16 DIAGNOSIS — E782 Mixed hyperlipidemia: Secondary | ICD-10-CM

## 2024-08-16 NOTE — Telephone Encounter (Signed)
 Lipid Panel done on 05/15/24
